# Patient Record
Sex: Female | Born: 1940 | Race: White | Hispanic: No | Smoking: Former smoker
Health system: Southern US, Community
[De-identification: ages and names within clinical notes are randomized; demographics above are authoritative.]

## PROBLEM LIST (undated history)

## (undated) DIAGNOSIS — G629 Polyneuropathy, unspecified: Secondary | ICD-10-CM

## (undated) DIAGNOSIS — F329 Major depressive disorder, single episode, unspecified: Secondary | ICD-10-CM

## (undated) DIAGNOSIS — C4491 Basal cell carcinoma of skin, unspecified: Secondary | ICD-10-CM

## (undated) DIAGNOSIS — E559 Vitamin D deficiency, unspecified: Secondary | ICD-10-CM

## (undated) DIAGNOSIS — F32A Depression, unspecified: Secondary | ICD-10-CM

## (undated) DIAGNOSIS — E039 Hypothyroidism, unspecified: Secondary | ICD-10-CM

## (undated) DIAGNOSIS — G250 Essential tremor: Secondary | ICD-10-CM

## (undated) DIAGNOSIS — F988 Other specified behavioral and emotional disorders with onset usually occurring in childhood and adolescence: Secondary | ICD-10-CM

## (undated) DIAGNOSIS — M8580 Other specified disorders of bone density and structure, unspecified site: Secondary | ICD-10-CM

## (undated) DIAGNOSIS — K5792 Diverticulitis of intestine, part unspecified, without perforation or abscess without bleeding: Secondary | ICD-10-CM

## (undated) HISTORY — PX: APPENDECTOMY: SHX54

## (undated) HISTORY — DX: Polyneuropathy, unspecified: G62.9

## (undated) HISTORY — DX: Hypothyroidism, unspecified: E03.9

## (undated) HISTORY — DX: Basal cell carcinoma of skin, unspecified: C44.91

## (undated) HISTORY — DX: Diverticulitis of intestine, part unspecified, without perforation or abscess without bleeding: K57.92

## (undated) HISTORY — DX: Vitamin D deficiency, unspecified: E55.9

## (undated) HISTORY — DX: Essential tremor: G25.0

## (undated) HISTORY — PX: BREAST BIOPSY: SHX20

## (undated) HISTORY — DX: Depression, unspecified: F32.A

## (undated) HISTORY — PX: CHOLECYSTECTOMY: SHX55

## (undated) HISTORY — DX: Other specified behavioral and emotional disorders with onset usually occurring in childhood and adolescence: F98.8

## (undated) HISTORY — DX: Other specified disorders of bone density and structure, unspecified site: M85.80

## (undated) HISTORY — PX: TONSILLECTOMY: SUR1361

## (undated) HISTORY — DX: Major depressive disorder, single episode, unspecified: F32.9

---
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
## 1983-09-11 HISTORY — PX: CHOLECYSTECTOMY: SHX55

## 1999-10-11 ENCOUNTER — Encounter: Admission: RE | Admit: 1999-10-11 | Discharge: 1999-10-11 | Payer: Self-pay | Admitting: Gynecology

## 1999-10-11 ENCOUNTER — Encounter: Payer: Self-pay | Admitting: Gynecology

## 2000-03-12 ENCOUNTER — Encounter: Payer: Self-pay | Admitting: Gynecology

## 2000-03-12 ENCOUNTER — Encounter: Admission: RE | Admit: 2000-03-12 | Discharge: 2000-03-12 | Payer: Self-pay | Admitting: Gynecology

## 2001-02-26 ENCOUNTER — Encounter: Admission: RE | Admit: 2001-02-26 | Discharge: 2001-02-26 | Payer: Self-pay | Admitting: Gynecology

## 2001-02-26 ENCOUNTER — Encounter: Payer: Self-pay | Admitting: Gynecology

## 2002-07-15 ENCOUNTER — Encounter: Payer: Self-pay | Admitting: Gynecology

## 2002-07-15 ENCOUNTER — Encounter: Admission: RE | Admit: 2002-07-15 | Discharge: 2002-07-15 | Payer: Self-pay | Admitting: Gynecology

## 2003-06-29 ENCOUNTER — Other Ambulatory Visit: Admission: RE | Admit: 2003-06-29 | Discharge: 2003-06-29 | Payer: Self-pay | Admitting: Gynecology

## 2003-10-01 ENCOUNTER — Encounter: Admission: RE | Admit: 2003-10-01 | Discharge: 2003-10-01 | Payer: Self-pay | Admitting: Gynecology

## 2004-05-24 ENCOUNTER — Ambulatory Visit (HOSPITAL_COMMUNITY): Admission: RE | Admit: 2004-05-24 | Discharge: 2004-05-24 | Payer: Self-pay | Admitting: Gastroenterology

## 2004-06-29 ENCOUNTER — Other Ambulatory Visit: Admission: RE | Admit: 2004-06-29 | Discharge: 2004-06-29 | Payer: Self-pay | Admitting: Gynecology

## 2004-10-02 ENCOUNTER — Other Ambulatory Visit: Admission: RE | Admit: 2004-10-02 | Discharge: 2004-10-02 | Payer: Self-pay | Admitting: Gynecology

## 2004-10-03 ENCOUNTER — Encounter: Admission: RE | Admit: 2004-10-03 | Discharge: 2004-10-03 | Payer: Self-pay | Admitting: Gynecology

## 2005-07-03 ENCOUNTER — Other Ambulatory Visit: Admission: RE | Admit: 2005-07-03 | Discharge: 2005-07-03 | Payer: Self-pay | Admitting: Gynecology

## 2005-10-04 ENCOUNTER — Encounter: Admission: RE | Admit: 2005-10-04 | Discharge: 2005-10-04 | Payer: Self-pay | Admitting: Gynecology

## 2006-10-25 ENCOUNTER — Encounter: Admission: RE | Admit: 2006-10-25 | Discharge: 2006-10-25 | Payer: Self-pay | Admitting: Gynecology

## 2007-05-26 ENCOUNTER — Emergency Department (HOSPITAL_COMMUNITY): Admission: EM | Admit: 2007-05-26 | Discharge: 2007-05-27 | Payer: Self-pay | Admitting: Emergency Medicine

## 2007-11-18 ENCOUNTER — Encounter: Admission: RE | Admit: 2007-11-18 | Discharge: 2007-11-18 | Payer: Self-pay | Admitting: Gynecology

## 2008-12-02 ENCOUNTER — Encounter: Admission: RE | Admit: 2008-12-02 | Discharge: 2008-12-02 | Payer: Self-pay | Admitting: Gynecology

## 2009-05-25 ENCOUNTER — Other Ambulatory Visit: Admission: RE | Admit: 2009-05-25 | Discharge: 2009-05-25 | Payer: Self-pay | Admitting: Gynecology

## 2009-05-25 ENCOUNTER — Ambulatory Visit: Payer: Self-pay | Admitting: Gynecology

## 2009-05-25 ENCOUNTER — Encounter: Payer: Self-pay | Admitting: Gynecology

## 2009-06-07 ENCOUNTER — Ambulatory Visit: Payer: Self-pay | Admitting: Gynecology

## 2009-08-19 ENCOUNTER — Ambulatory Visit: Payer: Self-pay | Admitting: Gynecology

## 2009-11-04 ENCOUNTER — Ambulatory Visit: Payer: Self-pay | Admitting: Gynecology

## 2009-12-05 ENCOUNTER — Encounter: Admission: RE | Admit: 2009-12-05 | Discharge: 2009-12-05 | Payer: Self-pay | Admitting: Gynecology

## 2010-10-01 ENCOUNTER — Encounter: Payer: Self-pay | Admitting: Gynecology

## 2010-12-20 ENCOUNTER — Other Ambulatory Visit: Payer: Self-pay | Admitting: Gynecology

## 2010-12-20 DIAGNOSIS — Z1231 Encounter for screening mammogram for malignant neoplasm of breast: Secondary | ICD-10-CM

## 2010-12-28 ENCOUNTER — Ambulatory Visit
Admission: RE | Admit: 2010-12-28 | Discharge: 2010-12-28 | Disposition: A | Discharge status: Home / Self Care | Payer: Medicare Other | Source: Ambulatory Visit | Attending: Gynecology | Admitting: Gynecology

## 2010-12-28 DIAGNOSIS — Z1231 Encounter for screening mammogram for malignant neoplasm of breast: Secondary | ICD-10-CM

## 2011-09-21 DIAGNOSIS — M545 Low back pain, unspecified: Secondary | ICD-10-CM | POA: Diagnosis not present

## 2011-09-21 DIAGNOSIS — E559 Vitamin D deficiency, unspecified: Secondary | ICD-10-CM | POA: Diagnosis not present

## 2011-09-21 DIAGNOSIS — F339 Major depressive disorder, recurrent, unspecified: Secondary | ICD-10-CM | POA: Diagnosis not present

## 2011-10-15 DIAGNOSIS — H2510 Age-related nuclear cataract, unspecified eye: Secondary | ICD-10-CM | POA: Diagnosis not present

## 2011-10-19 DIAGNOSIS — F331 Major depressive disorder, recurrent, moderate: Secondary | ICD-10-CM | POA: Diagnosis not present

## 2011-11-20 ENCOUNTER — Other Ambulatory Visit: Payer: Self-pay | Admitting: Gynecology

## 2011-11-20 DIAGNOSIS — Z1231 Encounter for screening mammogram for malignant neoplasm of breast: Secondary | ICD-10-CM

## 2011-12-12 DIAGNOSIS — F331 Major depressive disorder, recurrent, moderate: Secondary | ICD-10-CM | POA: Diagnosis not present

## 2011-12-31 ENCOUNTER — Ambulatory Visit
Admission: RE | Admit: 2011-12-31 | Discharge: 2011-12-31 | Disposition: A | Discharge status: Home / Self Care | Payer: PRIVATE HEALTH INSURANCE | Source: Ambulatory Visit | Attending: Gynecology | Admitting: Gynecology

## 2011-12-31 DIAGNOSIS — Z1231 Encounter for screening mammogram for malignant neoplasm of breast: Secondary | ICD-10-CM

## 2012-01-18 DIAGNOSIS — F331 Major depressive disorder, recurrent, moderate: Secondary | ICD-10-CM | POA: Diagnosis not present

## 2012-03-26 DIAGNOSIS — F331 Major depressive disorder, recurrent, moderate: Secondary | ICD-10-CM | POA: Diagnosis not present

## 2012-05-06 DIAGNOSIS — R110 Nausea: Secondary | ICD-10-CM | POA: Diagnosis not present

## 2012-07-16 DIAGNOSIS — F331 Major depressive disorder, recurrent, moderate: Secondary | ICD-10-CM | POA: Diagnosis not present

## 2012-07-23 DIAGNOSIS — Z23 Encounter for immunization: Secondary | ICD-10-CM | POA: Diagnosis not present

## 2012-07-27 DIAGNOSIS — R300 Dysuria: Secondary | ICD-10-CM | POA: Diagnosis not present

## 2012-07-27 DIAGNOSIS — N3289 Other specified disorders of bladder: Secondary | ICD-10-CM | POA: Diagnosis not present

## 2012-07-27 DIAGNOSIS — R350 Frequency of micturition: Secondary | ICD-10-CM | POA: Diagnosis not present

## 2012-08-14 DIAGNOSIS — F331 Major depressive disorder, recurrent, moderate: Secondary | ICD-10-CM | POA: Diagnosis not present

## 2012-10-06 ENCOUNTER — Encounter: Payer: Self-pay | Admitting: Gynecology

## 2012-10-06 DIAGNOSIS — G250 Essential tremor: Secondary | ICD-10-CM | POA: Insufficient documentation

## 2012-10-06 DIAGNOSIS — F331 Major depressive disorder, recurrent, moderate: Secondary | ICD-10-CM | POA: Insufficient documentation

## 2012-10-08 DIAGNOSIS — R209 Unspecified disturbances of skin sensation: Secondary | ICD-10-CM | POA: Diagnosis not present

## 2012-10-09 DIAGNOSIS — F331 Major depressive disorder, recurrent, moderate: Secondary | ICD-10-CM | POA: Diagnosis not present

## 2012-10-14 ENCOUNTER — Ambulatory Visit (INDEPENDENT_AMBULATORY_CARE_PROVIDER_SITE_OTHER): Payer: Medicare Other | Admitting: Gynecology

## 2012-10-14 ENCOUNTER — Encounter: Payer: Self-pay | Admitting: Gynecology

## 2012-10-14 VITALS — BP 120/70 | Ht 66.0 in | Wt 158.0 lb

## 2012-10-14 DIAGNOSIS — E559 Vitamin D deficiency, unspecified: Secondary | ICD-10-CM

## 2012-10-14 DIAGNOSIS — Z803 Family history of malignant neoplasm of breast: Secondary | ICD-10-CM

## 2012-10-14 DIAGNOSIS — N952 Postmenopausal atrophic vaginitis: Secondary | ICD-10-CM

## 2012-10-14 DIAGNOSIS — H35379 Puckering of macula, unspecified eye: Secondary | ICD-10-CM | POA: Diagnosis not present

## 2012-10-14 NOTE — Progress Notes (Signed)
Katrina Gamble November 06, 1940 [id number]        72 y.o.  G3P3003 for follow up exam.  Has not been in the office for a number of years.  Several issues noted below.  Past medical history,surgical history, medications, allergies, family history and social history were all reviewed and documented in the EPIC chart. ROS:  Was performed and pertinent positives and negatives are included in the history.  Exam: Kim assistant Filed Vitals:   10/14/12 1449  BP: 120/70  Height: 5\' 6"  (1.676 m)  Weight: 158 lb (71.668 kg)   General appearance  Normal Skin grossly normal Head/Neck normal with no cervical or supraclavicular adenopathy thyroid normal Lungs  clear Cardiac RR, without RMG Abdominal  soft, nontender, without masses, organomegaly or hernia Breasts  examined lying and sitting without masses, retractions, discharge or axillary adenopathy. Pelvic  Ext/BUS/vagina  normal with atrophic changes  Cervix  normal with atrophic changes  Uterus  axial, normal size, shape and contour, midline and mobile nontender   Adnexa  Without masses or tenderness    Anus and perineum  normal   Rectovaginal  normal sphincter tone without palpated masses or tenderness.    Assessment/Plan:  72 y.o. G3P3003 female for follow up exam.   1. Postmenopausal/atrophic vaginal changes. Patient's not sexually active. Not significantly symptomatic. Plan observation at present. 2. History of breast cancer and her mother age 30 and and her daughter age 26. Patient's daughter was checked for BRCA gene testing and was negative.  Mammogram 12/2011. Patient will schedule this coming April. SBE monthly reviewed. 3. Pap smear 2010. No Pap smear done today.  No history of significant abnormal Pap smears. Plan to stop screening per current screening guidelines and patients comfortable with this. 4. DEXA 2010 normal. Plan repeat next year at five-year interval. Increase calcium vitamin D reviewed. Patient has been known to be  vitamin D deficient with values checked in the 20 range. Recommend repeat vitamin D now patients could arrange at her primary physician's office who she has made an appointment to see this coming month. 5. Colonoscopy 2005. Recommend repeat this coming year patient agrees to do so. 6. Health maintenance. No lab work done as it's going to be all done at her primary physician's office. Follow up one year, sooner as needed.    Dara Lords MD, 3:23 PM 10/14/2012

## 2012-10-14 NOTE — Patient Instructions (Signed)
Follow-up in one year.

## 2012-10-30 ENCOUNTER — Other Ambulatory Visit: Payer: Self-pay | Admitting: Family Medicine

## 2012-10-30 DIAGNOSIS — M79604 Pain in right leg: Secondary | ICD-10-CM

## 2012-10-30 DIAGNOSIS — M545 Low back pain, unspecified: Secondary | ICD-10-CM

## 2012-10-30 DIAGNOSIS — R202 Paresthesia of skin: Secondary | ICD-10-CM

## 2012-11-06 ENCOUNTER — Ambulatory Visit
Admission: RE | Admit: 2012-11-06 | Discharge: 2012-11-06 | Disposition: A | Discharge status: Home / Self Care | Payer: Medicare Other | Source: Ambulatory Visit | Attending: Family Medicine | Admitting: Family Medicine

## 2012-11-06 DIAGNOSIS — M48061 Spinal stenosis, lumbar region without neurogenic claudication: Secondary | ICD-10-CM | POA: Diagnosis not present

## 2012-11-06 DIAGNOSIS — Z0000 Encounter for general adult medical examination without abnormal findings: Secondary | ICD-10-CM | POA: Diagnosis not present

## 2012-11-06 DIAGNOSIS — IMO0002 Reserved for concepts with insufficient information to code with codable children: Secondary | ICD-10-CM | POA: Diagnosis not present

## 2012-11-06 DIAGNOSIS — J309 Allergic rhinitis, unspecified: Secondary | ICD-10-CM | POA: Diagnosis not present

## 2012-11-06 DIAGNOSIS — F339 Major depressive disorder, recurrent, unspecified: Secondary | ICD-10-CM | POA: Diagnosis not present

## 2012-11-06 DIAGNOSIS — Z79899 Other long term (current) drug therapy: Secondary | ICD-10-CM | POA: Diagnosis not present

## 2012-11-06 DIAGNOSIS — E559 Vitamin D deficiency, unspecified: Secondary | ICD-10-CM | POA: Diagnosis not present

## 2012-11-06 DIAGNOSIS — M5126 Other intervertebral disc displacement, lumbar region: Secondary | ICD-10-CM | POA: Diagnosis not present

## 2012-11-06 DIAGNOSIS — M545 Low back pain, unspecified: Secondary | ICD-10-CM

## 2012-11-06 DIAGNOSIS — M4120 Other idiopathic scoliosis, site unspecified: Secondary | ICD-10-CM | POA: Diagnosis not present

## 2012-11-06 DIAGNOSIS — R202 Paresthesia of skin: Secondary | ICD-10-CM

## 2012-11-06 DIAGNOSIS — Z1331 Encounter for screening for depression: Secondary | ICD-10-CM | POA: Diagnosis not present

## 2012-11-06 DIAGNOSIS — H6120 Impacted cerumen, unspecified ear: Secondary | ICD-10-CM | POA: Diagnosis not present

## 2012-11-06 DIAGNOSIS — M79604 Pain in right leg: Secondary | ICD-10-CM

## 2012-11-10 DIAGNOSIS — J309 Allergic rhinitis, unspecified: Secondary | ICD-10-CM | POA: Diagnosis not present

## 2012-11-10 DIAGNOSIS — Z79899 Other long term (current) drug therapy: Secondary | ICD-10-CM | POA: Diagnosis not present

## 2012-11-10 DIAGNOSIS — R300 Dysuria: Secondary | ICD-10-CM | POA: Diagnosis not present

## 2012-11-10 DIAGNOSIS — E559 Vitamin D deficiency, unspecified: Secondary | ICD-10-CM | POA: Diagnosis not present

## 2012-11-12 DIAGNOSIS — M47817 Spondylosis without myelopathy or radiculopathy, lumbosacral region: Secondary | ICD-10-CM | POA: Diagnosis not present

## 2012-11-12 DIAGNOSIS — M5137 Other intervertebral disc degeneration, lumbosacral region: Secondary | ICD-10-CM | POA: Diagnosis not present

## 2012-12-02 ENCOUNTER — Other Ambulatory Visit: Payer: Self-pay

## 2012-12-02 DIAGNOSIS — Z1231 Encounter for screening mammogram for malignant neoplasm of breast: Secondary | ICD-10-CM

## 2012-12-25 DIAGNOSIS — F331 Major depressive disorder, recurrent, moderate: Secondary | ICD-10-CM | POA: Diagnosis not present

## 2012-12-31 ENCOUNTER — Ambulatory Visit
Admission: RE | Admit: 2012-12-31 | Discharge: 2012-12-31 | Disposition: A | Discharge status: Home / Self Care | Payer: Medicare Other | Source: Ambulatory Visit

## 2012-12-31 DIAGNOSIS — Z1231 Encounter for screening mammogram for malignant neoplasm of breast: Secondary | ICD-10-CM | POA: Diagnosis not present

## 2013-02-19 DIAGNOSIS — F331 Major depressive disorder, recurrent, moderate: Secondary | ICD-10-CM | POA: Diagnosis not present

## 2013-04-14 DIAGNOSIS — F331 Major depressive disorder, recurrent, moderate: Secondary | ICD-10-CM | POA: Diagnosis not present

## 2013-05-26 DIAGNOSIS — J069 Acute upper respiratory infection, unspecified: Secondary | ICD-10-CM | POA: Diagnosis not present

## 2013-06-26 DIAGNOSIS — Z23 Encounter for immunization: Secondary | ICD-10-CM | POA: Diagnosis not present

## 2013-07-07 DIAGNOSIS — F331 Major depressive disorder, recurrent, moderate: Secondary | ICD-10-CM | POA: Diagnosis not present

## 2013-08-16 DIAGNOSIS — R300 Dysuria: Secondary | ICD-10-CM | POA: Diagnosis not present

## 2013-08-26 DIAGNOSIS — M25539 Pain in unspecified wrist: Secondary | ICD-10-CM | POA: Diagnosis not present

## 2013-09-22 DIAGNOSIS — F331 Major depressive disorder, recurrent, moderate: Secondary | ICD-10-CM | POA: Diagnosis not present

## 2013-10-15 DIAGNOSIS — H2510 Age-related nuclear cataract, unspecified eye: Secondary | ICD-10-CM | POA: Diagnosis not present

## 2013-11-04 ENCOUNTER — Ambulatory Visit (INDEPENDENT_AMBULATORY_CARE_PROVIDER_SITE_OTHER): Payer: Medicare Other | Admitting: Gynecology

## 2013-11-04 ENCOUNTER — Encounter: Payer: Self-pay | Admitting: Gynecology

## 2013-11-04 VITALS — BP 116/70 | Ht 65.0 in | Wt 144.0 lb

## 2013-11-04 DIAGNOSIS — N9489 Other specified conditions associated with female genital organs and menstrual cycle: Secondary | ICD-10-CM

## 2013-11-04 DIAGNOSIS — N898 Other specified noninflammatory disorders of vagina: Secondary | ICD-10-CM

## 2013-11-04 DIAGNOSIS — E559 Vitamin D deficiency, unspecified: Secondary | ICD-10-CM

## 2013-11-04 DIAGNOSIS — Z803 Family history of malignant neoplasm of breast: Secondary | ICD-10-CM

## 2013-11-04 DIAGNOSIS — N952 Postmenopausal atrophic vaginitis: Secondary | ICD-10-CM

## 2013-11-04 NOTE — Progress Notes (Signed)
Katrina Gamble 02-04-1941 [id number]        72 y.o.  G3P3003 for followup exam.  Overall doing well with several issues that are below.  Past medical history,surgical history, problem list, medications, allergies, family history and social history were all reviewed and documented in the EPIC chart.  ROS:  Performed and pertinent positives and negatives are included in the history, assessment and plan .  Exam: Kim assistant Filed Vitals:   11/04/13 1137  BP: 116/70  Height: $Remove'5\' 5"'MFMWAEG$  (1.651 m)  Weight: 144 lb (65.318 kg)   General appearance  Normal Skin grossly normal Head/Neck normal with no cervical or supraclavicular adenopathy thyroid normal Lungs  clear Cardiac RR, without RMG Abdominal  soft, nontender, without masses, organomegaly or hernia Breasts  examined lying and sitting without masses, retractions, discharge or axillary adenopathy. Pelvic  Ext/BUS/vagina with generalized atrophic changes  Cervix atrophic  Uterus anteverted, normal size, shape and contour, midline and mobile nontender   Adnexa  Without masses or tenderness    Anus and perineum  Normal   Rectovaginal  Normal sphincter tone without palpated masses or tenderness.    Assessment/Plan:  73 y.o. G44P3003 female for followup exam.   1. Postmenopausal/atrophic genital changes/vaginal dryness. No bleeding significant hot flushes or night sweats. He is having issues with vaginal dryness. Options for OTC products reviewed. Not an issue that she wants to consider prescription medications. We'll monitor at present. Call if any vaginal bleeding. 2. Pap smear 2010. No Pap smear done today. No history of abnormal Pap smears previously. We have elected to stop screening per current screening guidelines as she is over the age of 69 and she is comfortable with this. 3. Mammography 2014. Repeat this coming April and due to. Mother and daughter with breast cancer. BRCA testing negative and her daughter. Continue with annual  mammography. SBE monthly reviewed. 4. DEXA fall of 2010 normal. Repeat next year at 5 year interval. Calcium/vitamin D recommendations reviewed. She is being followed for vitamin D deficiency by her primary physician and is on vitamin D supplements. 5. Colonoscopy due this year and she knows to call and arrange for this. 6. Health maintenance. No routine blood work done as it is all done through her primary physician's office. Followup one year, sooner as needed.   Note: This document was prepared with digital dictation and possible smart phrase technology. Any transcriptional errors that result from this process are unintentional.   [name] MD, 12:11 PM 11/04/2013

## 2013-11-04 NOTE — Patient Instructions (Signed)
Followup in one year for annual exam, sooner if any issues 

## 2013-11-17 DIAGNOSIS — F331 Major depressive disorder, recurrent, moderate: Secondary | ICD-10-CM | POA: Diagnosis not present

## 2013-12-03 ENCOUNTER — Other Ambulatory Visit: Payer: Self-pay

## 2013-12-03 DIAGNOSIS — Z1231 Encounter for screening mammogram for malignant neoplasm of breast: Secondary | ICD-10-CM

## 2014-01-04 ENCOUNTER — Encounter (INDEPENDENT_AMBULATORY_CARE_PROVIDER_SITE_OTHER): Payer: Self-pay

## 2014-01-04 ENCOUNTER — Ambulatory Visit
Admission: RE | Admit: 2014-01-04 | Discharge: 2014-01-04 | Disposition: A | Discharge status: Home / Self Care | Payer: Medicare Other | Source: Ambulatory Visit

## 2014-01-04 DIAGNOSIS — Z1231 Encounter for screening mammogram for malignant neoplasm of breast: Secondary | ICD-10-CM | POA: Diagnosis not present

## 2014-02-09 DIAGNOSIS — F331 Major depressive disorder, recurrent, moderate: Secondary | ICD-10-CM | POA: Diagnosis not present

## 2014-03-10 DIAGNOSIS — Z23 Encounter for immunization: Secondary | ICD-10-CM | POA: Diagnosis not present

## 2014-03-10 DIAGNOSIS — M25569 Pain in unspecified knee: Secondary | ICD-10-CM | POA: Diagnosis not present

## 2014-03-16 DIAGNOSIS — IMO0002 Reserved for concepts with insufficient information to code with codable children: Secondary | ICD-10-CM | POA: Diagnosis not present

## 2014-03-16 DIAGNOSIS — M25569 Pain in unspecified knee: Secondary | ICD-10-CM | POA: Diagnosis not present

## 2014-04-06 DIAGNOSIS — M25569 Pain in unspecified knee: Secondary | ICD-10-CM | POA: Diagnosis not present

## 2014-04-11 DIAGNOSIS — M1710 Unilateral primary osteoarthritis, unspecified knee: Secondary | ICD-10-CM | POA: Diagnosis not present

## 2014-06-01 DIAGNOSIS — F331 Major depressive disorder, recurrent, moderate: Secondary | ICD-10-CM | POA: Diagnosis not present

## 2014-06-22 DIAGNOSIS — Z23 Encounter for immunization: Secondary | ICD-10-CM | POA: Diagnosis not present

## 2014-07-12 ENCOUNTER — Encounter: Payer: Self-pay | Admitting: Gynecology

## 2014-07-27 DIAGNOSIS — F331 Major depressive disorder, recurrent, moderate: Secondary | ICD-10-CM | POA: Diagnosis not present

## 2014-08-27 DIAGNOSIS — R202 Paresthesia of skin: Secondary | ICD-10-CM | POA: Diagnosis not present

## 2014-08-27 DIAGNOSIS — E559 Vitamin D deficiency, unspecified: Secondary | ICD-10-CM | POA: Diagnosis not present

## 2014-08-27 DIAGNOSIS — R944 Abnormal results of kidney function studies: Secondary | ICD-10-CM | POA: Diagnosis not present

## 2014-09-15 DIAGNOSIS — R21 Rash and other nonspecific skin eruption: Secondary | ICD-10-CM | POA: Diagnosis not present

## 2014-10-14 DIAGNOSIS — R197 Diarrhea, unspecified: Secondary | ICD-10-CM | POA: Diagnosis not present

## 2014-10-14 DIAGNOSIS — Z1211 Encounter for screening for malignant neoplasm of colon: Secondary | ICD-10-CM | POA: Diagnosis not present

## 2014-10-18 DIAGNOSIS — H2513 Age-related nuclear cataract, bilateral: Secondary | ICD-10-CM | POA: Diagnosis not present

## 2014-10-19 DIAGNOSIS — F331 Major depressive disorder, recurrent, moderate: Secondary | ICD-10-CM | POA: Diagnosis not present

## 2014-11-05 ENCOUNTER — Encounter: Payer: Self-pay | Admitting: Gynecology

## 2014-11-05 ENCOUNTER — Ambulatory Visit (INDEPENDENT_AMBULATORY_CARE_PROVIDER_SITE_OTHER): Payer: Medicare Other | Admitting: Gynecology

## 2014-11-05 VITALS — BP 124/78 | Ht 66.0 in | Wt 145.0 lb

## 2014-11-05 DIAGNOSIS — E559 Vitamin D deficiency, unspecified: Secondary | ICD-10-CM | POA: Diagnosis not present

## 2014-11-05 DIAGNOSIS — N952 Postmenopausal atrophic vaginitis: Secondary | ICD-10-CM

## 2014-11-05 DIAGNOSIS — Z01419 Encounter for gynecological examination (general) (routine) without abnormal findings: Secondary | ICD-10-CM | POA: Diagnosis not present

## 2014-11-05 NOTE — Patient Instructions (Addendum)
Follow up for bone density as scheduled.  You may obtain a copy of any labs that were done today by logging onto MyChart as outlined in the instructions provided with your AVS (after visit summary). The office will not call with normal lab results but certainly if there are any significant abnormalities then we will contact you.   Health Maintenance, Female A healthy lifestyle and preventative care can promote health and wellness.  Maintain regular health, dental, and eye exams.  Eat a healthy diet. Foods like vegetables, fruits, whole grains, low-fat dairy products, and lean protein foods contain the nutrients you need without too many calories. Decrease your intake of foods high in solid fats, added sugars, and salt. Get information about a proper diet from your caregiver, if necessary.  Regular physical exercise is one of the most important things you can do for your health. Most adults should get at least 150 minutes of moderate-intensity exercise (any activity that increases your heart rate and causes you to sweat) each week. In addition, most adults need muscle-strengthening exercises on 2 or more days a week.   Maintain a healthy weight. The body mass index (BMI) is a screening tool to identify possible weight problems. It provides an estimate of body fat based on height and weight. Your caregiver can help determine your BMI, and can help you achieve or maintain a healthy weight. For adults 20 years and older:  A BMI below 18.5 is considered underweight.  A BMI of 18.5 to 24.9 is normal.  A BMI of 25 to 29.9 is considered overweight.  A BMI of 30 and above is considered obese.  Maintain normal blood lipids and cholesterol by exercising and minimizing your intake of saturated fat. Eat a balanced diet with plenty of fruits and vegetables. Blood tests for lipids and cholesterol should begin at age 20 and be repeated every 5 years. If your lipid or cholesterol levels are high, you are over  50, or you are a high risk for heart disease, you may need your cholesterol levels checked more frequently.Ongoing high lipid and cholesterol levels should be treated with medicines if diet and exercise are not effective.  If you smoke, find out from your caregiver how to quit. If you do not use tobacco, do not start.  Lung cancer screening is recommended for adults aged 55 80 years who are at high risk for developing lung cancer because of a history of smoking. Yearly low-dose computed tomography (CT) is recommended for people who have at least a 30-pack-year history of smoking and are a current smoker or have quit within the past 15 years. A pack year of smoking is smoking an average of 1 pack of cigarettes a day for 1 year (for example: 1 pack a day for 30 years or 2 packs a day for 15 years). Yearly screening should continue until the smoker has stopped smoking for at least 15 years. Yearly screening should also be stopped for people who develop a health problem that would prevent them from having lung cancer treatment.  If you are pregnant, do not drink alcohol. If you are breastfeeding, be very cautious about drinking alcohol. If you are not pregnant and choose to drink alcohol, do not exceed 1 drink per day. One drink is considered to be 12 ounces (355 mL) of beer, 5 ounces (148 mL) of wine, or 1.5 ounces (44 mL) of liquor.  Avoid use of street drugs. Do not share needles with anyone. Ask for help   help if you need support or instructions about stopping the use of drugs.  High blood pressure causes heart disease and increases the risk of stroke. Blood pressure should be checked at least every 1 to 2 years. Ongoing high blood pressure should be treated with medicines, if weight loss and exercise are not effective.  If you are 55 to 72 years old, ask your caregiver if you should take aspirin to prevent strokes.  Diabetes screening involves taking a blood sample to check your fasting blood sugar  level. This should be done once every 3 years, after age 45, if you are within normal weight and without risk factors for diabetes. Testing should be considered at a younger age or be carried out more frequently if you are overweight and have at least 1 risk factor for diabetes.  Breast cancer screening is essential preventative care for women. You should practice "breast self-awareness." This means understanding the normal appearance and feel of your breasts and may include breast self-examination. Any changes detected, no matter how small, should be reported to a caregiver. Women in their 20s and 30s should have a clinical breast exam (CBE) by a caregiver as part of a regular health exam every 1 to 3 years. After age 40, women should have a CBE every year. Starting at age 40, women should consider having a mammogram (breast X-ray) every year. Women who have a family history of breast cancer should talk to their caregiver about genetic screening. Women at a high risk of breast cancer should talk to their caregiver about having an MRI and a mammogram every year.  Breast cancer gene (BRCA)-related cancer risk assessment is recommended for women who have family members with BRCA-related cancers. BRCA-related cancers include breast, ovarian, tubal, and peritoneal cancers. Having family members with these cancers may be associated with an increased risk for harmful changes (mutations) in the breast cancer genes BRCA1 and BRCA2. Results of the assessment will determine the need for genetic counseling and BRCA1 and BRCA2 testing.  The Pap test is a screening test for cervical cancer. Women should have a Pap test starting at age 21. Between ages 21 and 29, Pap tests should be repeated every 2 years. Beginning at age 30, you should have a Pap test every 3 years as long as the past 3 Pap tests have been normal. If you had a hysterectomy for a problem that was not cancer or a condition that could lead to cancer, then  you no longer need Pap tests. If you are between ages 65 and 70, and you have had normal Pap tests going back 10 years, you no longer need Pap tests. If you have had past treatment for cervical cancer or a condition that could lead to cancer, you need Pap tests and screening for cancer for at least 20 years after your treatment. If Pap tests have been discontinued, risk factors (such as a new sexual partner) need to be reassessed to determine if screening should be resumed. Some women have medical problems that increase the chance of getting cervical cancer. In these cases, your caregiver may recommend more frequent screening and Pap tests.  The human papillomavirus (HPV) test is an additional test that may be used for cervical cancer screening. The HPV test looks for the virus that can cause the cell changes on the cervix. The cells collected during the Pap test can be tested for HPV. The HPV test could be used to screen women aged 30 years and older,   and should be used in women of any age who have unclear Pap test results. After the age of 30, women should have HPV testing at the same frequency as a Pap test.  Colorectal cancer can be detected and often prevented. Most routine colorectal cancer screening begins at the age of 50 and continues through age 75. However, your caregiver may recommend screening at an earlier age if you have risk factors for colon cancer. On a yearly basis, your caregiver may provide home test kits to check for hidden blood in the stool. Use of a small camera at the end of a tube, to directly examine the colon (sigmoidoscopy or colonoscopy), can detect the earliest forms of colorectal cancer. Talk to your caregiver about this at age 50, when routine screening begins. Direct examination of the colon should be repeated every 5 to 10 years through age 75, unless early forms of pre-cancerous polyps or small growths are found.  Hepatitis C blood testing is recommended for all people born  from 1945 through 1965 and any individual with known risks for hepatitis C.  Practice safe sex. Use condoms and avoid high-risk sexual practices to reduce the spread of sexually transmitted infections (STIs). Sexually active women aged 25 and younger should be checked for Chlamydia, which is a common sexually transmitted infection. Older women with new or multiple partners should also be tested for Chlamydia. Testing for other STIs is recommended if you are sexually active and at increased risk.  Osteoporosis is a disease in which the bones lose minerals and strength with aging. This can result in serious bone fractures. The risk of osteoporosis can be identified using a bone density scan. Women ages 65 and over and women at risk for fractures or osteoporosis should discuss screening with their caregivers. Ask your caregiver whether you should be taking a calcium supplement or vitamin D to reduce the rate of osteoporosis.  Menopause can be associated with physical symptoms and risks. Hormone replacement therapy is available to decrease symptoms and risks. You should talk to your caregiver about whether hormone replacement therapy is right for you.  Use sunscreen. Apply sunscreen liberally and repeatedly throughout the day. You should seek shade when your shadow is shorter than you. Protect yourself by wearing long sleeves, pants, a wide-brimmed hat, and sunglasses year round, whenever you are outdoors.  Notify your caregiver of new moles or changes in moles, especially if there is a change in shape or color. Also notify your caregiver if a mole is larger than the size of a pencil eraser.  Stay current with your immunizations. Document Released: 03/12/2011 Document Revised: 12/22/2012 Document Reviewed: 03/12/2011 [employer] Patient Information 2014 [employer].   

## 2014-11-05 NOTE — Progress Notes (Signed)
FRYDA MOLENDA March 24, 1941 [id number]        74 y.o.  G3P3003 for for breast and pelvic exam. Several issues noted below.  Past medical history,surgical history, problem list, medications, allergies, family history and social history were all reviewed and documented as reviewed in the EPIC chart.  ROS:  Performed with pertinent positives and negatives included in the history, assessment and plan.   Additional significant findings :  none   Exam: Kim Counsellor Vitals:   11/05/14 1029  BP: 124/78  Height: 5\' 6"  (1.676 m)  Weight: 145 lb (65.772 kg)   General appearance:  Normal affect, orientation and appearance. Skin: Grossly normal HEENT: Without gross lesions.  No cervical or supraclavicular adenopathy. Thyroid normal.  Lungs:  Clear without wheezing, rales or rhonchi Cardiac: RR, without RMG Abdominal:  Soft, nontender, without masses, guarding, rebound, organomegaly or hernia Breasts:  Examined lying and sitting without masses, retractions, discharge or axillary adenopathy. Pelvic:  Ext/BUS/vagina with generalized atrophic changes.  Cervix with atrophic changes.  Uterus axial to anteverted, normal size, shape and contour, midline and mobile nontender   Adnexa  Without masses or tenderness    Anus and perineum  Normal   Rectovaginal  Normal sphincter tone without palpated masses or tenderness.    Assessment/Plan:  74 y.o. G33P3003 female for annual exam.   1. Postmenopausal/atrophic genital changes. Patient without significant symptoms of hot flushes, night sweats, vaginal dryness. No vaginal bleeding. Continue monitoring report any vaginal bleeding. 2. Mammography 12/2013. Reminded patient to schedule this coming April. SBE monthly reviewed. 3. DEXA 2010 normal. Plan repeat DEXA now at 6 year interval. Has been treated for vitamin D deficiency with 50,000 units weekly and now is on 3000 units. Being followed by her primary for this. 4. Colonoscopy 2010. Repeat at  their recommended interval. 5. Pap smear 2010. No Pap smear done today. No history of abnormal Pap smears previously. Her current screening guidelines we both agreed to stop screening as she is over the age of 74. 68. Health maintenance. No routine blood work done as she reports this done at her primary physician's office. Follow up in one year, sooner as needed.     [name] MD, 11:06 AM 11/05/2014

## 2014-11-06 LAB — URINALYSIS W MICROSCOPIC + REFLEX CULTURE
Bacteria, UA: NONE SEEN
Bilirubin Urine: NEGATIVE
Casts: NONE SEEN
Crystals: NONE SEEN
Glucose, UA: NEGATIVE mg/dL
Hgb urine dipstick: NEGATIVE
Ketones, ur: NEGATIVE mg/dL
Leukocytes, UA: NEGATIVE
Nitrite: NEGATIVE
Protein, ur: NEGATIVE mg/dL
Specific Gravity, Urine: <1.005 — ABNORMAL LOW (ref 1.005–1.030)
Squamous Epithelial / LPF: NONE SEEN
Urobilinogen, UA: 0.2 mg/dL (ref 0.0–1.0)
pH: 6.5 (ref 5.0–8.0)

## 2014-11-09 DIAGNOSIS — M8580 Other specified disorders of bone density and structure, unspecified site: Secondary | ICD-10-CM

## 2014-11-09 HISTORY — DX: Other specified disorders of bone density and structure, unspecified site: M85.80

## 2014-11-16 ENCOUNTER — Encounter: Payer: Self-pay | Admitting: Gynecology

## 2014-11-16 ENCOUNTER — Ambulatory Visit (INDEPENDENT_AMBULATORY_CARE_PROVIDER_SITE_OTHER): Payer: Medicare Other

## 2014-11-16 ENCOUNTER — Other Ambulatory Visit: Payer: Self-pay | Admitting: Gynecology

## 2014-11-16 DIAGNOSIS — Z780 Asymptomatic menopausal state: Secondary | ICD-10-CM | POA: Diagnosis not present

## 2014-11-16 DIAGNOSIS — M8580 Other specified disorders of bone density and structure, unspecified site: Secondary | ICD-10-CM

## 2014-11-16 DIAGNOSIS — N952 Postmenopausal atrophic vaginitis: Secondary | ICD-10-CM | POA: Diagnosis not present

## 2014-11-16 DIAGNOSIS — Z01419 Encounter for gynecological examination (general) (routine) without abnormal findings: Secondary | ICD-10-CM

## 2014-11-29 DIAGNOSIS — D124 Benign neoplasm of descending colon: Secondary | ICD-10-CM | POA: Diagnosis not present

## 2014-11-29 DIAGNOSIS — K635 Polyp of colon: Secondary | ICD-10-CM | POA: Diagnosis not present

## 2014-11-29 DIAGNOSIS — K5730 Diverticulosis of large intestine without perforation or abscess without bleeding: Secondary | ICD-10-CM | POA: Diagnosis not present

## 2014-11-29 DIAGNOSIS — Z1211 Encounter for screening for malignant neoplasm of colon: Secondary | ICD-10-CM | POA: Diagnosis not present

## 2014-11-29 DIAGNOSIS — K648 Other hemorrhoids: Secondary | ICD-10-CM | POA: Diagnosis not present

## 2014-11-29 DIAGNOSIS — D125 Benign neoplasm of sigmoid colon: Secondary | ICD-10-CM | POA: Diagnosis not present

## 2015-01-26 ENCOUNTER — Other Ambulatory Visit: Payer: Self-pay

## 2015-01-26 DIAGNOSIS — Z1231 Encounter for screening mammogram for malignant neoplasm of breast: Secondary | ICD-10-CM

## 2015-02-01 ENCOUNTER — Ambulatory Visit
Admission: RE | Admit: 2015-02-01 | Discharge: 2015-02-01 | Disposition: A | Discharge status: Home / Self Care | Payer: Medicare Other | Source: Ambulatory Visit

## 2015-02-01 DIAGNOSIS — Z1231 Encounter for screening mammogram for malignant neoplasm of breast: Secondary | ICD-10-CM

## 2015-02-09 DIAGNOSIS — F331 Major depressive disorder, recurrent, moderate: Secondary | ICD-10-CM | POA: Diagnosis not present

## 2015-06-04 DIAGNOSIS — R40241 Glasgow coma scale score 13-15: Secondary | ICD-10-CM | POA: Diagnosis not present

## 2015-06-04 DIAGNOSIS — K7689 Other specified diseases of liver: Secondary | ICD-10-CM | POA: Diagnosis not present

## 2015-06-04 DIAGNOSIS — Z91040 Latex allergy status: Secondary | ICD-10-CM | POA: Diagnosis not present

## 2015-06-04 DIAGNOSIS — R1013 Epigastric pain: Secondary | ICD-10-CM | POA: Diagnosis not present

## 2015-06-04 DIAGNOSIS — S065X9A Traumatic subdural hemorrhage with loss of consciousness of unspecified duration, initial encounter: Secondary | ICD-10-CM | POA: Diagnosis not present

## 2015-06-04 DIAGNOSIS — S199XXA Unspecified injury of neck, initial encounter: Secondary | ICD-10-CM | POA: Diagnosis not present

## 2015-06-04 DIAGNOSIS — S065X0A Traumatic subdural hemorrhage without loss of consciousness, initial encounter: Secondary | ICD-10-CM | POA: Diagnosis not present

## 2015-06-04 DIAGNOSIS — S2220XA Unspecified fracture of sternum, initial encounter for closed fracture: Secondary | ICD-10-CM | POA: Diagnosis not present

## 2015-06-04 DIAGNOSIS — J9811 Atelectasis: Secondary | ICD-10-CM | POA: Diagnosis not present

## 2015-06-04 DIAGNOSIS — S299XXA Unspecified injury of thorax, initial encounter: Secondary | ICD-10-CM | POA: Diagnosis not present

## 2015-06-04 DIAGNOSIS — F909 Attention-deficit hyperactivity disorder, unspecified type: Secondary | ICD-10-CM | POA: Diagnosis present

## 2015-06-04 DIAGNOSIS — S20219A Contusion of unspecified front wall of thorax, initial encounter: Secondary | ICD-10-CM | POA: Diagnosis not present

## 2015-06-04 DIAGNOSIS — Z79899 Other long term (current) drug therapy: Secondary | ICD-10-CM | POA: Diagnosis not present

## 2015-06-04 DIAGNOSIS — S2222XA Fracture of body of sternum, initial encounter for closed fracture: Secondary | ICD-10-CM | POA: Diagnosis not present

## 2015-06-04 DIAGNOSIS — Z888 Allergy status to other drugs, medicaments and biological substances status: Secondary | ICD-10-CM | POA: Diagnosis not present

## 2015-06-04 DIAGNOSIS — F329 Major depressive disorder, single episode, unspecified: Secondary | ICD-10-CM | POA: Diagnosis not present

## 2015-06-04 DIAGNOSIS — N281 Cyst of kidney, acquired: Secondary | ICD-10-CM | POA: Diagnosis not present

## 2015-06-04 DIAGNOSIS — R51 Headache: Secondary | ICD-10-CM | POA: Diagnosis not present

## 2015-06-04 DIAGNOSIS — S80811A Abrasion, right lower leg, initial encounter: Secondary | ICD-10-CM | POA: Diagnosis not present

## 2015-06-04 DIAGNOSIS — I6200 Nontraumatic subdural hemorrhage, unspecified: Secondary | ICD-10-CM | POA: Diagnosis not present

## 2015-06-04 DIAGNOSIS — R938 Abnormal findings on diagnostic imaging of other specified body structures: Secondary | ICD-10-CM | POA: Diagnosis not present

## 2015-06-16 DIAGNOSIS — Z23 Encounter for immunization: Secondary | ICD-10-CM | POA: Diagnosis not present

## 2015-06-16 DIAGNOSIS — S2220XA Unspecified fracture of sternum, initial encounter for closed fracture: Secondary | ICD-10-CM | POA: Diagnosis not present

## 2015-06-16 DIAGNOSIS — I6200 Nontraumatic subdural hemorrhage, unspecified: Secondary | ICD-10-CM | POA: Diagnosis not present

## 2015-06-21 ENCOUNTER — Other Ambulatory Visit: Payer: Self-pay | Admitting: Family Medicine

## 2015-06-21 DIAGNOSIS — S065X9A Traumatic subdural hemorrhage with loss of consciousness of unspecified duration, initial encounter: Secondary | ICD-10-CM

## 2015-06-21 DIAGNOSIS — S065XAA Traumatic subdural hemorrhage with loss of consciousness status unknown, initial encounter: Secondary | ICD-10-CM

## 2015-06-27 ENCOUNTER — Ambulatory Visit
Admission: RE | Admit: 2015-06-27 | Discharge: 2015-06-27 | Disposition: A | Discharge status: Home / Self Care | Payer: Medicare Other | Source: Ambulatory Visit | Attending: Family Medicine | Admitting: Family Medicine

## 2015-06-27 DIAGNOSIS — S065XAA Traumatic subdural hemorrhage with loss of consciousness status unknown, initial encounter: Secondary | ICD-10-CM

## 2015-06-27 DIAGNOSIS — S065X9A Traumatic subdural hemorrhage with loss of consciousness of unspecified duration, initial encounter: Secondary | ICD-10-CM

## 2015-06-27 DIAGNOSIS — S0990XA Unspecified injury of head, initial encounter: Secondary | ICD-10-CM | POA: Diagnosis not present

## 2015-06-29 DIAGNOSIS — F331 Major depressive disorder, recurrent, moderate: Secondary | ICD-10-CM | POA: Diagnosis not present

## 2015-08-24 DIAGNOSIS — F331 Major depressive disorder, recurrent, moderate: Secondary | ICD-10-CM | POA: Diagnosis not present

## 2015-11-07 ENCOUNTER — Ambulatory Visit (INDEPENDENT_AMBULATORY_CARE_PROVIDER_SITE_OTHER): Payer: Medicare Other | Admitting: Gynecology

## 2015-11-07 ENCOUNTER — Encounter: Payer: Self-pay | Admitting: Gynecology

## 2015-11-07 VITALS — BP 120/72 | Ht 66.0 in | Wt 140.0 lb

## 2015-11-07 DIAGNOSIS — M8580 Other specified disorders of bone density and structure, unspecified site: Secondary | ICD-10-CM | POA: Diagnosis not present

## 2015-11-07 DIAGNOSIS — Z01419 Encounter for gynecological examination (general) (routine) without abnormal findings: Secondary | ICD-10-CM

## 2015-11-07 DIAGNOSIS — N952 Postmenopausal atrophic vaginitis: Secondary | ICD-10-CM | POA: Diagnosis not present

## 2015-11-07 NOTE — Patient Instructions (Signed)

## 2015-11-07 NOTE — Progress Notes (Signed)
Katrina Gamble [birth year] [id number]        74 y.o.  G3P3003  for breast and pelvic exam.  Past medical history,surgical history, problem list, medications, allergies, family history and social history were all reviewed and documented as reviewed in the EPIC chart.  ROS:  Performed with pertinent positives and negatives included in the history, assessment and plan.   Additional significant findings :  none   Exam: [name] assistant Filed Vitals:   11/07/15 0926  BP: 120/72  Height: 5\' 6"  (1.676 m)  Weight: 140 lb (63.504 kg)   General appearance:  Normal affect, orientation and appearance. Skin: Grossly normal HEENT: Without gross lesions.  No cervical or supraclavicular adenopathy. Thyroid normal.  Lungs:  Clear without wheezing, rales or rhonchi Cardiac: RR, without RMG Abdominal:  Soft, nontender, without masses, guarding, rebound, organomegaly or hernia Breasts:  Examined lying and sitting without masses, retractions, discharge or axillary adenopathy. Pelvic:  Ext/BUS/vagina with atrophic changes  Cervix with atrophic changes  Uterus axial to anteverted, normal size, shape and contour, midline and mobile nontender   Adnexa without masses or tenderness    Anus and perineum normal   Rectovaginal normal sphincter tone without palpated masses or tenderness.    Assessment/Plan:  75 y.o. G56P3003 female for .   1. Postmenopausal/atrophic genital changes. Doing well without significant hot flushes, night sweats, vaginal dryness and bleeding. Continue to monitor and report any bleeding. 2. Osteopenia.  DEXA 11/2014 T score -1.2 FRAX 14%/2.9%. Plan repeat DEXA in 1-2 years. 3. Mammography 01/2015. Continue with annual mammography when due. SBE monthly. 4. Colonoscopy 2010. Reported repeat interval 10 years. 5. Pap smear 2010. No Pap smear done today. No history of abnormal Pap smears. Reviewed current screening guidelines and we both agree to stop screening based on  age. 6. Health maintenance. No routine lab work done as patient reports his done at her primary physician's office. Follow up 1 year, sooner as needed.   [name] MD, 9:49 AM 11/07/2015

## 2015-11-16 DIAGNOSIS — F331 Major depressive disorder, recurrent, moderate: Secondary | ICD-10-CM | POA: Diagnosis not present

## 2015-12-09 DIAGNOSIS — H2513 Age-related nuclear cataract, bilateral: Secondary | ICD-10-CM | POA: Diagnosis not present

## 2016-02-08 DIAGNOSIS — F331 Major depressive disorder, recurrent, moderate: Secondary | ICD-10-CM | POA: Diagnosis not present

## 2016-03-07 DIAGNOSIS — F331 Major depressive disorder, recurrent, moderate: Secondary | ICD-10-CM | POA: Diagnosis not present

## 2016-03-28 DIAGNOSIS — J309 Allergic rhinitis, unspecified: Secondary | ICD-10-CM | POA: Diagnosis not present

## 2016-03-28 DIAGNOSIS — Z87891 Personal history of nicotine dependence: Secondary | ICD-10-CM | POA: Diagnosis not present

## 2016-03-28 DIAGNOSIS — F339 Major depressive disorder, recurrent, unspecified: Secondary | ICD-10-CM | POA: Diagnosis not present

## 2016-03-28 DIAGNOSIS — N289 Disorder of kidney and ureter, unspecified: Secondary | ICD-10-CM | POA: Diagnosis not present

## 2016-03-28 DIAGNOSIS — R202 Paresthesia of skin: Secondary | ICD-10-CM | POA: Diagnosis not present

## 2016-03-28 DIAGNOSIS — Z0000 Encounter for general adult medical examination without abnormal findings: Secondary | ICD-10-CM | POA: Diagnosis not present

## 2016-03-28 DIAGNOSIS — E559 Vitamin D deficiency, unspecified: Secondary | ICD-10-CM | POA: Diagnosis not present

## 2016-03-28 DIAGNOSIS — Z79899 Other long term (current) drug therapy: Secondary | ICD-10-CM | POA: Diagnosis not present

## 2016-04-19 DIAGNOSIS — M2042 Other hammer toe(s) (acquired), left foot: Secondary | ICD-10-CM | POA: Diagnosis not present

## 2016-04-19 DIAGNOSIS — R202 Paresthesia of skin: Secondary | ICD-10-CM | POA: Diagnosis not present

## 2016-04-19 DIAGNOSIS — M2041 Other hammer toe(s) (acquired), right foot: Secondary | ICD-10-CM | POA: Diagnosis not present

## 2016-04-20 ENCOUNTER — Other Ambulatory Visit: Payer: Self-pay | Admitting: Gynecology

## 2016-04-20 DIAGNOSIS — Z1231 Encounter for screening mammogram for malignant neoplasm of breast: Secondary | ICD-10-CM

## 2016-05-02 DIAGNOSIS — F331 Major depressive disorder, recurrent, moderate: Secondary | ICD-10-CM | POA: Diagnosis not present

## 2016-05-03 ENCOUNTER — Ambulatory Visit
Admission: RE | Admit: 2016-05-03 | Discharge: 2016-05-03 | Disposition: A | Discharge status: Home / Self Care | Payer: Medicare Other | Source: Ambulatory Visit | Attending: Gynecology | Admitting: Gynecology

## 2016-05-03 DIAGNOSIS — Z1231 Encounter for screening mammogram for malignant neoplasm of breast: Secondary | ICD-10-CM

## 2016-06-18 DIAGNOSIS — R202 Paresthesia of skin: Secondary | ICD-10-CM | POA: Diagnosis not present

## 2016-06-18 DIAGNOSIS — M2041 Other hammer toe(s) (acquired), right foot: Secondary | ICD-10-CM | POA: Diagnosis not present

## 2016-06-18 DIAGNOSIS — M21611 Bunion of right foot: Secondary | ICD-10-CM | POA: Diagnosis not present

## 2016-07-28 DIAGNOSIS — Z23 Encounter for immunization: Secondary | ICD-10-CM | POA: Diagnosis not present

## 2016-08-22 DIAGNOSIS — F331 Major depressive disorder, recurrent, moderate: Secondary | ICD-10-CM | POA: Diagnosis not present

## 2016-09-21 DIAGNOSIS — R002 Palpitations: Secondary | ICD-10-CM | POA: Diagnosis not present

## 2016-09-24 ENCOUNTER — Telehealth: Payer: Self-pay

## 2016-09-24 NOTE — Telephone Encounter (Signed)
Sent note to scheduling 

## 2016-10-25 ENCOUNTER — Ambulatory Visit (INDEPENDENT_AMBULATORY_CARE_PROVIDER_SITE_OTHER): Payer: Medicare Other | Admitting: Internal Medicine

## 2016-10-25 ENCOUNTER — Encounter: Payer: Self-pay | Admitting: Internal Medicine

## 2016-10-25 VITALS — BP 124/70 | HR 72 | Ht 66.0 in | Wt 142.8 lb

## 2016-10-25 DIAGNOSIS — R002 Palpitations: Secondary | ICD-10-CM | POA: Diagnosis not present

## 2016-10-25 NOTE — Patient Instructions (Signed)
Medication Instructions:  Your physician recommends that you continue on your current medications as directed. Please refer to the Current Medication list given to you today.   Labwork: None   Testing/Procedures: Your physician has recommended that you wear a holter monitor. Holter monitors are medical devices that record the heart's electrical activity. Doctors most often use these monitors to diagnose arrhythmias. Arrhythmias are problems with the speed or rhythm of the heartbeat. The monitor is a small, portable device. You can wear one while you do your normal daily activities. This is usually used to diagnose what is causing palpitations/syncope (passing out).  31 HOUR  Follow-Up: Your physician recommends that you schedule a follow-up appointment in: 1 month with Dr End.    Try to limit your caffeine use, this can help your palpitations.  If you need a refill on your cardiac medications before your next appointment, please call your pharmacy.

## 2016-10-25 NOTE — Progress Notes (Signed)
New Outpatient Visit Date: 10/25/2016  Referring Provider: Hulan Fess, MD [place], [place] [address]  Chief Complaint: Palpitations  HPI:  Ms. Nedrow is a 76 y.o. year-old female with history of depression, anxiety, and essential tremor, who has been referred by Dr. Rex Kras for evaluation of palpitations. The patient reports several months of fluttering in the chest and occasional "flip-flop" sensation. This typically happens at night when she is watching television, though it has also occurred at other times. Episodes last a couple of seconds and can occur several times a night. She denies associated symptoms including chest pain, shortness of breath, and lightheadedness. She previously consumed at least 4 cups of caffeinated coffee per day, but has cut down over the last month at the recommendation of her PCP. She now drinks about 2 cups of caffeinated coffee per day and has noticed improvement in her palpitations. She has been on Ritalin and Viibryd prescribed by her psychiatrist for many years. The only medication change around the time that her symptoms and was an increase in the dose of N-acetylcysteine, that she is taking for memory preservation. She cannot think of any other precipitants.  The patient denies a history of prior cardiovascular disease. She underwent a stress test several years ago and states that it was normal.  --------------------------------------------------------------------------------------------------  Cardiovascular History & Procedures: Cardiovascular Problems:  Palpitations  Risk Factors:  Age greater than 65  Cath/PCI:  None  CV Surgery:  None  EP Procedures and Devices:  None  Non-Invasive Evaluation(s):  Stress test approximately 10 years ago: Normal per patient's report.  Recent CV Pertinent Labs: No results found for: CHOL, HDL, LDLCALC, LDLDIRECT, TRIG, CHOLHDL, INR, BNP, K, MG, BUN,  CREATININE  --------------------------------------------------------------------------------------------------  Past Medical History:  Diagnosis Date  . Depression   . Osteopenia 11/2014   T score -1.2 FRAX 14%/2.9%  . Tremor, essential     Past Surgical History:  Procedure Laterality Date  . APPENDECTOMY    . CHOLECYSTECTOMY    . TONSILLECTOMY      Outpatient Encounter Prescriptions as of 10/25/2016  Medication Sig  . b complex vitamins tablet Take 1 tablet by mouth daily.  . Cholecalciferol (VITAMIN D PO) Take by mouth.  Marland Kitchen FLUZONE HIGH-DOSE 0.5 ML SUSY Inject 0.5 mLs as directed once.  . methylphenidate (METADATE CD) 20 MG CR capsule Take 20 mg by mouth every morning.  . Vilazodone HCl (VIIBRYD PO) Take by mouth.   No facility-administered encounter medications on file as of 10/25/2016.     Allergies: Codeine; Latex; and Septra [sulfamethoxazole-trimethoprim]  Social History   Social History  . Marital status: Widowed    Spouse name: N/A  . Number of children: N/A  . Years of education: N/A   Occupational History  . Not on file.   Social History Main Topics  . Smoking status: Former Smoker    Packs/day: 0.50    Years: 30.00    Types: Cigarettes    Quit date: 2012  . Smokeless tobacco: Never Used  . Alcohol use No  . Drug use: No  . Sexual activity: No         Family History  Problem Relation Age of Onset  . Hypertension Mother   . Thyroid disease Mother   . Breast cancer Mother 37  . Breast cancer Daughter 40  . Thyroid disease Daughter   . Other Father     Brain tumor  . Parkinson's disease Father   . Dementia Father   .  Cancer Daughter     Melanoma  . Heart disease Paternal Grandfather     Review of Systems: Review of Systems  Constitutional: Positive for diaphoresis. Negative for chills and fever.  HENT: Negative.   Eyes: Negative.   Respiratory: Negative.   Cardiovascular: Positive for palpitations. Negative for chest pain, orthopnea,  claudication, leg swelling and PND.  Gastrointestinal: Negative.   Genitourinary: Negative.   Musculoskeletal: Negative.   Skin: Negative.   Neurological: Negative.   Endo/Heme/Allergies: Negative.   Psychiatric/Behavioral: Positive for depression.   --------------------------------------------------------------------------------------------------  Physical Exam: BP 124/70 (BP Location: Right Arm, Patient Position: Sitting, Cuff Size: Normal)   Pulse 72   Ht 5\' 6"  (1.676 m)   Wt 142 lb 12.8 oz (64.8 kg)   SpO2 99%   BMI 23.05 kg/m   General:  Well-developed, well-nourished woman seated comfortably in the exam room. HEENT: No conjunctival pallor or scleral icterus.  Moist mucous membranes.  OP clear. Neck: Supple without lymphadenopathy, thyromegaly, JVD, or HJR.  No carotid bruit. Lungs: Normal work of breathing.  Clear to auscultation bilaterally without wheezes or crackles. Heart: Regular rate and rhythm without murmurs, rubs, or gallops.  Non-displaced PMI. Abd: Bowel sounds present.  Soft, NT/ND without hepatosplenomegaly Ext: No lower extremity edema.  Radial, PT, and DP pulses are 2+ bilaterally Skin: warm and dry without rash Neuro: CNIII-XII intact.  Strength and fine-touch sensation intact in upper and lower extremities bilaterally. Psych: Normal mood and affect.  EKG:  Normal sinus rhythm with isolated PAC. Otherwise, no significant abnormalities. No prior tracing available for comparison.  Outside labs 03/28/16: WBC 4.2, Hgb 13.0, platelet 226  Sodium 139, potassium 3.9, chloride 102, CO2 31, BUN 17, creatinine 0.8, glucose 92, calcium 9.7, AST 22, ALT 16, alkaline phosphatase 56, total bilirubin 0.7, total protein 6.8, albumin 4.4  TSH 3.1  --------------------------------------------------------------------------------------------------  ASSESSMENT AND PLAN: Palpitations Symptoms are most suggestive of PACs or PVCs with possible brief runs of the arrhythmia.  She does not have any worrisome symptoms such as chest pain or lightheadedness. She has already noticed improvement with decreased caffeine intake. I encouraged her to cut back further if possible. Ideally, she should also avoid stimulants such as Ritalin. However, we will not make any changes to her medication at this time. We will obtain a 48-hour Holter monitor to better characterize potential arrhythmias. If any significant arrhythmias are noted, we will consider echo +/- stress test.  Follow-up: Return to clinic in one month  Nelva Bush, MD 10/25/2016 7:20 PM

## 2016-10-30 ENCOUNTER — Ambulatory Visit (INDEPENDENT_AMBULATORY_CARE_PROVIDER_SITE_OTHER): Payer: Medicare Other

## 2016-10-30 DIAGNOSIS — R002 Palpitations: Secondary | ICD-10-CM

## 2016-11-07 ENCOUNTER — Ambulatory Visit (INDEPENDENT_AMBULATORY_CARE_PROVIDER_SITE_OTHER): Payer: Medicare Other | Admitting: Gynecology

## 2016-11-07 ENCOUNTER — Encounter: Payer: Self-pay | Admitting: Gynecology

## 2016-11-07 VITALS — BP 122/74 | Ht 65.0 in | Wt 144.0 lb

## 2016-11-07 DIAGNOSIS — Z01411 Encounter for gynecological examination (general) (routine) with abnormal findings: Secondary | ICD-10-CM

## 2016-11-07 DIAGNOSIS — N952 Postmenopausal atrophic vaginitis: Secondary | ICD-10-CM

## 2016-11-07 DIAGNOSIS — M8580 Other specified disorders of bone density and structure, unspecified site: Secondary | ICD-10-CM

## 2016-11-07 NOTE — Patient Instructions (Signed)

## 2016-11-07 NOTE — Progress Notes (Signed)
    Katrina Gamble [birth year] [id number]        76 y.o.  G3P3003 for breast and pelvic exam.  Past medical history,surgical history, problem list, medications, allergies, family history and social history were all reviewed and documented as reviewed in the EPIC chart.  ROS:  Performed with pertinent positives and negatives included in the history, assessment and plan.   Additional significant findings :  None   Exam: Katrina Gamble assistant Vitals:   11/07/16 0900  BP: 122/74  Weight: 144 lb (65.3 kg)  Height: 5\' 5"  (1.651 m)   Body mass index is 23.96 kg/m.  General appearance:  Normal affect, orientation and appearance. Skin: Grossly normal HEENT: Without gross lesions.  No cervical or supraclavicular adenopathy. Thyroid normal.  Lungs:  Clear without wheezing, rales or rhonchi Cardiac: RR, without RMG Abdominal:  Soft, nontender, without masses, guarding, rebound, organomegaly or hernia Breasts:  Examined lying and sitting without masses, retractions, discharge or axillary adenopathy. Pelvic:  Ext, BUS, Vagina: With atrophic changes  Cervix: With atrophic changes  Uterus: Anteverted, normal size, shape and contour, midline and mobile nontender   Adnexa: Without masses or tenderness    Anus and perineum: Normal   Rectovaginal: Normal sphincter tone without palpated masses or tenderness.    Assessment/Plan:  76 y.o. G30P3003 female for breast and pelvic exam.   1. Postmenopausal/atrophic genital changes. No significant hot flushes, vaginal dryness or any vaginal bleeding. Continue to monitor and report any symptoms or vaginal bleeding. 2. Osteopenia. DEXA 11/2014 T score -1.2 FRAX 14%/2.9%. Plan repeat DEXA in another year or 2. Increased calcium vitamin D. 3. Colonoscopy 2010 with reported repeat interval 10 years. 4. Pap smear 2010. No Pap smear done today. No history of abnormal Pap smears. Per current screening guidelines we both agree to stop  screening. 5. Mammography 04/2016. Continue with annual mammography when due. SBE monthly reviewed. 6. Health maintenance. No routine lab work done as patient reports this done elsewhere. Follow up 1 year, sooner as needed.   [name] MD, 9:21 AM 11/07/2016

## 2016-11-15 ENCOUNTER — Telehealth: Payer: Self-pay | Admitting: Internal Medicine

## 2016-11-15 NOTE — Telephone Encounter (Signed)
I left a message with the patient to contact the [employer] office to discuss results of recent Holter monitor. This demonstrated PACs and short runs of SVT.  Nelva Bush, MD [facility] Pager: [phone]

## 2016-11-16 NOTE — Telephone Encounter (Signed)
I spoke with the patient regarding the results of her recent Holter monitor, demonstrating PACs and PVCs as well as brief SVT lasting up to 5 beats. Her symptoms have been stable. We discussed medical therapy, but given that she is minimally symptomatic, she wishes to defer this. I asked her to contact us if her symptoms worsen or if new questions arise. It is reasonable to cancel her upcoming appointment with me.  Nelva Bush, MD [facility] Pager: [phone]

## 2016-11-16 NOTE — Telephone Encounter (Signed)
F/u Message  Pt returning Dr. Saunders Revel call about Holter monitor. Please call back to discuss

## 2016-11-30 ENCOUNTER — Ambulatory Visit: Payer: Medicare Other | Admitting: Internal Medicine

## 2016-12-11 DIAGNOSIS — H2513 Age-related nuclear cataract, bilateral: Secondary | ICD-10-CM | POA: Diagnosis not present

## 2016-12-12 DIAGNOSIS — F331 Major depressive disorder, recurrent, moderate: Secondary | ICD-10-CM | POA: Diagnosis not present

## 2017-03-06 DIAGNOSIS — F331 Major depressive disorder, recurrent, moderate: Secondary | ICD-10-CM | POA: Diagnosis not present

## 2017-04-19 DIAGNOSIS — E559 Vitamin D deficiency, unspecified: Secondary | ICD-10-CM | POA: Diagnosis not present

## 2017-04-19 DIAGNOSIS — H6121 Impacted cerumen, right ear: Secondary | ICD-10-CM | POA: Diagnosis not present

## 2017-04-19 DIAGNOSIS — Z0000 Encounter for general adult medical examination without abnormal findings: Secondary | ICD-10-CM | POA: Diagnosis not present

## 2017-04-19 DIAGNOSIS — J309 Allergic rhinitis, unspecified: Secondary | ICD-10-CM | POA: Diagnosis not present

## 2017-04-19 DIAGNOSIS — F339 Major depressive disorder, recurrent, unspecified: Secondary | ICD-10-CM | POA: Diagnosis not present

## 2017-04-19 DIAGNOSIS — H6120 Impacted cerumen, unspecified ear: Secondary | ICD-10-CM | POA: Diagnosis not present

## 2017-04-19 DIAGNOSIS — M8580 Other specified disorders of bone density and structure, unspecified site: Secondary | ICD-10-CM | POA: Diagnosis not present

## 2017-04-19 DIAGNOSIS — Z79899 Other long term (current) drug therapy: Secondary | ICD-10-CM | POA: Diagnosis not present

## 2017-04-24 DIAGNOSIS — F331 Major depressive disorder, recurrent, moderate: Secondary | ICD-10-CM | POA: Diagnosis not present

## 2017-06-14 ENCOUNTER — Other Ambulatory Visit: Payer: Self-pay | Admitting: Gynecology

## 2017-06-14 DIAGNOSIS — Z1231 Encounter for screening mammogram for malignant neoplasm of breast: Secondary | ICD-10-CM

## 2017-06-26 ENCOUNTER — Ambulatory Visit
Admission: RE | Admit: 2017-06-26 | Discharge: 2017-06-26 | Disposition: A | Discharge status: Home / Self Care | Payer: Medicare Other | Source: Ambulatory Visit | Attending: Gynecology | Admitting: Gynecology

## 2017-06-26 DIAGNOSIS — Z1231 Encounter for screening mammogram for malignant neoplasm of breast: Secondary | ICD-10-CM | POA: Diagnosis not present

## 2017-07-04 DIAGNOSIS — Z23 Encounter for immunization: Secondary | ICD-10-CM | POA: Diagnosis not present

## 2017-07-17 DIAGNOSIS — F331 Major depressive disorder, recurrent, moderate: Secondary | ICD-10-CM | POA: Diagnosis not present

## 2017-08-28 DIAGNOSIS — F331 Major depressive disorder, recurrent, moderate: Secondary | ICD-10-CM | POA: Diagnosis not present

## 2017-10-02 DIAGNOSIS — F331 Major depressive disorder, recurrent, moderate: Secondary | ICD-10-CM | POA: Diagnosis not present

## 2017-11-06 DIAGNOSIS — F331 Major depressive disorder, recurrent, moderate: Secondary | ICD-10-CM | POA: Diagnosis not present

## 2017-11-08 ENCOUNTER — Encounter: Payer: Self-pay | Admitting: Gynecology

## 2017-11-08 ENCOUNTER — Ambulatory Visit (INDEPENDENT_AMBULATORY_CARE_PROVIDER_SITE_OTHER): Payer: Medicare Other | Admitting: Gynecology

## 2017-11-08 VITALS — BP 120/76 | Temp 98.2°F | Ht 65.0 in | Wt 146.0 lb

## 2017-11-08 DIAGNOSIS — Z01411 Encounter for gynecological examination (general) (routine) with abnormal findings: Secondary | ICD-10-CM | POA: Diagnosis not present

## 2017-11-08 DIAGNOSIS — M8580 Other specified disorders of bone density and structure, unspecified site: Secondary | ICD-10-CM

## 2017-11-08 DIAGNOSIS — N952 Postmenopausal atrophic vaginitis: Secondary | ICD-10-CM

## 2017-11-08 NOTE — Progress Notes (Signed)
    Katrina Gamble 02-05-1941 [id number]        76 y.o.  G3P3003 for breast and pelvic exam.  Doing well without GYN complaints.  Past medical history,surgical history, problem list, medications, allergies, family history and social history were all reviewed and documented as reviewed in the EPIC chart.  ROS:  Performed with pertinent positives and negatives included in the history, assessment and plan.   Additional significant findings : None   Exam: [name] assistant Vitals:   11/08/17 0855  BP: 120/76  Temp: 98.2 F (36.8 C)  TempSrc: Oral  Weight: 146 lb (66.2 kg)  Height: 5\' 5"  (1.651 m)   Body mass index is 24.3 kg/m.  General appearance:  Normal affect, orientation and appearance. Skin: Grossly normal HEENT: Without gross lesions.  No cervical or supraclavicular adenopathy. Thyroid normal.  Lungs:  Clear without wheezing, rales or rhonchi Cardiac: RR, without RMG Abdominal:  Soft, nontender, without masses, guarding, rebound, organomegaly or hernia Breasts:  Examined lying and sitting without masses, retractions, discharge or axillary adenopathy. Pelvic:  Ext, BUS, Vagina: With atrophic changes  Cervix: With atrophic changes  Uterus: Anteverted, normal size, shape and contour, midline and mobile nontender   Adnexa: Without masses or tenderness    Anus and perineum: Normal   Rectovaginal: Normal sphincter tone without palpated masses or tenderness.    Assessment/Plan:  77 y.o. G75P3003 female for breast and pelvic exam.   1. Postmenopausal/atrophic genital changes.  No significant hot flushes, night sweats, vaginal dryness or any bleeding.  Report any issues or bleeding. 2. Osteopenia.  DEXA 2016 T score -1.2 FRAX 14% / 2.9%.  Recommend follow-up DEXA now at 3-year interval and patient will schedule in follow-up for this. 3. Colonoscopy 2010.  Plan to repeat at 10-year interval per reported recommendation. 4. Mammography 06/2017.  Continue with annual  mammography when due.  SBE monthly reviewed. 5. Pap smear 2010.  No Pap smear done today.  No history of significant abnormal Pap smears.  Current screening guidelines we both agree to stop screening based on age. 6. Health maintenance.  Patient has routine lab work done elsewhere.  Follow-up for bone density as scheduled.  Follow-up in 1 year for annual exam.   [name] MD, 9:21 AM 11/08/2017

## 2017-11-08 NOTE — Patient Instructions (Signed)
Followup for bone density as scheduled. 

## 2017-11-27 ENCOUNTER — Telehealth: Payer: Self-pay | Admitting: Gynecology

## 2017-11-27 ENCOUNTER — Ambulatory Visit (INDEPENDENT_AMBULATORY_CARE_PROVIDER_SITE_OTHER): Payer: Medicare Other

## 2017-11-27 ENCOUNTER — Other Ambulatory Visit: Payer: Self-pay | Admitting: Gynecology

## 2017-11-27 ENCOUNTER — Encounter: Payer: Self-pay | Admitting: Gynecology

## 2017-11-27 DIAGNOSIS — M8588 Other specified disorders of bone density and structure, other site: Secondary | ICD-10-CM

## 2017-11-27 DIAGNOSIS — M8580 Other specified disorders of bone density and structure, unspecified site: Secondary | ICD-10-CM

## 2017-11-27 DIAGNOSIS — M85852 Other specified disorders of bone density and structure, left thigh: Secondary | ICD-10-CM

## 2017-11-27 NOTE — Telephone Encounter (Signed)
Tell patient that her most recent bone study shows a minimal amount of loss of calcium from the bones.  -1 is normal.  Hers was -1.1.  -2.5 is considered osteoporosis.  We did a theoretical 10-year fracture risk assessment which estimates her 10-year risk of fracture.  It takes into account her parental history of hip fracture and her age as well as several other factors.  The 10-year any fracture risk is 24% and hip fracture is 14%.  They recommend considering medication if your theoretical fracture risk is greater than 20% and 3% for those 2 numbers.  She exceeds both of these.  By this she has an indication to start on medication for bones such as Fosamax.  If she is interested in discussing her treatment options then recommend office visit.

## 2017-11-28 ENCOUNTER — Encounter: Payer: Self-pay | Admitting: *Deleted

## 2017-11-28 NOTE — Telephone Encounter (Signed)
Sent mychart message

## 2017-12-09 ENCOUNTER — Ambulatory Visit (INDEPENDENT_AMBULATORY_CARE_PROVIDER_SITE_OTHER): Payer: Medicare Other | Admitting: Gynecology

## 2017-12-09 ENCOUNTER — Encounter: Payer: Self-pay | Admitting: Gynecology

## 2017-12-09 VITALS — BP 118/78

## 2017-12-09 DIAGNOSIS — M8588 Other specified disorders of bone density and structure, other site: Secondary | ICD-10-CM | POA: Diagnosis not present

## 2017-12-09 DIAGNOSIS — Z8262 Family history of osteoporosis: Secondary | ICD-10-CM | POA: Diagnosis not present

## 2017-12-09 DIAGNOSIS — E559 Vitamin D deficiency, unspecified: Secondary | ICD-10-CM

## 2017-12-09 DIAGNOSIS — M8580 Other specified disorders of bone density and structure, unspecified site: Secondary | ICD-10-CM | POA: Diagnosis not present

## 2017-12-09 HISTORY — DX: Vitamin D deficiency, unspecified: E55.9

## 2017-12-09 NOTE — Progress Notes (Signed)
    Katrina Gamble Aug 02, 1941 [id number]        77 y.o.  G3P3003 presents to discuss her most recent bone density which shows a T score of -1.1 FRAX 24% / 14%.  Comparison to prior DEXA's she remains stable without significant loss.  Review of her risk factors on her FRAX shows a maternal history of hip fracture in her 42s.  Otherwise negative.  Patient active without instability issues.  No overt risk factors other than her family history.  Does have a history of vitamin D deficiency in the past.  Has not had a recent vitamin D level  Past medical history,surgical history, problem list, medications, allergies, family history and social history were all reviewed and documented in the EPIC chart.  Directed ROS with pertinent positives and negatives documented in the history of present illness/assessment and plan.  Exam: Vitals:   12/09/17 1159  BP: 118/78   General appearance:  Normal  Assessment/Plan:  77 y.o. G3P3003 with minimal osteopenia T score -1.1.  Stable from prior studies.  Overall healthy and active with no risk factors other than maternal history of hip fracture in her 44s.  FRAX which I think is a reflection of her maternal history of hip fracture shows a elevated 24% overall 10-year risk of fracture and a 14% hip fracture risk.  I discussed with her that this increased risk of fracture as an indication to offer medication.  We reviewed medications in general.  We also discussed observation at this time given the minimal osteopenia on actual bone measurement, her active status and no other risk factors.  After a discussion the patient and I both feel comfortable with no treatment at this time.  I did recommend a vitamin D level today to make sure she is in the therapeutic range knowing she has a history of vitamin D deficiency in the past.  Otherwise we will plan on repeating the bone density in 2 years.  The patient is comfortable with this plan.  Greater than 50% of my  15-minute office visit was spent in direct face to face counseling and coordination of care with the patient.     [name] MD, 12:27 PM 12/09/2017

## 2017-12-09 NOTE — Patient Instructions (Signed)
Follow-up for annual exam next year.

## 2017-12-10 ENCOUNTER — Other Ambulatory Visit: Payer: Self-pay | Admitting: Gynecology

## 2017-12-10 ENCOUNTER — Encounter: Payer: Self-pay | Admitting: Gynecology

## 2017-12-10 DIAGNOSIS — E559 Vitamin D deficiency, unspecified: Secondary | ICD-10-CM

## 2017-12-10 LAB — VITAMIN D 25 HYDROXY (VIT D DEFICIENCY, FRACTURES): Vit D, 25-Hydroxy: 28 ng/mL — ABNORMAL LOW (ref 30–100)

## 2017-12-17 DIAGNOSIS — H2513 Age-related nuclear cataract, bilateral: Secondary | ICD-10-CM | POA: Diagnosis not present

## 2017-12-19 DIAGNOSIS — R5383 Other fatigue: Secondary | ICD-10-CM | POA: Diagnosis not present

## 2017-12-19 DIAGNOSIS — Z79899 Other long term (current) drug therapy: Secondary | ICD-10-CM | POA: Diagnosis not present

## 2018-01-29 DIAGNOSIS — F331 Major depressive disorder, recurrent, moderate: Secondary | ICD-10-CM | POA: Diagnosis not present

## 2018-02-11 DIAGNOSIS — E039 Hypothyroidism, unspecified: Secondary | ICD-10-CM | POA: Diagnosis not present

## 2018-03-19 DIAGNOSIS — F331 Major depressive disorder, recurrent, moderate: Secondary | ICD-10-CM | POA: Diagnosis not present

## 2018-05-16 DIAGNOSIS — J309 Allergic rhinitis, unspecified: Secondary | ICD-10-CM | POA: Diagnosis not present

## 2018-05-16 DIAGNOSIS — E039 Hypothyroidism, unspecified: Secondary | ICD-10-CM | POA: Diagnosis not present

## 2018-05-16 DIAGNOSIS — Z0000 Encounter for general adult medical examination without abnormal findings: Secondary | ICD-10-CM | POA: Diagnosis not present

## 2018-05-16 DIAGNOSIS — F339 Major depressive disorder, recurrent, unspecified: Secondary | ICD-10-CM | POA: Diagnosis not present

## 2018-05-16 DIAGNOSIS — Z136 Encounter for screening for cardiovascular disorders: Secondary | ICD-10-CM | POA: Diagnosis not present

## 2018-05-16 DIAGNOSIS — Z79899 Other long term (current) drug therapy: Secondary | ICD-10-CM | POA: Diagnosis not present

## 2018-05-16 DIAGNOSIS — M8580 Other specified disorders of bone density and structure, unspecified site: Secondary | ICD-10-CM | POA: Diagnosis not present

## 2018-05-19 ENCOUNTER — Other Ambulatory Visit: Payer: Self-pay | Admitting: Gynecology

## 2018-05-19 DIAGNOSIS — Z1231 Encounter for screening mammogram for malignant neoplasm of breast: Secondary | ICD-10-CM

## 2018-06-11 ENCOUNTER — Ambulatory Visit (INDEPENDENT_AMBULATORY_CARE_PROVIDER_SITE_OTHER): Payer: Medicare Other | Admitting: Psychiatry

## 2018-06-11 ENCOUNTER — Telehealth: Payer: Self-pay | Admitting: Psychiatry

## 2018-06-11 DIAGNOSIS — F331 Major depressive disorder, recurrent, moderate: Secondary | ICD-10-CM

## 2018-06-11 MED ORDER — LITHIUM CARBONATE 300 MG PO CAPS
300.0000 mg | ORAL_CAPSULE | Freq: Every day | ORAL | 1 refills | Status: DC
Start: 2018-06-11 — End: 2018-11-24

## 2018-06-11 NOTE — Progress Notes (Signed)
[facility]  Patient ID: Katrina Gamble,  MRN: [id number]  PCP: Hulan Fess, MD  Date of Evaluation: 06/11/2018 Time spent:20 minutes   HISTORY/CURRENT STATUS: "Not as good".  Poor appetite in am is new.  Noticed since starting levothyroxine. Anniv of death of H.  Don't want to leave the house.  No excessive sleep.  Some sadness, unusual for her depression.  Tremor worse.  Weight stable. Not motivated.  Change in generic methylphenidate to [place].  May have started the sx.  Li level 0.3., nl TSH, nl UA., nl CMP on 9/6.   Individual Medical History/ Review of Systems: Changes? :Yes Started levothyroxin   Allergies: Codeine; Latex; and Septra [sulfamethoxazole-trimethoprim]  Current Medications:  Current Outpatient Medications:  .  levothyroxine (SYNTHROID, LEVOTHROID) 25 MCG tablet, Take 25 mcg by mouth daily before breakfast., Disp: , Rfl:  .  lithium carbonate 300 MG capsule, Take 1 capsule (300 mg total) by mouth daily., Disp: 90 capsule, Rfl: 1 .  methylphenidate (RITALIN) 20 MG tablet, Take 20 mg by mouth 3 (three) times daily with meals., Disp: , Rfl:  .  b complex vitamins tablet, Take 1 tablet by mouth daily., Disp: , Rfl:  .  Cholecalciferol (VITAMIN D PO), Take by mouth., Disp: , Rfl:  Medication Side Effects: Other: tremor, decreased appetitie  Family Medical/ Social History: Changes? Anniversary of H death. 77th BD.   MENTAL HEALTH EXAM:  There were no vitals taken for this visit.There is no height or weight on file to calculate BMI.  General Appearance: Casual  Eye Contact:  Good  Speech:  Normal Rate  Volume:  Normal  Mood:  Depressed  Affect:  Full Range  Thought Process:  Goal Directed  Orientation:  Full (Time, Place, and Person)  Thought Content: WDL   Suicidal Thoughts:  No  Homicidal Thoughts:  No  Memory:  Recent  Judgement:  Good  Insight:  Good  Psychomotor Activity:  Normal  Concentration:  Concentration: Good  Recall:   Good  Fund of Knowledge: Good  Language: Good  Akathisia:  No  AIMS (if indicated): not done  Assets:  Chief Executive Officer Physical Health Vocational/Educational  ADL's:  Intact  Cognition: WNL  Prognosis:  Good    DIAGNOSES:    ICD-10-CM   1. Major depressive disorder, recurrent episode, moderate (HCC) F33.1     RECOMMENDATIONS: Option treat tremor with B6 500mg  BID. Change back to old generic Ritalin that worked better. Consider increase lithium.    Purnell Shoemaker, MD

## 2018-06-11 NOTE — Telephone Encounter (Signed)
Open in error

## 2018-06-11 NOTE — Patient Instructions (Signed)
Option B6 500 mg twice daily for tremor.\ Call back with name of preferred generic Ritalin.

## 2018-06-12 ENCOUNTER — Telehealth: Payer: Self-pay

## 2018-06-12 NOTE — Telephone Encounter (Signed)
Patient spoke with [place] [facility] [place] staff messaged me and wrote "Patient received her lab recall later and she stated she has not taken vit D as per TF recommendation in April. She will at some point started taking them and she will repeat vit D 3 months after she starts. ".

## 2018-07-01 ENCOUNTER — Ambulatory Visit
Admission: RE | Admit: 2018-07-01 | Discharge: 2018-07-01 | Disposition: A | Discharge status: Home / Self Care | Payer: Medicare Other | Source: Ambulatory Visit | Attending: Gynecology | Admitting: Gynecology

## 2018-07-01 DIAGNOSIS — Z1231 Encounter for screening mammogram for malignant neoplasm of breast: Secondary | ICD-10-CM

## 2018-07-18 ENCOUNTER — Other Ambulatory Visit: Payer: Self-pay | Admitting: Psychiatry

## 2018-07-18 ENCOUNTER — Telehealth: Payer: Self-pay

## 2018-07-18 ENCOUNTER — Other Ambulatory Visit: Payer: Self-pay

## 2018-07-21 ENCOUNTER — Other Ambulatory Visit: Payer: Self-pay | Admitting: Psychiatry

## 2018-07-21 DIAGNOSIS — Z23 Encounter for immunization: Secondary | ICD-10-CM | POA: Diagnosis not present

## 2018-07-21 MED ORDER — METHYLPHENIDATE HCL 20 MG PO TABS: 20.0000 mg | ORAL_TABLET | Freq: Three times a day (TID) | ORAL | 0 refills | Status: DC

## 2018-07-21 NOTE — Progress Notes (Signed)
mPH refill

## 2018-07-23 NOTE — Telephone Encounter (Signed)
Sent request for refill to provider.

## 2018-08-24 DIAGNOSIS — R350 Frequency of micturition: Secondary | ICD-10-CM | POA: Diagnosis not present

## 2018-08-26 ENCOUNTER — Encounter: Payer: Self-pay | Admitting: Emergency Medicine

## 2018-08-26 DIAGNOSIS — F988 Other specified behavioral and emotional disorders with onset usually occurring in childhood and adolescence: Secondary | ICD-10-CM

## 2018-08-26 DIAGNOSIS — F411 Generalized anxiety disorder: Secondary | ICD-10-CM

## 2018-08-26 DIAGNOSIS — F429 Obsessive-compulsive disorder, unspecified: Secondary | ICD-10-CM

## 2018-09-12 ENCOUNTER — Other Ambulatory Visit: Payer: Self-pay | Admitting: Psychiatry

## 2018-09-12 ENCOUNTER — Ambulatory Visit: Payer: Medicare Other | Admitting: Psychiatry

## 2018-09-12 ENCOUNTER — Ambulatory Visit (INDEPENDENT_AMBULATORY_CARE_PROVIDER_SITE_OTHER): Payer: Medicare Other | Admitting: Psychiatry

## 2018-09-12 ENCOUNTER — Encounter: Payer: Self-pay | Admitting: Psychiatry

## 2018-09-12 VITALS — BP 145/83 | HR 83

## 2018-09-12 DIAGNOSIS — F900 Attention-deficit hyperactivity disorder, predominantly inattentive type: Secondary | ICD-10-CM

## 2018-09-12 DIAGNOSIS — F331 Major depressive disorder, recurrent, moderate: Secondary | ICD-10-CM | POA: Diagnosis not present

## 2018-09-12 DIAGNOSIS — F411 Generalized anxiety disorder: Secondary | ICD-10-CM

## 2018-09-12 MED ORDER — METHYLPHENIDATE HCL 20 MG PO TABS
20.0000 mg | ORAL_TABLET | Freq: Three times a day (TID) | ORAL | 0 refills | Status: DC
Start: 2018-10-10 — End: 2018-11-10

## 2018-09-12 MED ORDER — METHYLPHENIDATE HCL 20 MG PO TABS: 20.0000 mg | ORAL_TABLET | Freq: Three times a day (TID) | ORAL | 0 refills | Status: DC

## 2018-09-12 MED ORDER — METHYLPHENIDATE HCL 20 MG PO TABS: 20.0000 mg | ORAL_TABLET | Freq: Two times a day (BID) | ORAL | 0 refills | Status: DC

## 2018-09-12 NOTE — Progress Notes (Signed)
Should be TID.  Will fix Lynder Parents, MD, DFAPA

## 2018-09-12 NOTE — Progress Notes (Signed)
Katrina Gamble [id number] February 10, 1941 78 y.o.  Subjective:   Patient ID:  Katrina Gamble is a 78 y.o. (DOB 06/12/1941) female.  Chief Complaint:  Chief Complaint  Patient presents with  . Follow-up    Medication Management  . ADD  . Medication Problem    HPI Katrina Gamble presents to the office today for follow-up of ADD and mood.  Did reduce the Ritalin from 15mg  QID to 10 mg QID, probably in November bc change in generic methylphenidate to [place].  May have started the sx.  Could't find the preferred manufacturer at other pharmacies either.Dosage seems ok now.  Rx written for 20 mg TID.  November not good seasonally and was bad this year too with crying which is unusual and it's resolved.    Was calmer and more prepared for Xmas than usual.  December much better than usual.  Psych med history: Trintellix caused anxiety, Abilify no response, history of Dexedrine during pregnancy which caused weight gain, Mellaril, Nardil, Prozac with side effects, try cyclic antidepressants, Wellbutrin which caused tremors, imipramine, Pristiq 100, Vyvanse, Cerefolin NAC, Viibryd, lithium, Ritalin  Review of Systems:  Review of Systems  Neurological: Positive for tremors. Negative for weakness.  Psychiatric/Behavioral: Negative for agitation, behavioral problems, confusion, decreased concentration, dysphoric mood, hallucinations, self-injury, sleep disturbance and suicidal ideas. The patient is not nervous/anxious and is not hyperactive.     Medications: I have reviewed the patient's current medications.  Current Outpatient Medications  Medication Sig Dispense Refill  . levothyroxine (SYNTHROID, LEVOTHROID) 25 MCG tablet Take 25 mcg by mouth daily before breakfast.    . lithium carbonate 300 MG capsule Take 1 capsule (300 mg total) by mouth daily. 90 capsule 1  . methylphenidate (RITALIN) 20 MG tablet Take 1 tablet (20 mg total) by mouth 3 (three) times daily with meals.  (Patient taking differently: Take 10 mg by mouth 3 (three) times daily with meals. ) 90 tablet 0  . b complex vitamins tablet Take 1 tablet by mouth daily.    . Cholecalciferol (VITAMIN D PO) Take by mouth.     No current facility-administered medications for this visit.     Medication Side Effects: Other: tremor from lithium.  ?  Numbness in toes.  Hands and feet always cold.  Allergies:  Allergies  Allergen Reactions  . Codeine Other (See Comments)    Abdominal pain   . Latex Hives  . Septra [Sulfamethoxazole-Trimethoprim] Rash    Past Medical History:  Diagnosis Date  . Depression   . Osteopenia 11/2017   T score -1.1 FRAX 24% / 14%  . Tremor, essential   . Vitamin D deficiency 12/2017    Family History  Problem Relation Age of Onset  . Hypertension Mother   . Thyroid disease Mother   . Breast cancer Mother 57  . Breast cancer Daughter 14  . Thyroid disease Daughter   . Other Father        Brain tumor  . Parkinson's disease Father   . Dementia Father   . Cancer Daughter        Melanoma  . Heart disease Paternal Grandfather     Social History   Socioeconomic History  . Marital status: Widowed    Spouse name: Not on file  . Number of children: Not on file  . Years of education: Not on file  . Highest education level: Not on file  Occupational History  . Not on file  Social Needs  . Financial  resource strain: Not on file  . Food insecurity:    Worry: Not on file    Inability: Not on file  . Transportation needs:    Medical: Not on file    Non-medical: Not on file  Tobacco Use  . Smoking status: Former Smoker    Packs/day: 0.50    Years: 30.00    Pack years: 15.00    Types: Cigarettes    Last attempt to quit: 2012    Years since quitting: 8.0  . Smokeless tobacco: Never Used  Substance and Sexual Activity  . Alcohol use: No    Alcohol/week: 0.0 standard drinks  . Drug use: No  . Sexual activity: Never    Birth control/protection:  Post-menopausal    Comment: 1st intercourse 78 yo-Fewer than 5 partners  Lifestyle  . Physical activity:    Days per week: Not on file    Minutes per session: Not on file  . Stress: Not on file  Relationships  . Social connections:    Talks on phone: Not on file    Gets together: Not on file    Attends religious service: Not on file    Active member of club or organization: Not on file    Attends meetings of clubs or organizations: Not on file    Relationship status: Not on file  . Intimate partner violence:    Fear of current or ex partner: Not on file    Emotionally abused: Not on file    Physically abused: Not on file    Forced sexual activity: Not on file  Other Topics Concern  . Not on file  Social History Narrative  . Not on file    Past Medical History, Surgical history, Social history, and Family history were reviewed and updated as appropriate.   Please see review of systems for further details on the patient's review from today.   Objective:   Physical Exam:  BP (!) 145/83   Pulse 83   Physical Exam Constitutional:      General: She is not in acute distress.    Appearance: She is well-developed.  Musculoskeletal:        General: No deformity.  Neurological:     Mental Status: She is alert and oriented to person, place, and time.     Motor: No tremor.     Coordination: Coordination normal.     Gait: Gait normal.  Psychiatric:        Attention and Perception: Attention and perception normal.        Mood and Affect: Mood is not anxious or depressed. Affect is not labile, blunt, angry or inappropriate.        Speech: Speech normal.        Behavior: Behavior normal.        Thought Content: Thought content normal. Thought content does not include homicidal or suicidal ideation. Thought content does not include homicidal or suicidal plan.        Cognition and Memory: Cognition normal.        Judgment: Judgment normal.     Comments: Insight intact. No  auditory or visual hallucinations. No delusions.      Lab Review:  No results found for: NA, K, CL, CO2, GLUCOSE, BUN, CREATININE, CALCIUM, PROT, ALBUMIN, AST, ALT, ALKPHOS, BILITOT, GFRNONAA, GFRAA  No results found for: WBC, RBC, HGB, HCT, PLT, MCV, MCH, MCHC, RDW, LYMPHSABS, MONOABS, EOSABS, BASOSABS  No results found for: POCLITH, LITHIUM   No results  found for: PHENYTOIN, PHENOBARB, VALPROATE, CBMZ   .res Assessment: Plan:    Major depressive disorder, recurrent episode, moderate (HCC)  Attention deficit hyperactivity disorder (ADHD), predominantly inattentive type  Generalized anxiety disorder   Katrina Gamble has a long history over decades of cycling major depression and treatment resistance to meds noted above of nearly every antidepressant category.  Her best response so far has been from low-dose lithium with a stimulant.  She recently had to change from an unknown preferred generic to the less desirable [place] generic overall satisfied with meds.  She could not find an alternative generic Ritalin so she has reduced the dosage and seems to tolerate this better.  Counseled patient regarding potential benefits, risks, and side effects of lithium to include potential risk of lithium affecting thyroid and renal function.  Discussed need for periodic lab monitoring to determine drug level and to assess for potential adverse effects.  Counseled patient regarding signs and symptoms of lithium toxicity and advised that they notify office immediately or seek urgent medical attention if experiencing these signs and symptoms.  Patient advised to contact office with any questions or concerns.  However she is at a low dosage so I do not believe we are compelled to do frequent monitoring.  Specifically her lithium level September 6 on this dosage of lithium was 0.3.  Her urinalysis was normal she had a normal CBC, comprehensive metabolic panel was normal including creatinine 0.83 calcium 9.9  and TSH was normal at 2.83.  Lithium level in April 2019 was 0.2 at this dosage.  We will not increase the lithium because she is currently having tremor at the low dose.  Discussed potential benefits, risks, and side effects of stimulants with patient to include increased heart rate, palpitations, insomnia, increased anxiety, increased irritability, or decreased appetite.  Instructed patient to contact office if experiencing any significant tolerability issues.  This appt was 30 mins.  Lynder Parents, MD, DFAPA '   Please see After Visit Summary for patient specific instructions.  No future appointments.  No orders of the defined types were placed in this encounter.     -------------------------------

## 2018-11-10 ENCOUNTER — Ambulatory Visit (INDEPENDENT_AMBULATORY_CARE_PROVIDER_SITE_OTHER): Payer: Medicare Other | Admitting: Gynecology

## 2018-11-10 ENCOUNTER — Encounter: Payer: Self-pay | Admitting: Gynecology

## 2018-11-10 VITALS — BP 124/74 | Ht 65.0 in | Wt 145.0 lb

## 2018-11-10 DIAGNOSIS — M8580 Other specified disorders of bone density and structure, unspecified site: Secondary | ICD-10-CM | POA: Diagnosis not present

## 2018-11-10 DIAGNOSIS — Z9189 Other specified personal risk factors, not elsewhere classified: Secondary | ICD-10-CM | POA: Diagnosis not present

## 2018-11-10 DIAGNOSIS — N952 Postmenopausal atrophic vaginitis: Secondary | ICD-10-CM | POA: Diagnosis not present

## 2018-11-10 DIAGNOSIS — Z01419 Encounter for gynecological examination (general) (routine) without abnormal findings: Secondary | ICD-10-CM

## 2018-11-10 NOTE — Patient Instructions (Signed)
Follow-up in 1 year for annual exam, sooner if any issues. 

## 2018-11-10 NOTE — Progress Notes (Signed)
    JAHNAI SLINGERLAND 1940-10-31 [id number]        78 y.o.  G3P3003 for breast and pelvic exam  Past medical history,surgical history, problem list, medications, allergies, family history and social history were all reviewed and documented as reviewed in the EPIC chart.  ROS:  Performed with pertinent positives and negatives included in the history, assessment and plan.   Additional significant findings : None   Exam: [name] assistant Vitals:   11/10/18 1432  BP: 124/74  Weight: 145 lb (65.8 kg)  Height: 5\' 5"  (1.651 m)   Body mass index is 24.13 kg/m.  General appearance:  Normal affect, orientation and appearance. Skin: Grossly normal HEENT: Without gross lesions.  No cervical or supraclavicular adenopathy. Thyroid normal.  Lungs:  Clear without wheezing, rales or rhonchi Cardiac: RR, without RMG Abdominal:  Soft, nontender, without masses, guarding, rebound, organomegaly or hernia Breasts:  Examined lying and sitting without masses, retractions, discharge or axillary adenopathy. Pelvic:  Ext, BUS, Vagina: With atrophic changes  Cervix: With atrophic changes  Uterus: Anteverted, normal size, shape and contour, midline and mobile nontender   Adnexa: Without masses or tenderness    Anus and perineum: Normal   Rectovaginal: Normal sphincter tone without palpated masses or tenderness.    Assessment/Plan:  78 y.o. G37P3003 female for breast and pelvic exam  1. Postmenopausal.  No significant menopausal symptoms or any vaginal bleeding. 2. Osteopenia.  DEXA 2019 T score -1.1 FRAX 24% / 14%.  We discussed treatment options based on elevated FRAX as per 12/09/2017 office note.  She elected not to be treated at that time.  We will plan on repeat DEXA next year at 2-year interval. 3. Colonoscopy 2010.  Follow-up with primary physician for colon screening recommendations. 4. Mammography 06/2018.  Continue with annual mammography when due.  Breast exam normal today. 5. Pap  smear 2010.  No Pap smear done today.  No history of abnormal Pap smears.  We both agree to stop screening per current screening guidelines based on age. 6. Health maintenance.  No routine lab work done as patient does this elsewhere.  Follow-up 1 year, sooner as needed.   [name] MD, 2:50 PM 11/10/2018

## 2018-11-12 DIAGNOSIS — R300 Dysuria: Secondary | ICD-10-CM | POA: Diagnosis not present

## 2018-11-12 DIAGNOSIS — R350 Frequency of micturition: Secondary | ICD-10-CM | POA: Diagnosis not present

## 2018-11-12 DIAGNOSIS — E039 Hypothyroidism, unspecified: Secondary | ICD-10-CM | POA: Diagnosis not present

## 2018-11-24 ENCOUNTER — Telehealth: Payer: Self-pay | Admitting: Psychiatry

## 2018-11-24 ENCOUNTER — Other Ambulatory Visit: Payer: Self-pay

## 2018-11-24 MED ORDER — LITHIUM CARBONATE 300 MG PO CAPS: 300.0000 mg | ORAL_CAPSULE | Freq: Every day | ORAL | 1 refills | Status: DC

## 2018-11-24 NOTE — Telephone Encounter (Signed)
rx submitted.  

## 2018-11-24 NOTE — Telephone Encounter (Signed)
Pt called need refill on Lithium 300mg  1/d at [facility]  [phone].

## 2019-01-12 ENCOUNTER — Encounter: Payer: Self-pay | Admitting: Psychiatry

## 2019-01-12 ENCOUNTER — Ambulatory Visit (INDEPENDENT_AMBULATORY_CARE_PROVIDER_SITE_OTHER): Payer: Medicare Other | Admitting: Psychiatry

## 2019-01-12 ENCOUNTER — Other Ambulatory Visit: Payer: Self-pay

## 2019-01-12 DIAGNOSIS — F331 Major depressive disorder, recurrent, moderate: Secondary | ICD-10-CM

## 2019-01-12 DIAGNOSIS — F411 Generalized anxiety disorder: Secondary | ICD-10-CM

## 2019-01-12 DIAGNOSIS — G251 Drug-induced tremor: Secondary | ICD-10-CM | POA: Diagnosis not present

## 2019-01-12 DIAGNOSIS — F900 Attention-deficit hyperactivity disorder, predominantly inattentive type: Secondary | ICD-10-CM | POA: Diagnosis not present

## 2019-01-12 MED ORDER — METHYLPHENIDATE HCL ER 20 MG PO TBCR: 20.0000 mg | EXTENDED_RELEASE_TABLET | Freq: Every day | ORAL | 0 refills | Status: DC

## 2019-01-12 MED ORDER — METHYLPHENIDATE HCL 20 MG PO TABS: 20.0000 mg | ORAL_TABLET | Freq: Three times a day (TID) | ORAL | 0 refills | Status: DC

## 2019-01-12 NOTE — Progress Notes (Signed)
Katrina Gamble [id number] [birth year] 78 y.o.   Virtual Visit via [place]  I connected with pt by WebEx and verified that I am speaking with the correct person using two identifiers.   I discussed the limitations, risks, security and privacy concerns of performing an evaluation and management service by [facility] and the availability of in person appointments. I also discussed with the patient that there may be a patient responsible charge related to this service. The patient expressed understanding and agreed to proceed.  I discussed the assessment and treatment plan with the patient. The patient was provided an opportunity to ask questions and all were answered. The patient agreed with the plan and demonstrated an understanding of the instructions.   The patient was advised to call back or seek an in-person evaluation if the symptoms worsen or if the condition fails to improve as anticipated.  I provided 23 minutes of non-face-to-face time during this encounter. The call started at 922 and ended at 30. The patient was located at home and the provider was located office.   Subjective:   Patient ID:  Katrina Gamble is a 78 y.o. (DOB 04/10/1941) female.  Chief Complaint:  Chief Complaint  Patient presents with  . Follow-up    Medication Management  . Depression    Slightly increased  . Anxiety    Medication Management  . Medication Refill    Methylphenidate    Depression         Associated symptoms include no decreased concentration and no suicidal ideas.  Past medical history includes anxiety.   Anxiety  Patient reports no confusion, decreased concentration, nervous/anxious behavior or suicidal ideas.    Medication Refill  Pertinent negatives include no weakness.   Katrina Gamble presents today for follow-up of ADD and mood.  A lot of variability for the last months with mood for a few days and then it goes away.  No pattern. Not feeling stressed nor anxious.   Not out of the house much for 6-8weeks.  Occ walk.  Not real self motivated person.  But easily depressed.  Managed if not consistent.  Consistent with meds.  Patient reports stable mood and denies  irritable moods.  Patient denies any recent difficulty with anxiety.  Patient denies difficulty with sleep initiation or maintenance. Denies appetite disturbance.  Patient reports that energy and motivation have been good.  Patient denies any difficulty with concentration.  Patient denies any suicidal ideation.  PCP Dr. Rex Kras  Did labs recently and they were good.  Did thyroid not lithium levels lately.  Did reduce the Ritalin from 15mg  QID to 10 mg QID, probably in November bc change in generic methylphenidate to [place].  May have started the sx.  Could't find the preferred manufactur er at other pharmacies either.Dosage seems ok now.  Rx written for 20 mg TID.  Now taking 15 mg 4 times daily.  20 mg gives a bigger crash.  Youngest D relapse breast CA.  Stressful.  New great grand D [place].  Psych med history: Trintellix caused anxiety, Abilify no response, history of Dexedrine during pregnancy which caused weight gain, Mellaril, Nardil, Prozac with side effects, try cyclic antidepressants, Wellbutrin which caused tremors, imipramine, Pristiq 100, Vyvanse, Cerefolin NAC, Viibryd, lithium, Ritalin  Review of Systems:  Review of Systems  Neurological: Positive for tremors. Negative for weakness.  Psychiatric/Behavioral: Positive for depression. Negative for agitation, behavioral problems, confusion, decreased concentration, dysphoric mood, hallucinations, self-injury, sleep disturbance and suicidal ideas. The patient  is not nervous/anxious and is not hyperactive.     Medications: I have reviewed the patient's current medications.  Current Outpatient Medications  Medication Sig Dispense Refill  . levothyroxine (SYNTHROID, LEVOTHROID) 25 MCG tablet Take 25 mcg by mouth daily before breakfast.    .  lithium carbonate 300 MG capsule Take 1 capsule (300 mg total) by mouth daily. 90 capsule 1  . methylphenidate (RITALIN) 20 MG tablet Take 1 tablet (20 mg total) by mouth 3 (three) times daily with meals. 90 tablet 0  . [START ON 03/09/2019] methylphenidate (METADATE ER) 20 MG ER tablet Take 1 tablet (20 mg total) by mouth daily. 90 tablet 0  . [START ON 02/09/2019] methylphenidate (RITALIN) 20 MG tablet Take 1 tablet (20 mg total) by mouth 3 (three) times daily with meals. 90 tablet 0   No current facility-administered medications for this visit.     Medication Side Effects: tremor is affecting handwriting but managed.  Numbness in toes.  Hands and feet always cold.  Allergies:  Allergies  Allergen Reactions  . Codeine Other (See Comments)    Abdominal pain   . Latex Hives  . Septra [Sulfamethoxazole-Trimethoprim] Rash    Past Medical History:  Diagnosis Date  . Depression   . Osteopenia 11/2017   T score -1.1 FRAX 24% / 14%  . Tremor, essential   . Vitamin D deficiency 12/2017    Family History  Problem Relation Age of Onset  . Hypertension Mother   . Thyroid disease Mother   . Breast cancer Mother 59  . Breast cancer Daughter 24  . Thyroid disease Daughter   . Other Father        Brain tumor  . Parkinson's disease Father   . Dementia Father   . Cancer Daughter        Melanoma  . Heart disease Paternal Grandfather     Social History   Socioeconomic History  . Marital status: Widowed    Spouse name: Not on file  . Number of children: Not on file  . Years of education: Not on file  . Highest education level: Not on file  Occupational History  . Not on file  Social Needs  . Financial resource strain: Not on file  . Food insecurity:    Worry: Not on file    Inability: Not on file  . Transportation needs:    Medical: Not on file    Non-medical: Not on file  Tobacco Use  . Smoking status: Former Smoker    Packs/day: 0.50    Years: 30.00    Pack years: 15.00     Types: Cigarettes    Last attempt to quit: 2012    Years since quitting: 8.3  . Smokeless tobacco: Never Used  Substance and Sexual Activity  . Alcohol use: Yes    Alcohol/week: 0.0 standard drinks    Comment: Occas.  . Drug use: No  . Sexual activity: Never    Birth control/protection: Post-menopausal    Comment: 1st intercourse 78 yo-Fewer than 5 partners  Lifestyle  . Physical activity:    Days per week: Not on file    Minutes per session: Not on file  . Stress: Not on file  Relationships  . Social connections:    Talks on phone: Not on file    Gets together: Not on file    Attends religious service: Not on file    Active member of club or organization: Not on file  Attends meetings of clubs or organizations: Not on file    Relationship status: Not on file  . Intimate partner violence:    Fear of current or ex partner: Not on file    Emotionally abused: Not on file    Physically abused: Not on file    Forced sexual activity: Not on file  Other Topics Concern  . Not on file  Social History Narrative  . Not on file    Past Medical History, Surgical history, Social history, and Family history were reviewed and updated as appropriate.   Please see review of systems for further details on the patient's review from today.   Objective:   Physical Exam:  There were no vitals taken for this visit.  Physical Exam Neurological:     Mental Status: She is alert and oriented to person, place, and time.     Cranial Nerves: No dysarthria.  Psychiatric:        Attention and Perception: Attention normal.        Mood and Affect: Mood is depressed.        Speech: Speech normal.        Behavior: Behavior is cooperative.        Thought Content: Thought content normal. Thought content is not paranoid or delusional. Thought content does not include homicidal or suicidal ideation. Thought content does not include homicidal or suicidal plan.        Cognition and Memory:  Cognition and memory normal.        Judgment: Judgment normal.     Comments: Good insight intermittent depression.     Lab Review:  No results found for: NA, K, CL, CO2, GLUCOSE, BUN, CREATININE, CALCIUM, PROT, ALBUMIN, AST, ALT, ALKPHOS, BILITOT, GFRNONAA, GFRAA  No results found for: WBC, RBC, HGB, HCT, PLT, MCV, MCH, MCHC, RDW, LYMPHSABS, MONOABS, EOSABS, BASOSABS  No results found for: POCLITH, LITHIUM   No results found for: PHENYTOIN, PHENOBARB, VALPROATE, CBMZ   .res Assessment: Plan:    Major depressive disorder, recurrent episode, moderate (HCC) - Plan: methylphenidate (RITALIN) 20 MG tablet, methylphenidate (RITALIN) 20 MG tablet, methylphenidate (METADATE ER) 20 MG ER tablet  Attention deficit hyperactivity disorder (ADHD), predominantly inattentive type - Plan: methylphenidate (RITALIN) 20 MG tablet, methylphenidate (RITALIN) 20 MG tablet, methylphenidate (METADATE ER) 20 MG ER tablet  Generalized anxiety disorder  Lithium-induced tremor   Joel has a long history over decades of cycling major depression and treatment resistance to meds noted above of nearly every antidepressant category.  Her best response so far has been from low-dose lithium with a stimulant.  She last visit had to change from an unknown preferred generic to the less desirable [place] generic overall satisfied with meds.  She has noted more mood cycling lately but typical depressions do not last more than about 3 days and then resolve on their own.  They do not follow any pattern and do not appear to have any precipitant.  She does not push for any medication changes today.   Counseled patient regarding potential benefits, risks, and side effects of lithium to include potential risk of lithium affecting thyroid and renal function.  Discussed need for periodic lab monitoring to determine drug level and to assess for potential adverse effects.  Counseled patient regarding signs and symptoms of lithium  toxicity and advised that they notify office immediately or seek urgent medical attention if experiencing these signs and symptoms.  Patient advised to contact office with any questions or concerns.  However she is at a low dosage so I do not believe we are compelled to do frequent monitoring.  Disc varying lithium levels with diet and water intake.  Specifically her lithium level September 6 on this dosage of lithium was 0.3.  Her urinalysis was normal she had a normal CBC, comprehensive metabolic panel was normal including creatinine 0.83 calcium 9.9 and TSH was normal at 2.83.  Lithium level in April 2019 was 0.2 at this dosage.  We will not increase the lithium because she is currently having tremor at the low dose.  But we should consider increasing the lithium if mood cycling worsens or the depressions become longer and she is agreeable to this plan.  Discussed potential benefits, risks, and side effects of stimulants with patient to include increased heart rate, palpitations, insomnia, increased anxiety, increased irritability, or decreased appetite.  Instructed patient to contact office if experiencing any significant tolerability issues.  This appt was 22 mins.  FU 3-4 mos or sooner prn  Lynder Parents, MD, DFAPA '   Please see After Visit Summary for patient specific instructions.  No future appointments.  No orders of the defined types were placed in this encounter.     -------------------------------

## 2019-04-13 ENCOUNTER — Other Ambulatory Visit: Payer: Self-pay

## 2019-04-13 ENCOUNTER — Telehealth: Payer: Self-pay | Admitting: Psychiatry

## 2019-04-13 DIAGNOSIS — F900 Attention-deficit hyperactivity disorder, predominantly inattentive type: Secondary | ICD-10-CM

## 2019-04-13 DIAGNOSIS — F331 Major depressive disorder, recurrent, moderate: Secondary | ICD-10-CM

## 2019-04-13 MED ORDER — METHYLPHENIDATE HCL 20 MG PO TABS: 20.0000 mg | ORAL_TABLET | Freq: Three times a day (TID) | ORAL | 0 refills | Status: DC

## 2019-04-13 NOTE — Telephone Encounter (Signed)
Updated rx, pended for provider

## 2019-04-13 NOTE — Telephone Encounter (Signed)
Patient called and said that her last refill of her ritalin was sent in wrong. .She is 20 mg of rialin 3 x daily not one times daily.  Please call her at [phone]

## 2019-05-15 ENCOUNTER — Encounter: Payer: Self-pay | Admitting: Psychiatry

## 2019-05-15 ENCOUNTER — Other Ambulatory Visit: Payer: Self-pay

## 2019-05-15 ENCOUNTER — Ambulatory Visit (INDEPENDENT_AMBULATORY_CARE_PROVIDER_SITE_OTHER): Payer: Medicare Other | Admitting: Psychiatry

## 2019-05-15 VITALS — BP 104/76 | HR 74

## 2019-05-15 DIAGNOSIS — F411 Generalized anxiety disorder: Secondary | ICD-10-CM | POA: Diagnosis not present

## 2019-05-15 DIAGNOSIS — G250 Essential tremor: Secondary | ICD-10-CM | POA: Diagnosis not present

## 2019-05-15 DIAGNOSIS — Z79899 Other long term (current) drug therapy: Secondary | ICD-10-CM | POA: Diagnosis not present

## 2019-05-15 DIAGNOSIS — G251 Drug-induced tremor: Secondary | ICD-10-CM | POA: Diagnosis not present

## 2019-05-15 DIAGNOSIS — F331 Major depressive disorder, recurrent, moderate: Secondary | ICD-10-CM | POA: Diagnosis not present

## 2019-05-15 DIAGNOSIS — F900 Attention-deficit hyperactivity disorder, predominantly inattentive type: Secondary | ICD-10-CM

## 2019-05-15 MED ORDER — LITHIUM CARBONATE 150 MG PO CAPS: 450.0000 mg | ORAL_CAPSULE | Freq: Every day | ORAL | 0 refills | Status: DC

## 2019-05-15 MED ORDER — METHYLPHENIDATE HCL 20 MG PO TABS: 20.0000 mg | ORAL_TABLET | Freq: Three times a day (TID) | ORAL | 0 refills | Status: DC

## 2019-05-15 NOTE — Patient Instructions (Addendum)
Option lamotrigine  Increase lithium to 450 mg daily which would be 300+150 mg capsules 1 of each daily. Or 3 of the 150 mg capsules daily  Wait at least 7 days and get lab tests

## 2019-05-15 NOTE — Progress Notes (Signed)
Katrina Gamble [id number] [birth year] 78 y.o.     Subjective:   Patient ID:  Katrina Gamble is a 78 y.o. (DOB Feb 27, 1941) female.  Chief Complaint:  Chief Complaint  Patient presents with  . Follow-up    Medication Management  . Depression    Medication Management  . Medication Refill    Ritalin and Lithium  . ADHD    Depression        Associated symptoms include fatigue.  Associated symptoms include no decreased concentration and no suicidal ideas.  Past medical history includes anxiety.   Anxiety Patient reports no confusion, decreased concentration, nervous/anxious behavior or suicidal ideas.    Medication Refill Associated symptoms include fatigue. Pertinent negatives include no weakness.   Katrina Gamble presents today for follow-up of ADD and mood.  Last seen Jan 12, 2019.  No meds were changed.  Not as good as I'd like to be.  Several times started to call for earlier appt..  Then got better.  Cycling good and bad. usuallly not reactive but lately sad a couple of months.  Several weeks of sad and then suddenly gone.  Overall she has had more depressive cycle since she she was here at the last visit.  They do not follow a clear pattern except that after several weeks she will have a few up days but then she is back into depression again.   Consistent with meds.  Patient reports stable mood and denies  irritable moods.  Patient denies any recent difficulty with anxiety.  Patient denies difficulty with sleep initiation or maintenance. Denies appetite disturbance.  Patient reports that energy and motivation have been good.  Patient denies any difficulty with concentration.  Patient denies any suicidal ideation.  PCP Dr. Rex Kras  Did labs recently and they were good.  Did thyroid not lithium levels lately.  Did reduce the Ritalin from 15mg  QID to 10 mg QID, probably in November bc change in generic methylphenidate to [place].  May have started the sx.   Could't find the preferred manufactur er at other pharmacies either.Dosage seems ok now.  Rx written for 20 mg TID.  Now taking 15 mg 4 times daily.  20 mg gives a bigger crash.  Youngest D relapse breast CA.  Stressful.  New great grand D [place].  Psych med history: Trintellix caused anxiety, Abilify no response, history of Dexedrine during pregnancy which caused weight gain, Mellaril, Nardil, Prozac with side effects, try cyclic antidepressants, Wellbutrin which caused tremors, imipramine, Pristiq 100, Vyvanse, Cerefolin NAC, Viibryd, lithium, Ritalin  Review of Systems:  Review of Systems  Constitutional: Positive for fatigue.  Neurological: Positive for tremors. Negative for weakness.  Psychiatric/Behavioral: Positive for depression. Negative for agitation, behavioral problems, confusion, decreased concentration, dysphoric mood, hallucinations, self-injury, sleep disturbance and suicidal ideas. The patient is not nervous/anxious and is not hyperactive.     Medications: I have reviewed the patient's current medications.  Current Outpatient Medications  Medication Sig Dispense Refill  . levothyroxine (SYNTHROID, LEVOTHROID) 25 MCG tablet Take 25 mcg by mouth daily before breakfast.    . lithium carbonate 150 MG capsule Take 3 capsules (450 mg total) by mouth daily. 270 capsule 0  . methylphenidate (RITALIN) 20 MG tablet Take 1 tablet (20 mg total) by mouth 3 (three) times daily with meals. 90 tablet 0  . [START ON 06/12/2019] methylphenidate (RITALIN) 20 MG tablet Take 1 tablet (20 mg total) by mouth 3 (three) times daily with meals. 90 tablet 0  No current facility-administered medications for this visit.     Medication Side Effects: tremor is affecting handwriting but managed.  Numbness in toes.  Hands and feet always cold.  Allergies:  Allergies  Allergen Reactions  . Codeine Other (See Comments)    Abdominal pain   . Latex Hives  . Septra [Sulfamethoxazole-Trimethoprim] Rash     Past Medical History:  Diagnosis Date  . Depression   . Osteopenia 11/2017   T score -1.1 FRAX 24% / 14%  . Tremor, essential   . Vitamin D deficiency 12/2017    Family History  Problem Relation Age of Onset  . Hypertension Mother   . Thyroid disease Mother   . Breast cancer Mother 2  . Breast cancer Daughter 85  . Thyroid disease Daughter   . Other Father        Brain tumor  . Parkinson's disease Father   . Dementia Father   . Cancer Daughter        Melanoma  . Heart disease Paternal Grandfather     Social History   Socioeconomic History  . Marital status: Widowed    Spouse name: Not on file  . Number of children: Not on file  . Years of education: Not on file  . Highest education level: Not on file  Occupational History  . Not on file  Social Needs  . Financial resource strain: Not on file  . Food insecurity    Worry: Not on file    Inability: Not on file  . Transportation needs    Medical: Not on file    Non-medical: Not on file  Tobacco Use  . Smoking status: Former Smoker    Packs/day: 0.50    Years: 30.00    Pack years: 15.00    Types: Cigarettes    Quit date: 2012    Years since quitting: 8.6  . Smokeless tobacco: Never Used  Substance and Sexual Activity  . Alcohol use: Yes    Alcohol/week: 0.0 standard drinks    Comment: Occas.  . Drug use: No  . Sexual activity: Never    Birth control/protection: Post-menopausal    Comment: 1st intercourse 78 yo-Fewer than 5 partners  Lifestyle  . Physical activity    Days per week: Not on file    Minutes per session: Not on file  . Stress: Not on file  Relationships  . Social Herbalist on phone: Not on file    Gets together: Not on file    Attends religious service: Not on file    Active member of club or organization: Not on file    Attends meetings of clubs or organizations: Not on file    Relationship status: Not on file  . Intimate partner violence    Fear of current or ex  partner: Not on file    Emotionally abused: Not on file    Physically abused: Not on file    Forced sexual activity: Not on file  Other Topics Concern  . Not on file  Social History Narrative  . Not on file    Past Medical History, Surgical history, Social history, and Family history were reviewed and updated as appropriate.   Please see review of systems for further details on the patient's review from today.   Objective:   Physical Exam:  BP 104/76   Pulse 74   Physical Exam Constitutional:      General: She is not in acute distress.  Appearance: She is well-developed and normal weight.  Musculoskeletal:        General: No deformity.  Neurological:     Mental Status: She is alert and oriented to person, place, and time.     Cranial Nerves: No dysarthria.     Motor: Tremor present.     Coordination: Coordination normal.  Psychiatric:        Attention and Perception: Attention and perception normal. She does not perceive auditory or visual hallucinations.        Mood and Affect: Mood is depressed. Mood is not anxious. Affect is not labile, blunt, angry or inappropriate.        Speech: Speech normal.        Behavior: Behavior normal. Behavior is cooperative.        Thought Content: Thought content normal. Thought content is not paranoid or delusional. Thought content does not include homicidal or suicidal ideation. Thought content does not include homicidal or suicidal plan.        Cognition and Memory: Cognition and memory normal.        Judgment: Judgment normal.     Comments: Good insight intermittent depression.     Lab Review:  No results found for: NA, K, CL, CO2, GLUCOSE, BUN, CREATININE, CALCIUM, PROT, ALBUMIN, AST, ALT, ALKPHOS, BILITOT, GFRNONAA, GFRAA  No results found for: WBC, RBC, HGB, HCT, PLT, MCV, MCH, MCHC, RDW, LYMPHSABS, MONOABS, EOSABS, BASOSABS  No results found for: POCLITH, LITHIUM   No results found for: PHENYTOIN, PHENOBARB, VALPROATE, CBMZ    .res Assessment: Plan:    Major depressive disorder, recurrent episode, moderate (HCC) - Plan: lithium carbonate 150 MG capsule, Basic metabolic panel, Lithium level, methylphenidate (RITALIN) 20 MG tablet, methylphenidate (RITALIN) 20 MG tablet  Attention deficit hyperactivity disorder (ADHD), predominantly inattentive type - Plan: methylphenidate (RITALIN) 20 MG tablet, methylphenidate (RITALIN) 20 MG tablet  Generalized anxiety disorder  Lithium-induced tremor  Lithium use - Plan: Basic metabolic panel, Lithium level  Tremor, essential   Katrina Gamble has a long history over decades of cycling major depression and treatment resistance to meds noted above of nearly every antidepressant category.  Her best response so far has been from low-dose lithium with a stimulant.  She last visit had to change from an unknown preferred generic to the less desirable [place] generic overall satisfied with meds.  She has noted more mood cycling lately but are worse than last visit with more prolonged periods of depression.  N.  They do not follow any pattern and do not appear to have any precipitant.  She does push for medication changes today.  While she does not identify herself is bipolar because her upswings seem only normal to her.  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  Options for cycling depression increase lithium slightly bc it previously helped and low level vs trial lamotrigine.  Disc her fears of kidney function.  Was told she had reduced GFR.   Her last creatinine on the chart was much less than 1.0.  This was discussed with her in detail. Increase lithium to 450 mg daily. History of essential tremor prior to lithium.  Inadequate trial propranolol. Option  propranolol.  She doesn't want to add more meds.  Takes little med.  She understands the increase  in the lithium may worsen her essential tremor.  Check lithium level and labs Counseled patient regarding potential benefits, risks, and side effects of lithium to include potential risk of lithium affecting thyroid and renal function.  Discussed need for periodic lab monitoring to determine drug level and to assess for potential adverse effects.  Counseled patient regarding signs and symptoms of lithium toxicity and advised that they notify office immediately or seek urgent medical attention if experiencing these signs and symptoms.  Patient advised to contact office with any questions or concerns.   Specifically her lithium level September 6 on this dosage of lithium was 0.3.  Her urinalysis was normal she had a normal CBC, comprehensive metabolic panel was normal including creatinine 0.83 calcium 9.9 and TSH was normal at 2.83.  Lithium level in April 2019 was 0.2 at this dosage.  We will not increase the lithium because she is currently having tremor at the low dose.  Discussed potential benefits, risks, and side effects of stimulants with patient to include increased heart rate, palpitations, insomnia, increased anxiety, increased irritability, or decreased appetite.  Instructed patient to contact office if experiencing any significant tolerability issues.  FU 2 months Mos or sooner prn  Katrina Parents, MD, DFAPA '   Please see After Visit Summary for patient specific instructions.  No future appointments.  Orders Placed This Encounter  Procedures  . Basic metabolic panel  . Lithium level      -------------------------------

## 2019-06-16 DIAGNOSIS — H2513 Age-related nuclear cataract, bilateral: Secondary | ICD-10-CM | POA: Diagnosis not present

## 2019-06-18 ENCOUNTER — Encounter: Payer: Self-pay | Admitting: Gynecology

## 2019-06-18 DIAGNOSIS — Z13220 Encounter for screening for lipoid disorders: Secondary | ICD-10-CM | POA: Diagnosis not present

## 2019-06-18 DIAGNOSIS — E039 Hypothyroidism, unspecified: Secondary | ICD-10-CM | POA: Diagnosis not present

## 2019-06-18 DIAGNOSIS — M8580 Other specified disorders of bone density and structure, unspecified site: Secondary | ICD-10-CM | POA: Diagnosis not present

## 2019-06-18 DIAGNOSIS — J309 Allergic rhinitis, unspecified: Secondary | ICD-10-CM | POA: Diagnosis not present

## 2019-06-18 DIAGNOSIS — Z0000 Encounter for general adult medical examination without abnormal findings: Secondary | ICD-10-CM | POA: Diagnosis not present

## 2019-06-18 DIAGNOSIS — Z23 Encounter for immunization: Secondary | ICD-10-CM | POA: Diagnosis not present

## 2019-06-18 DIAGNOSIS — F339 Major depressive disorder, recurrent, unspecified: Secondary | ICD-10-CM | POA: Diagnosis not present

## 2019-06-18 DIAGNOSIS — Z79899 Other long term (current) drug therapy: Secondary | ICD-10-CM | POA: Diagnosis not present

## 2019-06-19 ENCOUNTER — Other Ambulatory Visit: Payer: Self-pay | Admitting: Gynecology

## 2019-06-19 DIAGNOSIS — Z1231 Encounter for screening mammogram for malignant neoplasm of breast: Secondary | ICD-10-CM

## 2019-07-15 ENCOUNTER — Other Ambulatory Visit: Payer: Self-pay

## 2019-07-15 ENCOUNTER — Encounter: Payer: Self-pay | Admitting: Psychiatry

## 2019-07-15 ENCOUNTER — Ambulatory Visit (INDEPENDENT_AMBULATORY_CARE_PROVIDER_SITE_OTHER): Payer: Medicare Other | Admitting: Psychiatry

## 2019-07-15 VITALS — BP 126/79 | HR 77

## 2019-07-15 DIAGNOSIS — Z79899 Other long term (current) drug therapy: Secondary | ICD-10-CM

## 2019-07-15 DIAGNOSIS — G250 Essential tremor: Secondary | ICD-10-CM

## 2019-07-15 DIAGNOSIS — G251 Drug-induced tremor: Secondary | ICD-10-CM

## 2019-07-15 DIAGNOSIS — F900 Attention-deficit hyperactivity disorder, predominantly inattentive type: Secondary | ICD-10-CM

## 2019-07-15 DIAGNOSIS — R7989 Other specified abnormal findings of blood chemistry: Secondary | ICD-10-CM | POA: Diagnosis not present

## 2019-07-15 DIAGNOSIS — F331 Major depressive disorder, recurrent, moderate: Secondary | ICD-10-CM | POA: Diagnosis not present

## 2019-07-15 MED ORDER — LITHIUM CARBONATE 300 MG PO CAPS: 600.0000 mg | ORAL_CAPSULE | Freq: Every day | ORAL | 0 refills | Status: DC

## 2019-07-15 NOTE — Progress Notes (Signed)
Katrina Gamble [id number] 03-02-1941 78 y.o.     Subjective:   Patient ID:  Katrina Gamble is a 78 y.o. (DOB 12/15/1940) female.  Chief Complaint:  Chief Complaint  Patient presents with  . Follow-up    Medication Management  . Depression    Medication Management  . Anxiety    Medication Management  . Other    OCD  . Medication Refill    Ritlan    Depression        Associated symptoms include fatigue.  Associated symptoms include no decreased concentration and no suicidal ideas.  Past medical history includes anxiety.   Medication Refill Associated symptoms include fatigue. Pertinent negatives include no weakness.  Anxiety Patient reports no confusion, decreased concentration, nervous/anxious behavior or suicidal ideas.     Katrina Gamble presents today for follow-up of ADD and mood.  Last seen May 15, 2019.  She continued to have cycles of depression without clear precipitant.  It was suggested that she increase lithium to 450 mg daily check a lithium level.  Honestly not sure what's going on.  3 weeks after increase had several days of "life was perfect" but not euphoric and then it went back to the same as before.  Found out she was hypothyroid boderline and wondered if it was caused by the lithium.  Inner shakiness but core of body is cold.  Essential tremor comes and goes and not noticeably worse.    Not as good as I'd like to be.  Several times started to call for earlier appt..  Then got better.  Cycling good and bad. usuallly not reactive but lately sad a couple of months.  Several weeks of sad and then suddenly gone.  Overall she has had more depressive cycle since she she was here at the last visit.  They do not follow a clear pattern except that after several weeks she will have a few up days but then she is back into depression again.   Consistent with meds.  Patient reports stable mood and denies  irritable moods.  Patient denies any  recent difficulty with anxiety.  Patient denies difficulty with sleep initiation or maintenance. Denies appetite disturbance.  Patient reports that energy and motivation have been variable.  Patient denies any difficulty with concentration.  Patient denies any suicidal ideation.  Did reduce the Ritalin from 15mg  QID to 10 mg QID, probably in November bc change in generic methylphenidate to [place].  May have started the sx.  Could't find the preferred manufactur er at other pharmacies either.Dosage seems ok now.  Rx written for 20 mg TID.  Now taking 15 mg 4 times daily.  20 mg gives a bigger crash.  Youngest D relapse breast CA.  Stressful.  New great grand D [place].  Psych med history: Trintellix caused anxiety, Abilify no response, history of Dexedrine during pregnancy which caused weight gain, Mellaril, Nardil, Prozac with side effects, try cyclic antidepressants, Wellbutrin which caused tremors, imipramine, Pristiq 100, Vyvanse, Cerefolin NAC, Viibryd, lithium, Ritalin  Review of Systems:  Review of Systems  Constitutional: Positive for fatigue.  Neurological: Positive for tremors. Negative for weakness.  Psychiatric/Behavioral: Positive for depression. Negative for agitation, behavioral problems, confusion, decreased concentration, dysphoric mood, hallucinations, self-injury, sleep disturbance and suicidal ideas. The patient is not nervous/anxious and is not hyperactive.     Medications: I have reviewed the patient's current medications.  Current Outpatient Medications  Medication Sig Dispense Refill  . levothyroxine (SYNTHROID, LEVOTHROID) 25 MCG  tablet Take 25 mcg by mouth daily before breakfast.    . lithium carbonate 150 MG capsule Take 3 capsules (450 mg total) by mouth daily. 270 capsule 0  . methylphenidate (RITALIN) 20 MG tablet Take 1 tablet (20 mg total) by mouth 3 (three) times daily with meals. 90 tablet 0  . methylphenidate (RITALIN) 20 MG tablet Take 1 tablet (20 mg total)  by mouth 3 (three) times daily with meals. 90 tablet 0   No current facility-administered medications for this visit.     Medication Side Effects: tremor is affecting handwriting but managed.  Numbness in toes.  Hands and feet always cold.  Allergies:  Allergies  Allergen Reactions  . Codeine Other (See Comments)    Abdominal pain   . Latex Hives  . Septra [Sulfamethoxazole-Trimethoprim] Rash    Past Medical History:  Diagnosis Date  . Depression   . Osteopenia 11/2017   T score -1.1 FRAX 24% / 14%  . Tremor, essential   . Vitamin D deficiency 12/2017    Family History  Problem Relation Age of Onset  . Hypertension Mother   . Thyroid disease Mother   . Breast cancer Mother 48  . Breast cancer Daughter 79  . Thyroid disease Daughter   . Other Father        Brain tumor  . Parkinson's disease Father   . Dementia Father   . Cancer Daughter        Melanoma  . Heart disease Paternal Grandfather     Social History   Socioeconomic History  . Marital status: Widowed    Spouse name: Not on file  . Number of children: Not on file  . Years of education: Not on file  . Highest education level: Not on file  Occupational History  . Not on file  Social Needs  . Financial resource strain: Not on file  . Food insecurity    Worry: Not on file    Inability: Not on file  . Transportation needs    Medical: Not on file    Non-medical: Not on file  Tobacco Use  . Smoking status: Former Smoker    Packs/day: 0.50    Years: 30.00    Pack years: 15.00    Types: Cigarettes    Quit date: 2012    Years since quitting: 8.8  . Smokeless tobacco: Never Used  Substance and Sexual Activity  . Alcohol use: Yes    Alcohol/week: 0.0 standard drinks    Comment: Occas.  . Drug use: No  . Sexual activity: Never    Birth control/protection: Post-menopausal    Comment: 1st intercourse 78 yo-Fewer than 5 partners  Lifestyle  . Physical activity    Days per week: Not on file     Minutes per session: Not on file  . Stress: Not on file  Relationships  . Social Herbalist on phone: Not on file    Gets together: Not on file    Attends religious service: Not on file    Active member of club or organization: Not on file    Attends meetings of clubs or organizations: Not on file    Relationship status: Not on file  . Intimate partner violence    Fear of current or ex partner: Not on file    Emotionally abused: Not on file    Physically abused: Not on file    Forced sexual activity: Not on file  Other Topics Concern  .  Not on file  Social History Narrative  . Not on file    Past Medical History, Surgical history, Social history, and Family history were reviewed and updated as appropriate.   Please see review of systems for further details on the patient's review from today.   Objective:   Physical Exam:  BP 126/79   Pulse 77   Physical Exam Constitutional:      General: She is not in acute distress.    Appearance: She is well-developed and normal weight.  Musculoskeletal:        General: No deformity.  Neurological:     Mental Status: She is alert and oriented to person, place, and time.     Cranial Nerves: No dysarthria.     Motor: Tremor present.     Coordination: Coordination normal.  Psychiatric:        Attention and Perception: Attention and perception normal. She does not perceive auditory or visual hallucinations.        Mood and Affect: Mood is depressed. Mood is not anxious. Affect is not labile, blunt, angry or inappropriate.        Speech: Speech normal.        Behavior: Behavior normal. Behavior is cooperative.        Thought Content: Thought content normal. Thought content is not paranoid or delusional. Thought content does not include homicidal or suicidal ideation. Thought content does not include homicidal or suicidal plan.        Cognition and Memory: Cognition and memory normal.        Judgment: Judgment normal.      Comments: Good insight intermittent depression.     Lab Review:  No results found for: NA, K, CL, CO2, GLUCOSE, BUN, CREATININE, CALCIUM, PROT, ALBUMIN, AST, ALT, ALKPHOS, BILITOT, GFRNONAA, GFRAA  No results found for: WBC, RBC, HGB, HCT, PLT, MCV, MCH, MCHC, RDW, LYMPHSABS, MONOABS, EOSABS, BASOSABS  No results found for: POCLITH, LITHIUM   No results found for: PHENYTOIN, PHENOBARB, VALPROATE, CBMZ   .res Assessment: Plan:    Major depressive disorder, recurrent episode, moderate (HCC)  Attention deficit hyperactivity disorder (ADHD), predominantly inattentive type  Lithium-induced tremor  Tremor, essential  Lithium use  Low serum vitamin D   .Greater than 50% of 30-minute face to face time with patient was spent on counseling and coordination of care. We discussed Divya has a long history over decades of cycling major depression and treatment resistance to meds noted above of nearly every antidepressant category.  Her best response so far has been from low-dose lithium with a stimulant.  At prior visit had to change from an unknown preferred generic to the less desirable [place] generic overall satisfied with meds.  She has noted more mood cycling lately but are worse than last visit with more prolonged periods of depression.  N.  They do not follow any pattern and do not appear to have any precipitant.  She wanted some sort of med change at the last visit.  We elected to increase lithium to 450 mg daily and check a blood level.  While she does not identify herself is bipolar because her upswings seem only normal to her.  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  No lithium level was received.  The fact  that she had 3 days of improved mood after  increasing the lithium about 3 weeks later and is otherwise tolerated it and has a low normal blood level is further encouragement to go up 1 more step on the lithium to see if we can get more consistent results.  Recommend increasing to 600 mg daily.  She is still somewhat fearful as previously noted of changing medication but agrees.  Options for cycling depression include continuing lithium bc it previously helped at low level vs trial lamotrigine.  Disc her fears of kidney function.  Was told she had reduced GFR.   Her last creatinine on the chart was much less than 1.0.  This was discussed with her in detail.  History of essential tremor prior to lithium.  Inadequate trial propranolol. Option propranolol.  She doesn't want to add more meds.  Takes little med.  She understands the increase in the lithium may worsen her essential tremor.  Check lithium level and labs.  She says it was 0.6 at PCP. Counseled patient regarding potential benefits, risks, and side effects of lithium to include potential risk of lithium affecting thyroid and renal function.  Discussed need for periodic lab monitoring to determine drug level and to assess for potential adverse effects.  Counseled patient regarding signs and symptoms of lithium toxicity and advised that they notify office immediately or seek urgent medical attention if experiencing these signs and symptoms.  Patient advised to contact office with any questions or concerns.  Discussed drug interaction issues that could elevate lithium levels.  Specifically her lithium level September 6 on this dosage of lithium was 0.3.  Her urinalysis was normal she had a normal CBC, comprehensive metabolic panel was normal including creatinine 0.83 calcium 9.9 and TSH was normal at 2.83.  Lithium level in April 2019 was 0.2 at this dosage.  We will not increase the lithium because she is currently having tremor at the low dose.  Rec increase lithium to 600 mg daily for a  week and check blood level. If this fails to help or is intolerable then recommend lamotrigine trial as noted.  Discussed potential benefits, risks, and side effects of stimulants with patient to include increased heart rate, palpitations, insomnia, increased anxiety, increased irritability, or decreased appetite.  Instructed patient to contact office if experiencing any significant tolerability issues.  Restart Vitamin D. Disc reasons for doing so for mental and physical health in detail.   She is generally fearful of meds  And reluctance.  FU 2 months Mos or sooner prn  Lynder Parents, MD, DFAPA '   Please see After Visit Summary for patient specific instructions.  Future Appointments  Date Time Provider [place]  08/04/2019  1:50 PM [facility]    No orders of the defined types were placed in this encounter.     -------------------------------

## 2019-07-16 DIAGNOSIS — Z23 Encounter for immunization: Secondary | ICD-10-CM | POA: Diagnosis not present

## 2019-07-16 DIAGNOSIS — Z13220 Encounter for screening for lipoid disorders: Secondary | ICD-10-CM | POA: Diagnosis not present

## 2019-07-16 DIAGNOSIS — Z79899 Other long term (current) drug therapy: Secondary | ICD-10-CM | POA: Diagnosis not present

## 2019-07-16 DIAGNOSIS — M8580 Other specified disorders of bone density and structure, unspecified site: Secondary | ICD-10-CM | POA: Diagnosis not present

## 2019-07-16 DIAGNOSIS — Z0000 Encounter for general adult medical examination without abnormal findings: Secondary | ICD-10-CM | POA: Diagnosis not present

## 2019-07-16 DIAGNOSIS — J309 Allergic rhinitis, unspecified: Secondary | ICD-10-CM | POA: Diagnosis not present

## 2019-07-16 DIAGNOSIS — E039 Hypothyroidism, unspecified: Secondary | ICD-10-CM | POA: Diagnosis not present

## 2019-07-16 DIAGNOSIS — F339 Major depressive disorder, recurrent, unspecified: Secondary | ICD-10-CM | POA: Diagnosis not present

## 2019-08-03 ENCOUNTER — Telehealth: Payer: Self-pay | Admitting: Psychiatry

## 2019-08-03 ENCOUNTER — Other Ambulatory Visit: Payer: Self-pay

## 2019-08-03 DIAGNOSIS — F331 Major depressive disorder, recurrent, moderate: Secondary | ICD-10-CM

## 2019-08-03 DIAGNOSIS — F900 Attention-deficit hyperactivity disorder, predominantly inattentive type: Secondary | ICD-10-CM

## 2019-08-03 NOTE — Telephone Encounter (Signed)
Pt needs Ritalin RX RF sent in please Katrina Gamble on file.

## 2019-08-03 NOTE — Telephone Encounter (Signed)
Pended for approval, has apt 12/10

## 2019-08-04 ENCOUNTER — Other Ambulatory Visit: Payer: Self-pay

## 2019-08-04 ENCOUNTER — Ambulatory Visit
Admission: RE | Admit: 2019-08-04 | Discharge: 2019-08-04 | Disposition: A | Discharge status: Home / Self Care | Payer: Medicare Other | Source: Ambulatory Visit | Attending: Gynecology | Admitting: Gynecology

## 2019-08-04 DIAGNOSIS — Z1231 Encounter for screening mammogram for malignant neoplasm of breast: Secondary | ICD-10-CM

## 2019-08-04 MED ORDER — METHYLPHENIDATE HCL 20 MG PO TABS: 20.0000 mg | ORAL_TABLET | Freq: Three times a day (TID) | ORAL | 0 refills | Status: DC

## 2019-08-20 ENCOUNTER — Encounter: Payer: Self-pay | Admitting: Psychiatry

## 2019-08-20 ENCOUNTER — Telehealth: Payer: Self-pay | Admitting: Psychiatry

## 2019-08-20 ENCOUNTER — Ambulatory Visit (INDEPENDENT_AMBULATORY_CARE_PROVIDER_SITE_OTHER): Payer: Medicare Other | Admitting: Psychiatry

## 2019-08-20 ENCOUNTER — Other Ambulatory Visit: Payer: Self-pay

## 2019-08-20 VITALS — BP 124/73 | HR 78

## 2019-08-20 DIAGNOSIS — F331 Major depressive disorder, recurrent, moderate: Secondary | ICD-10-CM

## 2019-08-20 DIAGNOSIS — F900 Attention-deficit hyperactivity disorder, predominantly inattentive type: Secondary | ICD-10-CM | POA: Diagnosis not present

## 2019-08-20 DIAGNOSIS — F332 Major depressive disorder, recurrent severe without psychotic features: Secondary | ICD-10-CM | POA: Diagnosis not present

## 2019-08-20 MED ORDER — METHYLPHENIDATE HCL 20 MG PO TABS: 20.0000 mg | ORAL_TABLET | Freq: Three times a day (TID) | ORAL | 0 refills | Status: DC

## 2019-08-20 MED ORDER — LAMOTRIGINE 25 MG PO TABS: ORAL_TABLET | ORAL | 1 refills | Status: DC

## 2019-08-20 NOTE — Telephone Encounter (Signed)
Katrina Gamble is confused on how to start and decrease medicines as changed today.  Please the review instructions with her.

## 2019-08-20 NOTE — Progress Notes (Signed)
SOO BOIVIN [id number] 03/26/1941 78 y.o.     Subjective:   Patient ID:  Katrina Gamble is a 78 y.o. (DOB 1941-07-05) female.  Chief Complaint:  Chief Complaint  Patient presents with  . Follow-up    Medication Management  . Depression    Medication Management  . Anxiety    Medication Management  . ADD    Medication Management  . Other    OCD    Depression        Associated symptoms include fatigue.  Associated symptoms include no decreased concentration and no suicidal ideas.  Past medical history includes anxiety.   Medication Refill Associated symptoms include fatigue. Pertinent negatives include no weakness.  Anxiety Patient reports no confusion, decreased concentration, nervous/anxious behavior or suicidal ideas.     Katrina Gamble presents today for follow-up of ADD and mood.  When t seen May 15, 2019.  She continued to have cycles of depression without clear precipitant.  It was suggested that she increase lithium to 450 mg daily check a lithium level.  Last seen July 15, 2019.  She was having more depression.  The following changes were made. Rec increase lithium to 600 mg daily for a week and check blood level. If this fails to help or is intolerable then recommend lamotrigine trial as noted. She continued Ritalin 20 mg 3 times daily per usual  No lithium level nor BMP were received.  Not great.  Actually felt worse perhaps seasonal in part.  Novemember is bad month for depression.  Poor self care with hygiene and staying at home.  A little shakey.  Enjoys Solitaire on computer starting a month ago and rakes leaves.  Doesn't finish things well sometimes.    H died 4 years ago and seems grief is more intense.  Has been more consistently depressed since the last appointment.  Isolation and the holidays are making it worse.  Generally low energy and motivation.  Very sad.  Sometimes tearful.  No suicidal thoughts.  No benefit from  increase lithium and has more tremor.  She is afraid of trying new medications because of a history of adverse side effects with antidepressants.  She also feels somewhat hopeless about anything helping.   Consistent with meds.  Youngest D relapse breast CA.  Stressful.  New great grand D [place].  Psych med history: Trintellix caused anxiety,  Abilify no response, history of Dexedrine during pregnancy which caused weight gain, Mellaril, Nardil, Prozac with side effects, try cyclic antidepressants,  Wellbutrin which caused tremors, imipramine, Pristiq 100, Vyvanse, Cerefolin NAC, Viibryd, lithium, Ritalin  Review of Systems:  Review of Systems  Constitutional: Positive for fatigue.  Neurological: Positive for tremors. Negative for weakness.       Tremor worse at lithium 600 mg daily versus lithium 450 mg daily  Psychiatric/Behavioral: Positive for depression. Negative for agitation, behavioral problems, confusion, decreased concentration, dysphoric mood, hallucinations, self-injury, sleep disturbance and suicidal ideas. The patient is not nervous/anxious and is not hyperactive.     Medications: I have reviewed the patient's current medications.  Current Outpatient Medications  Medication Sig Dispense Refill  . levothyroxine (SYNTHROID, LEVOTHROID) 25 MCG tablet Take 25 mcg by mouth daily before breakfast.    . lithium carbonate 300 MG capsule Take 2 capsules (600 mg total) by mouth daily. 180 capsule 0  . methylphenidate (RITALIN) 20 MG tablet Take 1 tablet (20 mg total) by mouth 3 (three) times daily with meals. 90 tablet 0  . [  START ON 09/17/2019] methylphenidate (RITALIN) 20 MG tablet Take 1 tablet (20 mg total) by mouth 3 (three) times daily with meals. 90 tablet 0  . lamoTRIgine (LAMICTAL) 25 MG tablet 1 for 2 weeks, then 2 for 2 weeks, then 4 daily. 120 tablet 1   No current facility-administered medications for this visit.    Medication Side Effects: tremor is affecting handwriting  and computer worse with 600 mg lithium..  Numbness in toes.  Hands and feet always cold.  Allergies:  Allergies  Allergen Reactions  . Codeine Other (See Comments)    Abdominal pain   . Latex Hives  . Septra [Sulfamethoxazole-Trimethoprim] Rash    Past Medical History:  Diagnosis Date  . Depression   . Osteopenia 11/2017   T score -1.1 FRAX 24% / 14%  . Tremor, essential   . Vitamin D deficiency 12/2017    Family History  Problem Relation Age of Onset  . Hypertension Mother   . Thyroid disease Mother   . Breast cancer Mother 28  . Breast cancer Daughter 3  . Thyroid disease Daughter   . Other Father        Brain tumor  . Parkinson's disease Father   . Dementia Father   . Cancer Daughter        Melanoma  . Heart disease Paternal Grandfather     Social History   Socioeconomic History  . Marital status: Widowed    Spouse name: Not on file  . Number of children: Not on file  . Years of education: Not on file  . Highest education level: Not on file  Occupational History  . Not on file  Tobacco Use  . Smoking status: Former Smoker    Packs/day: 0.50    Years: 30.00    Pack years: 15.00    Types: Cigarettes    Quit date: 2012    Years since quitting: 8.9  . Smokeless tobacco: Never Used  Substance and Sexual Activity  . Alcohol use: Yes    Alcohol/week: 0.0 standard drinks    Comment: Occas.  . Drug use: No  . Sexual activity: Never    Birth control/protection: Post-menopausal    Comment: 1st intercourse 78 yo-Fewer than 5 partners  Other Topics Concern  . Not on file  Social History Narrative  . Not on file   Social Determinants of Health   Financial Resource Strain:   . Difficulty of Paying Living Expenses: Not on file  Food Insecurity:   . Worried About Charity fundraiser in the Last Year: Not on file  . Ran Out of Food in the Last Year: Not on file  Transportation Needs:   . Lack of Transportation (Medical): Not on file  . Lack of  Transportation (Non-Medical): Not on file  Physical Activity:   . Days of Exercise per Week: Not on file  . Minutes of Exercise per Session: Not on file  Stress:   . Feeling of Stress : Not on file  Social Connections:   . Frequency of Communication with Friends and Family: Not on file  . Frequency of Social Gatherings with Friends and Family: Not on file  . Attends Religious Services: Not on file  . Active Member of Clubs or Organizations: Not on file  . Attends Archivist Meetings: Not on file  . Marital Status: Not on file  Intimate Partner Violence:   . Fear of Current or Ex-Partner: Not on file  . Emotionally Abused: Not  on file  . Physically Abused: Not on file  . Sexually Abused: Not on file    Past Medical History, Surgical history, Social history, and Family history were reviewed and updated as appropriate.   Please see review of systems for further details on the patient's review from today.   Objective:   Physical Exam:  BP 124/73   Pulse 78   Physical Exam Constitutional:      General: She is not in acute distress.    Appearance: She is well-developed and normal weight.  Musculoskeletal:        General: No deformity.  Neurological:     Mental Status: She is alert and oriented to person, place, and time.     Cranial Nerves: No dysarthria.     Motor: Tremor present.     Coordination: Coordination normal.  Psychiatric:        Attention and Perception: Attention and perception normal. She does not perceive auditory or visual hallucinations.        Mood and Affect: Mood is depressed. Mood is not anxious. Affect is tearful. Affect is not labile, blunt, angry or inappropriate.        Speech: Speech normal.        Behavior: Behavior normal. Behavior is cooperative.        Thought Content: Thought content normal. Thought content is not paranoid or delusional. Thought content does not include homicidal or suicidal ideation. Thought content does not include  homicidal or suicidal plan.        Cognition and Memory: Cognition and memory normal.        Judgment: Judgment normal.     Comments: Good insight Depression worse.     Lab Review:  No results found for: NA, K, CL, CO2, GLUCOSE, BUN, CREATININE, CALCIUM, PROT, ALBUMIN, AST, ALT, ALKPHOS, BILITOT, GFRNONAA, GFRAA  No results found for: WBC, RBC, HGB, HCT, PLT, MCV, MCH, MCHC, RDW, LYMPHSABS, MONOABS, EOSABS, BASOSABS  No results found for: POCLITH, LITHIUM   No results found for: PHENYTOIN, PHENOBARB, VALPROATE, CBMZ   .res Assessment: Plan:    Severe episode of recurrent major depressive disorder, without psychotic features ([place]) - Plan: lamoTRIgine (LAMICTAL) 25 MG tablet  Major depressive disorder, recurrent episode, moderate ([facility]) - Plan: methylphenidate (RITALIN) 20 MG tablet, methylphenidate (RITALIN) 20 MG tablet  Attention deficit hyperactivity disorder (ADHD), predominantly inattentive type - Plan: methylphenidate (RITALIN) 20 MG tablet, methylphenidate (RITALIN) 20 MG tablet   .Greater than 50% of 30-minute face to face time with patient was spent on counseling and coordination of care. We discussed Marshae has a long history over decades of cycling major depression and treatment resistance to meds noted above of nearly every antidepressant category.  Her best response so far has been from low-dose lithium with a stimulant. .  She has noted more mood cycling lately but are worse than last visit with more prolonged periods of depression.  N.  They do not follow any pattern and do not appear to have any precipitant.  She wanted some sort of med change at the last visit.  We elected to increase lithium to 600 mg daily and check a blood level.  She did not check the blood level.  The increase in lithium did not help her and her made her tremor worse.  While she does not identify herself is bipolar because her upswings seem only normal to her.  The cycling nature of her depression is  more consistent with a bipolar type depression  than it is with major depression which does not typically occur with intermittent periods of several days of feeling euthymic and then back into depression again which is her pattern.  No lithium level was received.    History of essential tremor prior to lithium.  Inadequate trial propranolol. Option propranolol.  She doesn't want to add more meds.  Takes little med.  She understands the increase in the lithium may worsen her essential tremor.  She says lithium level was 0.6 at PCP on 450 mg daily. Counseled patient regarding potential benefits, risks, and side effects of lithium to include potential risk of lithium affecting thyroid and renal function.  Discussed need for periodic lab monitoring to determine drug level and to assess for potential adverse effects.  Counseled patient regarding signs and symptoms of lithium toxicity and advised that they notify office immediately or seek urgent medical attention if experiencing these signs and symptoms.  Patient advised to contact office with any questions or concerns.  Discussed drug interaction issues that could elevate lithium levels.  Specifically her lithium level September 6 on this dosage of lithium was 0.3.  Her urinalysis was normal she had a normal CBC, comprehensive metabolic panel was normal including creatinine 0.83 calcium 9.9 and TSH was normal at 2.83.  Lithium level in April 2019 was 0.2 at this dosage.  We will not increase the lithium because she is currently having tremor at the low dose.  Reduce lithium to 450 mg daily DT NR at 600 daily  recommend lamotrigine trial as noted. Strong persuasion needed bc she is so hopeless about anything helping and hx of SE to regular antidepressants.  In reality it typically has less side effect than your average antidepressant in your average person. Counseled patient regarding potential benefits, risks, and side effects of Lamictal to include  potential risk of Stevens-Johnson syndrome. Advised patient to stop taking Lamictal and contact office immediately if rash develops and to seek urgent medical attention if rash is severe and/or spreading quickly. She agrees to the usual lamotrigine dosing starting at 25 mg and increasing every 2 weeks to a total of 100 mg in 4 weeks.  Discussed potential benefits, risks, and side effects of stimulants with patient to include increased heart rate, palpitations, insomnia, increased anxiety, increased irritability, or decreased appetite.  Instructed patient to contact office if experiencing any significant tolerability issues.  Restart Vitamin D. Disc reasons for doing so for mental and physical health in detail.   She is generally fearful of meds  And reluctance.  FU 2 months Mos or sooner prn  Lynder Parents, MD, DFAPA '   Please see After Visit Summary for patient specific instructions.  Future Appointments  Date Time Provider [place]  10/15/2019 11:00 AM Cottle, Billey Co., MD [facility] None    No orders of the defined types were placed in this encounter.     -------------------------------

## 2019-08-20 NOTE — Telephone Encounter (Signed)
Rtc to patient and clarified her instructions. Verbalized understanding of medication changes per office note today.

## 2019-09-09 ENCOUNTER — Telehealth: Payer: Self-pay | Admitting: Psychiatry

## 2019-09-09 NOTE — Telephone Encounter (Signed)
I understand that she is having some worsening of her tremor.  Lamotrigine does not typically cause tremor.  The low dosages not likely to help her.  I would suggest she increase the lamotrigine as planned.  If it does cause worsening tremor later we can always reduce it.  Lamotrigine is definitely not associated with any permanent type of tremors.

## 2019-09-09 NOTE — Telephone Encounter (Signed)
Pt. Made aware. She stated she did lower her dose of lithium by 150 mg about 4 week ago. She is just taking 300 mg now. She thought that might help with the trebling. Do you want her to continue this dose or go back up to the 450 Mg of Lithium?

## 2019-09-09 NOTE — Telephone Encounter (Signed)
Make one change at a time.  Wait 2 weeks before the next med change.

## 2019-09-09 NOTE — Telephone Encounter (Signed)
Pt called to report she has been on the Lamictal now for two weeks. After 1 week on the Lamictal she noticed a tremor. She did decrease the dose of another medication she takes to see if that was the problem, but she still has tremor. She is not sure about increasing the Lamictal dose now if it is causing tremor. Please advise.

## 2019-09-10 NOTE — Telephone Encounter (Signed)
Pt. Made aware.

## 2019-09-11 HISTORY — PX: CATARACT EXTRACTION, BILATERAL: SHX1313

## 2019-09-22 ENCOUNTER — Ambulatory Visit: Payer: Medicare Other | Attending: Internal Medicine

## 2019-09-22 DIAGNOSIS — Z23 Encounter for immunization: Secondary | ICD-10-CM

## 2019-09-22 NOTE — Progress Notes (Signed)
   Covid-19 Vaccination Clinic  Name:  Katrina Gamble    MRN: [id number] DOB: 1940-12-26  09/22/2019  Katrina Gamble was observed post Covid-19 immunization for 15 minutes without incidence. She was provided with Vaccine Information Sheet and instruction to access the V-Safe system.   Katrina Gamble was instructed to call 911 with any severe reactions post vaccine: Marland Kitchen Difficulty breathing  . Swelling of your face and throat  . A fast heartbeat  . A bad rash all over your body  . Dizziness and weakness    Immunizations Administered    Name Date Dose VIS Date Route   Pfizer COVID-19 Vaccine 09/22/2019  9:22 AM 0.3 mL 08/21/2019 Intramuscular   Manufacturer: [employer], [employer]   Lot: [id number]   [place]: [id number]

## 2019-10-05 DIAGNOSIS — H2511 Age-related nuclear cataract, right eye: Secondary | ICD-10-CM | POA: Diagnosis not present

## 2019-10-06 DIAGNOSIS — H2512 Age-related nuclear cataract, left eye: Secondary | ICD-10-CM | POA: Diagnosis not present

## 2019-10-12 ENCOUNTER — Ambulatory Visit: Payer: Medicare Other | Attending: Internal Medicine

## 2019-10-12 DIAGNOSIS — Z23 Encounter for immunization: Secondary | ICD-10-CM | POA: Insufficient documentation

## 2019-10-12 NOTE — Progress Notes (Signed)
   Covid-19 Vaccination Clinic  Name:  Katrina Gamble    MRN: [id number] DOB: 16-Jan-1941  10/12/2019  Katrina Gamble was observed post Covid-19 immunization for 15 minutes without incidence. She was provided with Vaccine Information Sheet and instruction to access the V-Safe system.   Katrina Gamble was instructed to call 911 with any severe reactions post vaccine: Marland Kitchen Difficulty breathing  . Swelling of your face and throat  . A fast heartbeat  . A bad rash all over your body  . Dizziness and weakness    Immunizations Administered    Name Date Dose VIS Date Route   Pfizer COVID-19 Vaccine 10/12/2019  9:45 AM 0.3 mL 08/21/2019 Intramuscular   Manufacturer: [place]   Lot: [id number]   [place]: [id number]

## 2019-10-15 ENCOUNTER — Encounter: Payer: Self-pay | Admitting: Psychiatry

## 2019-10-15 ENCOUNTER — Other Ambulatory Visit: Payer: Self-pay

## 2019-10-15 ENCOUNTER — Ambulatory Visit (INDEPENDENT_AMBULATORY_CARE_PROVIDER_SITE_OTHER): Payer: Medicare Other | Admitting: Psychiatry

## 2019-10-15 VITALS — BP 136/74 | HR 76

## 2019-10-15 DIAGNOSIS — F332 Major depressive disorder, recurrent severe without psychotic features: Secondary | ICD-10-CM | POA: Diagnosis not present

## 2019-10-15 DIAGNOSIS — F411 Generalized anxiety disorder: Secondary | ICD-10-CM

## 2019-10-15 DIAGNOSIS — F331 Major depressive disorder, recurrent, moderate: Secondary | ICD-10-CM | POA: Diagnosis not present

## 2019-10-15 DIAGNOSIS — F900 Attention-deficit hyperactivity disorder, predominantly inattentive type: Secondary | ICD-10-CM | POA: Diagnosis not present

## 2019-10-15 DIAGNOSIS — F3341 Major depressive disorder, recurrent, in partial remission: Secondary | ICD-10-CM

## 2019-10-15 DIAGNOSIS — G250 Essential tremor: Secondary | ICD-10-CM | POA: Diagnosis not present

## 2019-10-15 DIAGNOSIS — G2401 Drug induced subacute dyskinesia: Secondary | ICD-10-CM

## 2019-10-15 MED ORDER — METHYLPHENIDATE HCL 20 MG PO TABS: 20.0000 mg | ORAL_TABLET | Freq: Three times a day (TID) | ORAL | 0 refills | Status: DC

## 2019-10-15 MED ORDER — LAMOTRIGINE 100 MG PO TABS: ORAL_TABLET | ORAL | 0 refills | Status: DC

## 2019-10-15 MED ORDER — PROPRANOLOL HCL 10 MG PO TABS: 10.0000 mg | ORAL_TABLET | Freq: Two times a day (BID) | ORAL | 0 refills | Status: DC

## 2019-10-15 NOTE — Progress Notes (Signed)
Katrina Gamble [id number] 08-Dec-1940 79 y.o.     Subjective:   Patient ID:  Katrina Gamble is a 79 y.o. (DOB 03/12/1941) female.  Chief Complaint:  Chief Complaint  Patient presents with  . Follow-up     Medication Management  . Depression     Medication Management  . Other    Tremors increasing    Depression        Associated symptoms include fatigue.  Associated symptoms include no decreased concentration and no suicidal ideas.  Past medical history includes anxiety.   Anxiety Patient reports no confusion, decreased concentration, nervous/anxious behavior or suicidal ideas.    Medication Refill Associated symptoms include fatigue. Pertinent negatives include no weakness.   Katrina Gamble presents today for follow-up of ADD and mood.  When t seen May 15, 2019.  She continued to have cycles of depression without clear precipitant.  It was suggested that she increase lithium to 450 mg daily check a lithium level.  seen July 15, 2019.  She was having more depression.  The following changes were made. Rec increase lithium to 600 mg daily for a week and check blood level. If this fails to help or is intolerable then recommend lamotrigine trial as noted. She continued Ritalin 20 mg 3 times daily per usual  No lithium level nor BMP were received.  Last seen August 20 2019.  She was still dealing with depression and had not seen any improvement in depression at 600 mg versus 450 mg daily.  She did not get a blood level. Reduce lithium to 450 mg daily DT NR at 600 daily  recommend lamotrigine trial as noted.  She called back December 30 stating she had been on lamotrigine for 2 weeks and was noticing a tremor and wondered if it was related.  BC of tremor she went down again from 450 mg in lithium and had reduced it to 300 mg instead of 450 mg.  CO  Worsening tremor intentional affecting mouse use about December 30.  Wonders about essential tremor vs TD.    Thyroid checked a couple months ago and dose was not changed.  Tremor in head interfered with dental procedures some.  Alcohol does not help tremor and  Never has.   Essential tremor dx decades ago.  Mood is better with lamotrigine 100 mg daily.  Mood more level.    Patient reports stable mood and denies depressed or irritable moods.  Patient denies any recent difficulty with anxiety.  Patient denies difficulty with sleep initiation or maintenance. Denies appetite disturbance.  Patient reports that energy and motivation have been good.  Patient denies any difficulty with concentration.  Patient denies any suicidal ideation.   Consistent with meds.  Holter monitor occ SVT and rare VT but not requiring treatment  H died 4 years ago and seems grief is more intense. Youngest D relapse breast CA.  Stressful.  New great grand D [place].  Psych med history: Trintellix caused anxiety,  Abilify no response,  history of Dexedrine during pregnancy which caused weight gain,  Mellaril,  Nardil, Prozac with side effects, try cyclic antidepressants,  Wellbutrin which caused tremors, imipramine, Pristiq 100,  Vyvanse, Cerefolin NAC, Viibryd, lithium, Ritalin Propranolol remotely with little effect  Review of Systems:  Review of Systems  Constitutional: Positive for fatigue.  Neurological: Positive for tremors. Negative for weakness.       Tremor worse at lithium 600 mg daily versus lithium 450 mg daily  Psychiatric/Behavioral: Positive  for depression. Negative for agitation, behavioral problems, confusion, decreased concentration, dysphoric mood, hallucinations, self-injury, sleep disturbance and suicidal ideas. The patient is not nervous/anxious and is not hyperactive.     Medications: I have reviewed the patient's current medications.  Current Outpatient Medications  Medication Sig Dispense Refill  . lamoTRIgine (LAMICTAL) 100 MG tablet 1 for 2 weeks, then 2 for 2 weeks, then 4 daily. 90 tablet 0   . levothyroxine (SYNTHROID, LEVOTHROID) 25 MCG tablet Take 25 mcg by mouth daily before breakfast.    . lithium carbonate 300 MG capsule Take 2 capsules (600 mg total) by mouth daily. (Patient taking differently: Take 300 mg by mouth daily. ) 180 capsule 0  . methylphenidate (RITALIN) 20 MG tablet Take 1 tablet (20 mg total) by mouth 3 (three) times daily with meals. 90 tablet 0  . [START ON 11/12/2019] methylphenidate (RITALIN) 20 MG tablet Take 1 tablet (20 mg total) by mouth 3 (three) times daily with meals. 90 tablet 0  . [START ON 12/10/2019] methylphenidate (RITALIN) 20 MG tablet Take 1 tablet (20 mg total) by mouth 3 (three) times daily with meals. 90 tablet 0  . propranolol (INDERAL) 10 MG tablet Take 1-4 tablets (10-40 mg total) by mouth 2 (two) times daily. 100 tablet 0   No current facility-administered medications for this visit.    Medication Side Effects: tremor is affecting handwriting and computer worse with 600 mg lithium..  Numbness in toes.  Hands and feet always cold.  Allergies:  Allergies  Allergen Reactions  . Codeine Other (See Comments)    Abdominal pain   . Latex Hives  . Septra [Sulfamethoxazole-Trimethoprim] Rash    Past Medical History:  Diagnosis Date  . Depression   . Osteopenia 11/2017   T score -1.1 FRAX 24% / 14%  . Tremor, essential   . Vitamin D deficiency 12/2017    Family History  Problem Relation Age of Onset  . Hypertension Mother   . Thyroid disease Mother   . Breast cancer Mother 49  . Breast cancer Daughter 63  . Thyroid disease Daughter   . Other Father        Brain tumor  . Parkinson's disease Father   . Dementia Father   . Cancer Daughter        Melanoma  . Heart disease Paternal Grandfather     Social History   Socioeconomic History  . Marital status: Widowed    Spouse name: Not on file  . Number of children: Not on file  . Years of education: Not on file  . Highest education level: Not on file  Occupational History   . Not on file  Tobacco Use  . Smoking status: Former Smoker    Packs/day: 0.50    Years: 30.00    Pack years: 15.00    Types: Cigarettes    Quit date: 2012    Years since quitting: 9.1  . Smokeless tobacco: Never Used  Substance and Sexual Activity  . Alcohol use: Yes    Alcohol/week: 0.0 standard drinks    Comment: Occas.  . Drug use: No  . Sexual activity: Never    Birth control/protection: Post-menopausal    Comment: 1st intercourse 79 yo-Fewer than 5 partners  Other Topics Concern  . Not on file  Social History Narrative  . Not on file   Social Determinants of Health   Financial Resource Strain:   . Difficulty of Paying Living Expenses: Not on file  Food Insecurity:   .  Worried About Charity fundraiser in the Last Year: Not on file  . Ran Out of Food in the Last Year: Not on file  Transportation Needs:   . Lack of Transportation (Medical): Not on file  . Lack of Transportation (Non-Medical): Not on file  Physical Activity:   . Days of Exercise per Week: Not on file  . Minutes of Exercise per Session: Not on file  Stress:   . Feeling of Stress : Not on file  Social Connections:   . Frequency of Communication with Friends and Family: Not on file  . Frequency of Social Gatherings with Friends and Family: Not on file  . Attends Religious Services: Not on file  . Active Member of Clubs or Organizations: Not on file  . Attends Archivist Meetings: Not on file  . Marital Status: Not on file  Intimate Partner Violence:   . Fear of Current or Ex-Partner: Not on file  . Emotionally Abused: Not on file  . Physically Abused: Not on file  . Sexually Abused: Not on file    Past Medical History, Surgical history, Social history, and Family history were reviewed and updated as appropriate.   Please see review of systems for further details on the patient's review from today.   Objective:   Physical Exam:  BP 136/74   Pulse 76   Physical  Exam Constitutional:      General: She is not in acute distress.    Appearance: She is well-developed and normal weight.  Musculoskeletal:        General: No deformity.  Neurological:     Mental Status: She is alert and oriented to person, place, and time.     Cranial Nerves: No dysarthria.     Motor: Tremor present.     Coordination: Coordination normal.  Psychiatric:        Attention and Perception: Attention and perception normal. She does not perceive auditory or visual hallucinations.        Mood and Affect: Mood is not anxious or depressed. Affect is not labile, blunt, angry, tearful or inappropriate.        Speech: Speech normal.        Behavior: Behavior normal. Behavior is cooperative.        Thought Content: Thought content normal. Thought content is not paranoid or delusional. Thought content does not include homicidal or suicidal ideation. Thought content does not include homicidal or suicidal plan.        Cognition and Memory: Cognition and memory normal.        Judgment: Judgment normal.     Comments: Good insight Depression worse.    Today objectively the patient does not show significant mouth movements but she displayed video since her last visit in which typical mouth movements cheek movements associated with tardive dyskinesia was evident.  Her tremor is rhythmic and is not supportive of a tardive dyskinesia type tremor.  Lab Review:  No results found for: NA, K, CL, CO2, GLUCOSE, BUN, CREATININE, CALCIUM, PROT, ALBUMIN, AST, ALT, ALKPHOS, BILITOT, GFRNONAA, GFRAA  No results found for: WBC, RBC, HGB, HCT, PLT, MCV, MCH, MCHC, RDW, LYMPHSABS, MONOABS, EOSABS, BASOSABS  No results found for: POCLITH, LITHIUM   No results found for: PHENYTOIN, PHENOBARB, VALPROATE, CBMZ   She says lithium level was 0.6 at PCP on 450 mg daily.   Specifically her lithium level May 17, 2019 on this dosage of lithium was 0.3.  Her urinalysis was normal she  had a normal CBC,  comprehensive metabolic panel was normal including creatinine 0.83 calcium 9.9 and TSH was normal at 2.83.  Lithium level in April 2019 was 0.2 at this dosage.  We will not increase the lithium because she is currently having tremor at the low dose.  .res Assessment: Plan:    Tremor, essential - Plan: propranolol (INDERAL) 10 MG tablet  Depression, major, recurrent, in partial remission (HCC)  Attention deficit hyperactivity disorder (ADHD), predominantly inattentive type - Plan: methylphenidate (RITALIN) 20 MG tablet, methylphenidate (RITALIN) 20 MG tablet, methylphenidate (RITALIN) 20 MG tablet  Generalized anxiety disorder  Tardive dyskinesia  Severe episode of recurrent major depressive disorder, without psychotic features ([place]) - Plan: lamoTRIgine (LAMICTAL) 100 MG tablet  Major depressive disorder, recurrent episode, moderate (HCC) - Plan: methylphenidate (RITALIN) 20 MG tablet, methylphenidate (RITALIN) 20 MG tablet   .Greater than 50% of 30-minute face to face time with patient was spent on counseling and coordination of care. We discussed Elvenia has a long history over decades of cycling major depression and treatment resistance to meds noted above of nearly every antidepressant category.  Her best response so far has been from low-dose lithium with a stimulant. .  She had noted more mood cycling l at December 2020 visit with more prolonged periods of depression.  N.  They do not follow any pattern and do not appear to have any precipitant.   While she does not identify herself is bipolar because her upswings seem only normal to her.  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  .  We tried an increase lithium to 600 mg daily and check a blood level.  She did  not check the blood level.  The increase in lithium did not help her mood and her made her tremor worse.  At visit December 2028 we added lamotrigine and increase to 100 mg daily.  That has been effective at easing the depression and helping her mood to feel much more normal.  She is not had any side effects.  She has had a worsening of tremor since here.  Today objectively the patient does not show significant mouth movements but she displayed video since her last visit in which typical mouth movements cheek movements associated with tardive dyskinesia was evident.  Her tremor is rhythmic and is not supportive of a tardive dyskinesia type tremor.  History of essential tremor prior to lithium.  Inadequate trial propranolol. Option propranolol.    Counseled patient regarding potential benefits, risks, and side effects of lithium to include potential risk of lithium affecting thyroid and renal function.  Discussed need for periodic lab monitoring to determine drug level and to assess for potential adverse effects.  Counseled patient regarding signs and symptoms of lithium toxicity and advised that they notify office immediately or seek urgent medical attention if experiencing these signs and symptoms.  Patient advised to contact office with any questions or concerns.  Discussed drug interaction issues that could elevate lithium levels.  Continue lamotrigine 100 mg daily as it was helpful. Continue Ritalin as prescribed. Continue lithium 300 mg nightly.  So far her mood is been stable with a lower dose  Discussed potential benefits, risks, and side effects of stimulants with patient to include increased heart rate, palpitations, insomnia, increased  anxiety, increased irritability, or decreased appetite.  Instructed patient to contact office if experiencing any significant tolerability issues.  Continue t Vitamin D. Disc reasons for doing so for mental and physical health in detail.   FU 2 months Mos  or sooner prn  Lynder Parents, MD, DFAPA '   Please see After Visit Summary for patient specific instructions.  Future Appointments  Date Time Provider [place]  11/13/2019 10:00 AM Joseph Pierini, MD [facility]  01/12/2020 11:15 AM Cottle, Billey Co., MD [facility] None    No orders of the defined types were placed in this encounter.     -------------------------------

## 2019-10-26 DIAGNOSIS — H2512 Age-related nuclear cataract, left eye: Secondary | ICD-10-CM | POA: Diagnosis not present

## 2019-11-09 ENCOUNTER — Other Ambulatory Visit: Payer: Self-pay | Admitting: Psychiatry

## 2019-11-09 DIAGNOSIS — F332 Major depressive disorder, recurrent severe without psychotic features: Secondary | ICD-10-CM

## 2019-11-12 ENCOUNTER — Other Ambulatory Visit: Payer: Self-pay

## 2019-11-13 ENCOUNTER — Encounter: Payer: Self-pay | Admitting: Obstetrics and Gynecology

## 2019-11-13 ENCOUNTER — Ambulatory Visit (INDEPENDENT_AMBULATORY_CARE_PROVIDER_SITE_OTHER): Payer: Medicare Other | Admitting: Obstetrics and Gynecology

## 2019-11-13 VITALS — BP 118/76 | Ht 66.0 in | Wt 144.0 lb

## 2019-11-13 DIAGNOSIS — R1032 Left lower quadrant pain: Secondary | ICD-10-CM | POA: Diagnosis not present

## 2019-11-13 DIAGNOSIS — Z01419 Encounter for gynecological examination (general) (routine) without abnormal findings: Secondary | ICD-10-CM

## 2019-11-13 DIAGNOSIS — M8580 Other specified disorders of bone density and structure, unspecified site: Secondary | ICD-10-CM | POA: Diagnosis not present

## 2019-11-13 DIAGNOSIS — Z9189 Other specified personal risk factors, not elsewhere classified: Secondary | ICD-10-CM

## 2019-11-13 DIAGNOSIS — N959 Unspecified menopausal and perimenopausal disorder: Secondary | ICD-10-CM

## 2019-11-13 DIAGNOSIS — N950 Postmenopausal bleeding: Secondary | ICD-10-CM | POA: Diagnosis not present

## 2019-11-13 DIAGNOSIS — Z8742 Personal history of other diseases of the female genital tract: Secondary | ICD-10-CM | POA: Diagnosis not present

## 2019-11-13 NOTE — Progress Notes (Signed)
HAWRAA KOBZA 30-Jul-1941 [id number]  SUBJECTIVE:  79 y.o. G3P3003 female for annual routine gynecologic exam.  Notes intermittent crampy, 'nawing' feeling in LLQ for the past 1-2 weeks.  Also in the last year she has experienced 2 single day episodes of mild uterine cramping and light spotting which resolved right away. No changes in bowel movement or dietary change.   Current Outpatient Medications  Medication Sig Dispense Refill  . lamoTRIgine (LAMICTAL) 100 MG tablet 1 for 2 weeks, then 2 for 2 weeks, then 4 daily. 90 tablet 0  . levothyroxine (SYNTHROID, LEVOTHROID) 25 MCG tablet Take 25 mcg by mouth daily before breakfast.    . lithium carbonate 300 MG capsule Take 2 capsules (600 mg total) by mouth daily. (Patient taking differently: Take 300 mg by mouth daily. ) 180 capsule 0  . methylphenidate (RITALIN) 20 MG tablet Take 1 tablet (20 mg total) by mouth 3 (three) times daily with meals. 90 tablet 0  . propranolol (INDERAL) 10 MG tablet Take 1-4 tablets (10-40 mg total) by mouth 2 (two) times daily. 100 tablet 0   No current facility-administered medications for this visit.   Allergies: Codeine, Latex, and Septra [sulfamethoxazole-trimethoprim]  No LMP recorded. Patient is postmenopausal.  Past medical history,surgical history, problem list, medications, allergies, family history and social history were all reviewed and documented as reviewed in the EPIC chart.  ROS:  Feeling well. No dyspnea or chest pain on exertion.  No abdominal pain, change in bowel habits, black or bloody stools.  No urinary tract symptoms. GYN ROS: transient abnormal bleeding, some LLQ pelvic pain, no discharge, no breast pain or new or enlarging lumps on self exam. No neurological complaints.    OBJECTIVE:  BP 118/76   Ht 5\' 6"  (1.676 m)   Wt 144 lb (65.3 kg)   BMI 23.24 kg/m  The patient appears well, alert, oriented x 3, in no distress. ENT normal.  Neck supple. No cervical or  supraclavicular adenopathy or thyromegaly.  Lungs are clear, good air entry, no wheezes, rhonchi or rales. S1 and S2 normal, no murmurs, regular rate and rhythm.  Abdomen soft without tenderness, guarding, mass or organomegaly.  Neurological is normal, no focal findings.  BREAST EXAM: breasts appear normal, no suspicious masses, no skin or nipple changes or axillary nodes  PELVIC EXAM: VULVA: normal appearing vulva with no masses, tenderness or lesions, atrophic changes noted, VAGINA: normal appearing vagina with normal color and discharge, no lesions, CERVIX: normal appearing cervix without discharge or lesions, UTERUS: uterus is normal size, shape, consistency and nontender, ADNEXA: normal adnexa in size, nontender and no masses, RECTAL: normal rectal, no masses  Chaperone: [name] present during the examination  ASSESSMENT:  79 y.o. G3P3003 here for annual gynecologic exam, noting LLQ pain and episodic postmenopausal bleeding  PLAN:    1.  Post menopause.  Two spotting episodes last year and now 1 week history of left lower quadrant pain.  Examination is normal today.  Not tender on exam.  I recommended checking a pelvic ultrasound she will schedule this to evaluate for ovarian cysts and any intrauterine pathology. 2. Pap smear 2010.  No history of abnormal Pap smears.  Comfortable not screening based on her history and age according to the current guidelines. 3. Mammogram 07/2019.  Normal breast exam today.  She is reminded to schedule an annual mammogram when due. 4. Colonoscopy 2010.  Recommended that she follow up at the recommended interval.   5. DEXA 11/2017 T  score -1.1 FRAX 24% / 14%.  Recommend that she repeat the DEXA scan this year, so she will plan to get this scheduled.   6. Health maintenance.  No labs today as she normally has these completed with her primary care provider.   Return annually or sooner, prn.  Joseph Pierini MD  11/13/19

## 2019-11-13 NOTE — Patient Instructions (Signed)
We will plan to repeat the DEXA/bone density scan this year, please schedule.  Continue weight bearing exercise, and vitamin D/calcium intake. Please schedule a pelvic ultrasound to evaluate your left sided pain and spotting episodes

## 2019-11-26 DIAGNOSIS — F339 Major depressive disorder, recurrent, unspecified: Secondary | ICD-10-CM | POA: Diagnosis not present

## 2019-11-26 DIAGNOSIS — R350 Frequency of micturition: Secondary | ICD-10-CM | POA: Diagnosis not present

## 2019-11-26 DIAGNOSIS — Z0000 Encounter for general adult medical examination without abnormal findings: Secondary | ICD-10-CM | POA: Diagnosis not present

## 2019-11-26 DIAGNOSIS — M8580 Other specified disorders of bone density and structure, unspecified site: Secondary | ICD-10-CM | POA: Diagnosis not present

## 2019-11-26 DIAGNOSIS — R300 Dysuria: Secondary | ICD-10-CM | POA: Diagnosis not present

## 2019-11-26 DIAGNOSIS — E039 Hypothyroidism, unspecified: Secondary | ICD-10-CM | POA: Diagnosis not present

## 2019-11-26 DIAGNOSIS — Z23 Encounter for immunization: Secondary | ICD-10-CM | POA: Diagnosis not present

## 2019-11-26 DIAGNOSIS — Z79899 Other long term (current) drug therapy: Secondary | ICD-10-CM | POA: Diagnosis not present

## 2019-11-26 DIAGNOSIS — Z13220 Encounter for screening for lipoid disorders: Secondary | ICD-10-CM | POA: Diagnosis not present

## 2019-11-26 DIAGNOSIS — J309 Allergic rhinitis, unspecified: Secondary | ICD-10-CM | POA: Diagnosis not present

## 2019-12-07 ENCOUNTER — Other Ambulatory Visit: Payer: Self-pay

## 2019-12-08 ENCOUNTER — Other Ambulatory Visit: Payer: Self-pay | Admitting: Obstetrics and Gynecology

## 2019-12-08 ENCOUNTER — Ambulatory Visit (INDEPENDENT_AMBULATORY_CARE_PROVIDER_SITE_OTHER): Payer: Medicare Other

## 2019-12-08 DIAGNOSIS — M8589 Other specified disorders of bone density and structure, multiple sites: Secondary | ICD-10-CM

## 2019-12-08 DIAGNOSIS — M8580 Other specified disorders of bone density and structure, unspecified site: Secondary | ICD-10-CM

## 2019-12-08 DIAGNOSIS — Z780 Asymptomatic menopausal state: Secondary | ICD-10-CM

## 2019-12-08 DIAGNOSIS — N959 Unspecified menopausal and perimenopausal disorder: Secondary | ICD-10-CM

## 2019-12-08 DIAGNOSIS — Z01419 Encounter for gynecological examination (general) (routine) without abnormal findings: Secondary | ICD-10-CM

## 2019-12-09 ENCOUNTER — Other Ambulatory Visit: Payer: Self-pay

## 2019-12-10 ENCOUNTER — Encounter: Payer: Self-pay | Admitting: Obstetrics and Gynecology

## 2019-12-10 ENCOUNTER — Other Ambulatory Visit: Payer: Self-pay | Admitting: Obstetrics and Gynecology

## 2019-12-10 ENCOUNTER — Ambulatory Visit (INDEPENDENT_AMBULATORY_CARE_PROVIDER_SITE_OTHER): Payer: Medicare Other

## 2019-12-10 ENCOUNTER — Ambulatory Visit (INDEPENDENT_AMBULATORY_CARE_PROVIDER_SITE_OTHER): Payer: Medicare Other | Admitting: Obstetrics and Gynecology

## 2019-12-10 VITALS — BP 124/78

## 2019-12-10 DIAGNOSIS — R1032 Left lower quadrant pain: Secondary | ICD-10-CM | POA: Diagnosis not present

## 2019-12-10 DIAGNOSIS — N83202 Unspecified ovarian cyst, left side: Secondary | ICD-10-CM

## 2019-12-10 DIAGNOSIS — Z8742 Personal history of other diseases of the female genital tract: Secondary | ICD-10-CM

## 2019-12-10 NOTE — Progress Notes (Addendum)
   Katrina Gamble  1941-01-16 [id number]  HPI The patient is a 79 y.o. G3P3003 who presents today for a pelvic ultrasound due to left lower quadrant pain.  She still intermittently is having this discomfort.  She says her bowel movements are regular and not causing her any discomfort but no problems with constipation.  Pelvic ultrasound Uterus 5.2 x 3.3 x 2.0 cm.  No myometrial masses. Small amount of endometrial fluid outlines thin and symmetrical walls, total endometrial thickness 1.5 mm.  Fluid collection extends the cervical canal and there are no abnormalities.   Right ovary is small and atrophic. Left ovary with 12 x 11 mm simple avascular cystic structure. No free fluid. Intestinal walls and left lower quadrant with signs of inflammation.  Assessment 79 yo G3P3 menopausal female with left lower quadrant pain, incidental finding of small left ovarian cyst and inflammatory bowel appearance in left lower quadrant, transient episode of postmenopausal bleeding  Plan Findings are discussed with the patient.  No gynecologic findings that would explain her left lower quadrant discomfort.  The tiny left ovarian cyst is simple in appearance and avascular, most likely benign.  I do recommend checking a pelvic ultrasound in 6 months to follow-up the cyst, which is ordered.  Also, the total endometrial thickness is thin, the naturally occurring endometrial and endocervical fluid outlines of thin endometrial lining with no evidence of polyp or mass in the uterus or cervix.  She does have findings suggestive of inflammation in the sigmoid colon, so I would recommend that if the pain symptoms are bothersome to her, that she follow-up with her primary care provider for further work-up and evaluation recommendations.  She should also let us know if there is any ongoing vaginal bleeding.  The patient is comfortable with this plan.   Joseph Pierini MD, FACOG 12/10/19

## 2019-12-14 ENCOUNTER — Other Ambulatory Visit: Payer: Self-pay | Admitting: Psychiatry

## 2019-12-14 DIAGNOSIS — G250 Essential tremor: Secondary | ICD-10-CM

## 2019-12-25 DIAGNOSIS — K5792 Diverticulitis of intestine, part unspecified, without perforation or abscess without bleeding: Secondary | ICD-10-CM | POA: Diagnosis not present

## 2019-12-30 ENCOUNTER — Encounter: Payer: Self-pay | Admitting: Dermatology

## 2019-12-30 ENCOUNTER — Ambulatory Visit (INDEPENDENT_AMBULATORY_CARE_PROVIDER_SITE_OTHER): Payer: Medicare Other | Admitting: Dermatology

## 2019-12-30 ENCOUNTER — Other Ambulatory Visit: Payer: Self-pay

## 2019-12-30 DIAGNOSIS — L650 Telogen effluvium: Secondary | ICD-10-CM

## 2019-12-30 DIAGNOSIS — L570 Actinic keratosis: Secondary | ICD-10-CM

## 2019-12-30 NOTE — Progress Notes (Signed)
   Follow-Up Visit   Subjective  Katrina Gamble is a 79 y.o. female who presents for the following: Biopsy (check forehead new for months, but comes and goes dose get crust  and bleed).  Growth Location: Forehead Duration: Months Quality: Associated Signs/Symptoms: Has bled Modifying Factors:  Severity:  Timing: Context: Also wishes to discuss hair loss  The following portions of the chart were reviewed this encounter and updated as appropriate:     Objective  Well appearing patient in no apparent distress; mood and affect are within normal limits.  All sun exposed areas plus back examined. DISCUSSED HAIR LOSS: FPHL VS TE.  Assessment & Plan  No definite skin cancer noted today but to return if recurs for possible biopsy.

## 2020-01-03 ENCOUNTER — Encounter: Payer: Self-pay | Admitting: Dermatology

## 2020-01-12 ENCOUNTER — Other Ambulatory Visit: Payer: Self-pay

## 2020-01-12 ENCOUNTER — Ambulatory Visit (INDEPENDENT_AMBULATORY_CARE_PROVIDER_SITE_OTHER): Payer: Medicare Other | Admitting: Psychiatry

## 2020-01-12 ENCOUNTER — Encounter: Payer: Self-pay | Admitting: Psychiatry

## 2020-01-12 DIAGNOSIS — F3341 Major depressive disorder, recurrent, in partial remission: Secondary | ICD-10-CM

## 2020-01-12 DIAGNOSIS — F411 Generalized anxiety disorder: Secondary | ICD-10-CM | POA: Diagnosis not present

## 2020-01-12 DIAGNOSIS — G2401 Drug induced subacute dyskinesia: Secondary | ICD-10-CM

## 2020-01-12 DIAGNOSIS — R7989 Other specified abnormal findings of blood chemistry: Secondary | ICD-10-CM | POA: Diagnosis not present

## 2020-01-12 DIAGNOSIS — Z79899 Other long term (current) drug therapy: Secondary | ICD-10-CM | POA: Diagnosis not present

## 2020-01-12 DIAGNOSIS — F331 Major depressive disorder, recurrent, moderate: Secondary | ICD-10-CM

## 2020-01-12 DIAGNOSIS — G250 Essential tremor: Secondary | ICD-10-CM | POA: Diagnosis not present

## 2020-01-12 DIAGNOSIS — F332 Major depressive disorder, recurrent severe without psychotic features: Secondary | ICD-10-CM

## 2020-01-12 DIAGNOSIS — F900 Attention-deficit hyperactivity disorder, predominantly inattentive type: Secondary | ICD-10-CM

## 2020-01-12 MED ORDER — METHYLPHENIDATE HCL 20 MG PO TABS: 20.0000 mg | ORAL_TABLET | Freq: Three times a day (TID) | ORAL | 0 refills | Status: DC

## 2020-01-12 MED ORDER — LAMOTRIGINE 100 MG PO TABS: 100.0000 mg | ORAL_TABLET | Freq: Every day | ORAL | 1 refills | Status: DC

## 2020-01-12 MED ORDER — LITHIUM CARBONATE 300 MG PO CAPS: 300.0000 mg | ORAL_CAPSULE | Freq: Every day | ORAL | 1 refills | Status: DC

## 2020-01-12 NOTE — Patient Instructions (Signed)
Propranolol 10 mg tablets, take 1-4 tablets twice daily as needed for tremor

## 2020-01-12 NOTE — Progress Notes (Signed)
Katrina Gamble [id number] [birth year] 79 y.o.     Subjective:   Patient ID:  Katrina Gamble is a 80 y.o. (DOB 17-Nov-1940) female.  Chief Complaint:  Chief Complaint  Patient presents with  . Follow-up  . Depression  . Anxiety  . Medication Problem    tremor    Depression        Associated symptoms include no decreased concentration, no fatigue and no suicidal ideas.  Past medical history includes anxiety.   Anxiety Patient reports no confusion, decreased concentration, nervous/anxious behavior or suicidal ideas.    Medication Refill Pertinent negatives include no abdominal pain, fatigue or weakness.   Katrina Gamble presents today for follow-up of ADD and mood.  When t seen May 15, 2019.  She continued to have cycles of depression without clear precipitant.  It was suggested that she increase lithium to 450 mg daily check a lithium level.  seen July 15, 2019.  She was having more depression.  The following changes were made. Rec increase lithium to 600 mg daily for a week and check blood level. If this fails to help or is intolerable then recommend lamotrigine trial as noted. She continued Ritalin 20 mg 3 times daily per usual  No lithium level nor BMP were received.  seen August 20 2019.  She was still dealing with depression and had not seen any improvement in depression at 600 mg versus 450 mg daily.  She did not get a blood level. Reduce lithium to 450 mg daily DT NR at 600 daily  recommend lamotrigine trial as noted.  She called back December 30 stating she had been on lamotrigine for 2 weeks and was noticing a tremor and wondered if it was related.  BC of tremor she went down again from 450 mg in lithium and had reduced it to 300 mg instead of 450 mg.  Last seen October 15, 2019.  The following was noted: CO  Worsening tremor intentional affecting mouse use about December 30.  Wonders about essential tremor vs TD.   Thyroid checked a  couple months ago and dose was not changed.  Tremor in head interfered with dental procedures some.  Alcohol does not help tremor and  Never has.   Essential tremor dx decades ago. Mood is better with lamotrigine 100 mg daily.  Mood more level.   Plan: No meds were changed.  She continued the following: Continue lamotrigine 100 mg daily as it was helpful. Continue Ritalin as prescribed. Continue lithium 300 mg nightly.  So far her mood is been stable with a lower dose Not using much propranolol.  01/12/2020 appointment, the following is noted: Questions about the propranolol.  Still has sig tremor that varies in intensity from mild to severe.  Not always bothered by it unless doing things with fine motor control like writing.  Has taken up to 20 mg propranolol and not more bc ? GI SE.   Mood still good.  Dep controlled.  Anxiety hard to tell bc D with recurrent breast CA.  Patient reports stable mood and denies depressed or irritable moods.  Patient denies any recent difficulty with anxiety.  Patient denies difficulty with sleep initiation or maintenance. Denies appetite disturbance.  Patient reports that energy and motivation have been good.  Patient denies any difficulty with concentration.  Patient denies any suicidal ideation.   Consistent with meds.  Holter monitor occ SVT and rare VT but not requiring treatment  H died 4 years ago  Youngest D relapse breast CA.  Stressful.  New great grand D [place].  Psych med history:  Abilify no response,  Mellaril,  Nardil, Prozac with side effects, try cyclic antidepressants, Viibryd, Trintellix caused anxiety,  Wellbutrin which caused tremors, imipramine, Pristiq 100,  Vyvanse, Cerefolin NAC, Ritalin, history of Dexedrine during pregnancy which caused weight gain,   lithium 600,  Propranolol remotely with little effect  Review of Systems:  Review of Systems  Constitutional: Negative for fatigue.  Gastrointestinal: Negative for abdominal pain.   Neurological: Positive for tremors. Negative for weakness.       Tremor worse at lithium 600 mg daily versus lithium 450 mg daily  Psychiatric/Behavioral: Positive for depression. Negative for agitation, behavioral problems, confusion, decreased concentration, dysphoric mood, hallucinations, self-injury, sleep disturbance and suicidal ideas. The patient is not nervous/anxious and is not hyperactive.     Medications: I have reviewed the patient's current medications.  Current Outpatient Medications  Medication Sig Dispense Refill  . amoxicillin-clavulanate (AUGMENTIN) 875-125 MG tablet daily.    Marland Kitchen lamoTRIgine (LAMICTAL) 100 MG tablet Take 1 tablet (100 mg total) by mouth daily. 90 tablet 1  . levothyroxine (SYNTHROID, LEVOTHROID) 25 MCG tablet Take 25 mcg by mouth daily before breakfast.    . lithium carbonate 300 MG capsule Take 1 capsule (300 mg total) by mouth daily. 90 capsule 1  . methylphenidate (RITALIN) 20 MG tablet Take 1 tablet (20 mg total) by mouth 3 (three) times daily with meals. 90 tablet 0  . propranolol (INDERAL) 10 MG tablet TAKE 1 TO 4 TABLETS BY MOUTH TWO TIMES A DAY 100 tablet 0  . [START ON 02/09/2020] methylphenidate (RITALIN) 20 MG tablet Take 1 tablet (20 mg total) by mouth 3 (three) times daily with meals. 90 tablet 0  . [START ON 03/08/2020] methylphenidate (RITALIN) 20 MG tablet Take 1 tablet (20 mg total) by mouth 3 (three) times daily with meals. 90 tablet 0   No current facility-administered medications for this visit.    Medication Side Effects: tremor is affecting handwriting and computer worse with 600 mg lithium..  Numbness in toes.  Hands and feet always cold.  Allergies:  Allergies  Allergen Reactions  . Codeine Other (See Comments)    Abdominal pain   . Latex Hives  . Septra [Sulfamethoxazole-Trimethoprim] Rash    Past Medical History:  Diagnosis Date  . Depression   . Osteopenia 11/2017   T score -1.1 FRAX 24% / 14%  . Tremor, essential   .  Vitamin D deficiency 12/2017    Family History  Problem Relation Age of Onset  . Hypertension Mother   . Thyroid disease Mother   . Breast cancer Mother 2  . Breast cancer Daughter 94  . Thyroid disease Daughter   . Other Father        Brain tumor  . Parkinson's disease Father   . Dementia Father   . Cancer Daughter        Melanoma  . Heart disease Paternal Grandfather     Social History   Socioeconomic History  . Marital status: Widowed    Spouse name: Not on file  . Number of children: Not on file  . Years of education: Not on file  . Highest education level: Not on file  Occupational History  . Not on file  Tobacco Use  . Smoking status: Former Smoker    Packs/day: 0.50    Years: 30.00    Pack years: 15.00    Types: Cigarettes  Quit date: 2012    Years since quitting: 9.3  . Smokeless tobacco: Never Used  Substance and Sexual Activity  . Alcohol use: Yes    Alcohol/week: 0.0 standard drinks    Comment: Occas.  . Drug use: No  . Sexual activity: Never    Birth control/protection: Post-menopausal    Comment: 1st intercourse 79 yo-Fewer than 5 partners  Other Topics Concern  . Not on file  Social History Narrative  . Not on file   Social Determinants of Health   Financial Resource Strain:   . Difficulty of Paying Living Expenses:   Food Insecurity:   . Worried About Charity fundraiser in the Last Year:   . Arboriculturist in the Last Year:   Transportation Needs:   . Film/video editor (Medical):   Marland Kitchen Lack of Transportation (Non-Medical):   Physical Activity:   . Days of Exercise per Week:   . Minutes of Exercise per Session:   Stress:   . Feeling of Stress :   Social Connections:   . Frequency of Communication with Friends and Family:   . Frequency of Social Gatherings with Friends and Family:   . Attends Religious Services:   . Active Member of Clubs or Organizations:   . Attends Archivist Meetings:   Marland Kitchen Marital Status:    Intimate Partner Violence:   . Fear of Current or Ex-Partner:   . Emotionally Abused:   Marland Kitchen Physically Abused:   . Sexually Abused:     Past Medical History, Surgical history, Social history, and Family history were reviewed and updated as appropriate.   Please see review of systems for further details on the patient's review from today.   Objective:   Physical Exam:  There were no vitals taken for this visit.  Physical Exam Constitutional:      General: She is not in acute distress.    Appearance: She is well-developed and normal weight.  Musculoskeletal:        General: No deformity.  Neurological:     Mental Status: She is alert and oriented to person, place, and time.     Cranial Nerves: No dysarthria.     Motor: Tremor present.     Coordination: Coordination normal.  Psychiatric:        Attention and Perception: Attention and perception normal. She does not perceive auditory or visual hallucinations.        Mood and Affect: Mood is not anxious or depressed. Affect is not labile, blunt, angry, tearful or inappropriate.        Speech: Speech normal.        Behavior: Behavior normal. Behavior is cooperative.        Thought Content: Thought content normal. Thought content is not paranoid or delusional. Thought content does not include homicidal or suicidal ideation. Thought content does not include homicidal or suicidal plan.        Cognition and Memory: Cognition and memory normal.        Judgment: Judgment normal.     Comments: Good insight Depression better    Today objectively the patient does not show significant mouth movements but she displayed video since her last visit in which typical mouth movements cheek movements associated with tardive dyskinesia was evident.  Her tremor is rhythmic and is not supportive of a tardive dyskinesia type tremor.  Lab Review:  No results found for: NA, K, CL, CO2, GLUCOSE, BUN, CREATININE, CALCIUM, PROT, ALBUMIN, AST,  ALT, ALKPHOS,  BILITOT, GFRNONAA, GFRAA  No results found for: WBC, RBC, HGB, HCT, PLT, MCV, MCH, MCHC, RDW, LYMPHSABS, MONOABS, EOSABS, BASOSABS  No results found for: POCLITH, LITHIUM   No results found for: PHENYTOIN, PHENOBARB, VALPROATE, CBMZ   She says lithium level was 0.6 at PCP on 450 mg daily.   Specifically her lithium level May 17, 2019 on this dosage of lithium was 0.3.  Her urinalysis was normal she had a normal CBC, comprehensive metabolic panel was normal including creatinine 0.83 calcium 9.9 and TSH was normal at 2.83.  Lithium level in April 2019 was 0.2 at this dosage.  We will not increase the lithium because she is currently having tremor at the low dose.  .res Assessment: Plan:    Depression, major, recurrent, in partial remission ([place]) - Plan: methylphenidate (RITALIN) 20 MG tablet, methylphenidate (RITALIN) 20 MG tablet, methylphenidate (RITALIN) 20 MG tablet  Tremor, essential  Attention deficit hyperactivity disorder (ADHD), predominantly inattentive type - Plan: methylphenidate (RITALIN) 20 MG tablet, methylphenidate (RITALIN) 20 MG tablet, methylphenidate (RITALIN) 20 MG tablet  Generalized anxiety disorder  Tardive dyskinesia  Lithium use  Low serum vitamin D  Severe episode of recurrent major depressive disorder, without psychotic features ([place]) - Plan: lamoTRIgine (LAMICTAL) 100 MG tablet  Major depressive disorder, recurrent episode, moderate ([facility]) - Plan: lithium carbonate 300 MG capsule, methylphenidate (RITALIN) 20 MG tablet   .Greater than 50% of 30-minute face to face time with patient was spent on counseling and coordination of care. We discussed Chantell has a long history over decades of cycling major depression and treatment resistance to meds noted above of nearly every antidepressant category.  Her best response so far has been from low-dose lithium with a stimulant but lamotrigine has seemed to help. .  They do not follow any pattern and do not  appear to have any precipitant.   While she does not identify herself is bipolar because her upswings seem only normal to her.  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  .  We tried an increase lithium to 600 mg daily and check a blood level.  She did not check the blood level.  The increase in lithium did not help her mood and her made her tremor worse. Counseled patient regarding potential benefits, risks, and side effects of lithium to include potential risk of lithium affecting thyroid and renal function.  Discussed need for periodic lab monitoring to determine drug level and to assess for potential adverse effects.  Counseled patient regarding signs and symptoms of lithium toxicity and advised that they notify office immediately or seek urgent medical attention if experiencing these signs and symptoms.  Patient advised to contact office with any questions or concerns.  Discussed drug interaction issues that could elevate lithium levels.  Got lithium level done at [facility] but does not show in Epic.  Will request.  She thinks it was 0.2 on 300 mg daily which she knows is below therapeutic range.  At visit December 2020 we added lamotrigine and increase to 100 mg daily.  That has been effective at easing the depression and helping her mood to feel much more normal.  She is not had any side effects.  Today objectively the patient does not show significant mouth movements but  she displayed video since her last visit in which typical mouth movements cheek movements associated with tardive dyskinesia was evident.  Her tremor is rhythmic and is not supportive of a tardive dyskinesia type tremor. History of essential tremor prior to lithium.  Inadequate trial propranolol. Option  propranolol.  Rec this trial and be more consistent with it to see if it helps. Disc dosing range and SE.  Call if SE. Propranolol 10 mg tablets, take 1-4 tablets twice daily as needed for tremor Alternative primidone.  Continue lamotrigine 100 mg daily as it was helpful. Continue Ritalin as prescribed. Continue lithium 300 mg nightly.  So far her mood is been stable with a lower dose  Discussed potential benefits, risks, and side effects of stimulants with patient to include increased heart rate, palpitations, insomnia, increased anxiety, increased irritability, or decreased appetite.  Instructed patient to contact office if experiencing any significant tolerability issues.  Continue  Vitamin D. Disc reasons for doing so for mental and physical health in detail.   FU 3 months Mos or sooner prn  Lynder Parents, MD, DFAPA '   Please see After Visit Summary for patient specific instructions.  Future Appointments  Date Time Provider [place]  06/16/2020  8:00 AM [id number] None  06/16/2020  9:20 AM Joseph Pierini, MD [facility]    No orders of the defined types were placed in this encounter.     -------------------------------

## 2020-02-01 ENCOUNTER — Other Ambulatory Visit: Payer: Self-pay | Admitting: Psychiatry

## 2020-02-01 DIAGNOSIS — G250 Essential tremor: Secondary | ICD-10-CM

## 2020-03-02 DIAGNOSIS — H5319 Other subjective visual disturbances: Secondary | ICD-10-CM | POA: Diagnosis not present

## 2020-03-25 DIAGNOSIS — K5900 Constipation, unspecified: Secondary | ICD-10-CM | POA: Diagnosis not present

## 2020-03-30 ENCOUNTER — Other Ambulatory Visit: Payer: Self-pay

## 2020-03-30 DIAGNOSIS — G250 Essential tremor: Secondary | ICD-10-CM

## 2020-03-30 MED ORDER — PROPRANOLOL HCL 10 MG PO TABS: ORAL_TABLET | ORAL | 0 refills | Status: DC

## 2020-04-13 ENCOUNTER — Ambulatory Visit (INDEPENDENT_AMBULATORY_CARE_PROVIDER_SITE_OTHER): Payer: Medicare Other | Admitting: Psychiatry

## 2020-04-13 ENCOUNTER — Encounter: Payer: Self-pay | Admitting: Psychiatry

## 2020-04-13 ENCOUNTER — Other Ambulatory Visit: Payer: Self-pay

## 2020-04-13 DIAGNOSIS — F3341 Major depressive disorder, recurrent, in partial remission: Secondary | ICD-10-CM

## 2020-04-13 DIAGNOSIS — F411 Generalized anxiety disorder: Secondary | ICD-10-CM

## 2020-04-13 DIAGNOSIS — R7989 Other specified abnormal findings of blood chemistry: Secondary | ICD-10-CM

## 2020-04-13 DIAGNOSIS — Z79899 Other long term (current) drug therapy: Secondary | ICD-10-CM

## 2020-04-13 DIAGNOSIS — F900 Attention-deficit hyperactivity disorder, predominantly inattentive type: Secondary | ICD-10-CM | POA: Diagnosis not present

## 2020-04-13 DIAGNOSIS — G250 Essential tremor: Secondary | ICD-10-CM | POA: Diagnosis not present

## 2020-04-13 DIAGNOSIS — F332 Major depressive disorder, recurrent severe without psychotic features: Secondary | ICD-10-CM

## 2020-04-13 MED ORDER — LAMOTRIGINE 150 MG PO TABS: 150.0000 mg | ORAL_TABLET | Freq: Every day | ORAL | 0 refills | Status: DC

## 2020-04-13 NOTE — Progress Notes (Signed)
Katrina Gamble [id number] 03-23-1941 79 y.o.     Subjective:   Patient ID:  Katrina Gamble is a 79 y.o. (DOB [birth year]) female.  Chief Complaint:  Chief Complaint  Patient presents with  . Follow-up  . Depression  . ADHD  . Anxiety  . Medication Reaction    tremor    Depression        Associated symptoms include no decreased concentration, no fatigue and no suicidal ideas.  Past medical history includes anxiety.   Anxiety Patient reports no confusion, decreased concentration, nervous/anxious behavior or suicidal ideas.    Medication Refill Pertinent negatives include no abdominal pain, fatigue or weakness.   Katrina Gamble presents today for follow-up of ADD and mood.  When t seen May 15, 2019.  She continued to have cycles of depression without clear precipitant.  It was suggested that she increase lithium to 450 mg daily check a lithium level.  seen July 15, 2019.  She was having more depression.  The following changes were made. Rec increase lithium to 600 mg daily for a week and check blood level. If this fails to help or is intolerable then recommend lamotrigine trial as noted. She continued Ritalin 20 mg 3 times daily per usual  No lithium level nor BMP were received.  seen August 20 2019.  She was still dealing with depression and had not seen any improvement in depression at 600 mg versus 450 mg daily.  She did not get a blood level. Reduce lithium to 450 mg daily DT NR at 600 daily  recommend lamotrigine trial as noted.  She called back December 30 stating she had been on lamotrigine for 2 weeks and was noticing a tremor and wondered if it was related.  BC of tremor she went down again from 450 mg in lithium and had reduced it to 300 mg instead of 450 mg.  Last seen October 15, 2019.  The following was noted: CO  Worsening tremor intentional affecting mouse use about December 30.  Wonders about essential tremor vs TD.   Thyroid  checked a couple months ago and dose was not changed.  Tremor in head interfered with dental procedures some.  Alcohol does not help tremor and  Never has.   Essential tremor dx decades ago. Mood is better with lamotrigine 100 mg daily.  Mood more level.   Plan: No meds were changed.  She continued the following: Continue lamotrigine 100 mg daily as it was helpful. Continue Ritalin as prescribed. Continue lithium 300 mg nightly.  So far her mood is been stable with a lower dose Not using much propranolol.  01/12/2020 appointment, the following is noted: Questions about the propranolol.  Still has sig tremor that varies in intensity from mild to severe.  Not always bothered by it unless doing things with fine motor control like writing.  Has taken up to 20 mg propranolol and not more bc ? GI SE.   Mood still good.  Dep controlled.  Anxiety hard to tell bc D with recurrent breast CA. Plan no meds changed.  04/13/2020 appointment with the following noted: Propranolol with limited effect on tremor even up to 50 mg daily.  Hard time writing. Worse with guilty, anxious depressed thoughts.  Neg self talk.  Not sure if anxiety triggered neg thinking.  No change stress or meds.  Sleep is disjointed.    Patient denies difficulty with sleep initiation or maintenance. Denies appetite disturbance.  Patient reports that energy  and motivation have been good.  Patient denies any difficulty with concentration.  Patient denies any suicidal ideation.   Consistent with meds.  Holter monitor occ SVT and rare VT but not requiring treatment  H died 4 years ago  Youngest D relapse breast CA.  Stressful.  New great grand D [place].  Psych med history:  Abilify no response,  Mellaril,  Nardil, Prozac with side effects, try cyclic antidepressants, Viibryd, Trintellix caused anxiety,  Wellbutrin which caused tremors, imipramine, Pristiq 100,  Vyvanse, Cerefolin NAC, Ritalin, history of Dexedrine during pregnancy which  caused weight gain,   lithium 600 tremor worse  Lamotrigine 150 Propranolol 50 without helpf for tremor  Review of Systems:  Review of Systems  Constitutional: Negative for fatigue.  Gastrointestinal: Negative for abdominal pain.  Neurological: Positive for tremors. Negative for weakness.       Tremor worse at lithium 600 mg daily versus lithium 450 mg daily  Psychiatric/Behavioral: Positive for depression and dysphoric mood. Negative for agitation, behavioral problems, confusion, decreased concentration, hallucinations, self-injury, sleep disturbance and suicidal ideas. The patient is not nervous/anxious and is not hyperactive.     Medications: I have reviewed the patient's current medications.  Current Outpatient Medications  Medication Sig Dispense Refill  . lamoTRIgine (LAMICTAL) 150 MG tablet Take 1 tablet (150 mg total) by mouth daily. 90 tablet 0  . levothyroxine (SYNTHROID, LEVOTHROID) 25 MCG tablet Take 25 mcg by mouth daily before breakfast.    . lithium carbonate 300 MG capsule Take 1 capsule (300 mg total) by mouth daily. 90 capsule 1  . methylphenidate (RITALIN) 20 MG tablet Take 1 tablet (20 mg total) by mouth 3 (three) times daily with meals. 90 tablet 0  . methylphenidate (RITALIN) 20 MG tablet Take 1 tablet (20 mg total) by mouth 3 (three) times daily with meals. 90 tablet 0  . methylphenidate (RITALIN) 20 MG tablet Take 1 tablet (20 mg total) by mouth 3 (three) times daily with meals. 90 tablet 0  . amoxicillin-clavulanate (AUGMENTIN) 875-125 MG tablet daily. (Patient not taking: Reported on 04/13/2020)     No current facility-administered medications for this visit.    Medication Side Effects: tremor is affecting handwriting and computer worse with 600 mg lithium..  Numbness in toes.  Hands and feet always cold.  Allergies:  Allergies  Allergen Reactions  . Codeine Other (See Comments)    Abdominal pain   . Latex Hives  . Septra [Sulfamethoxazole-Trimethoprim]  Rash    Past Medical History:  Diagnosis Date  . Depression   . Osteopenia 11/2017   T score -1.1 FRAX 24% / 14%  . Tremor, essential   . Vitamin D deficiency 12/2017    Family History  Problem Relation Age of Onset  . Hypertension Mother   . Thyroid disease Mother   . Breast cancer Mother 56  . Breast cancer Daughter 13  . Thyroid disease Daughter   . Other Father        Brain tumor  . Parkinson's disease Father   . Dementia Father   . Cancer Daughter        Melanoma  . Heart disease Paternal Grandfather     Social History   Socioeconomic History  . Marital status: Widowed    Spouse name: Not on file  . Number of children: Not on file  . Years of education: Not on file  . Highest education level: Not on file  Occupational History  . Not on file  Tobacco Use  .  Smoking status: Former Smoker    Packs/day: 0.50    Years: 30.00    Pack years: 15.00    Types: Cigarettes    Quit date: 2012    Years since quitting: 9.5  . Smokeless tobacco: Never Used  Vaping Use  . Vaping Use: Never used  Substance and Sexual Activity  . Alcohol use: Yes    Alcohol/week: 0.0 standard drinks    Comment: Occas.  . Drug use: No  . Sexual activity: Never    Birth control/protection: Post-menopausal    Comment: 1st intercourse 79 yo-Fewer than 5 partners  Other Topics Concern  . Not on file  Social History Narrative  . Not on file   Social Determinants of Health   Financial Resource Strain:   . Difficulty of Paying Living Expenses:   Food Insecurity:   . Worried About Charity fundraiser in the Last Year:   . Arboriculturist in the Last Year:   Transportation Needs:   . Film/video editor (Medical):   Marland Kitchen Lack of Transportation (Non-Medical):   Physical Activity:   . Days of Exercise per Week:   . Minutes of Exercise per Session:   Stress:   . Feeling of Stress :   Social Connections:   . Frequency of Communication with Friends and Family:   . Frequency of Social  Gatherings with Friends and Family:   . Attends Religious Services:   . Active Member of Clubs or Organizations:   . Attends Archivist Meetings:   Marland Kitchen Marital Status:   Intimate Partner Violence:   . Fear of Current or Ex-Partner:   . Emotionally Abused:   Marland Kitchen Physically Abused:   . Sexually Abused:     Past Medical History, Surgical history, Social history, and Family history were reviewed and updated as appropriate.   Please see review of systems for further details on the patient's review from today.   Objective:   Physical Exam:  There were no vitals taken for this visit.  Physical Exam Constitutional:      General: She is not in acute distress.    Appearance: She is well-developed and normal weight.  Musculoskeletal:        General: No deformity.  Neurological:     Mental Status: She is alert and oriented to person, place, and time.     Cranial Nerves: No dysarthria.     Motor: Tremor present.     Coordination: Coordination normal.  Psychiatric:        Attention and Perception: Attention and perception normal. She does not perceive auditory or visual hallucinations.        Mood and Affect: Mood is anxious and depressed. Affect is not labile, blunt, angry, tearful or inappropriate.        Speech: Speech normal.        Behavior: Behavior normal. Behavior is cooperative.        Thought Content: Thought content normal. Thought content is not paranoid or delusional. Thought content does not include homicidal or suicidal ideation. Thought content does not include homicidal or suicidal plan.        Cognition and Memory: Cognition and memory normal.        Judgment: Judgment normal.     Comments: Good insight Depression worse again    Today objectively the patient does not show significant mouth movements but she displayed video since her last visit in which typical mouth movements cheek movements associated with tardive  dyskinesia was evident.  Her tremor is rhythmic  and is not supportive of a tardive dyskinesia type tremor.  Lab Review:  No results found for: NA, K, CL, CO2, GLUCOSE, BUN, CREATININE, CALCIUM, PROT, ALBUMIN, AST, ALT, ALKPHOS, BILITOT, GFRNONAA, GFRAA  No results found for: WBC, RBC, HGB, HCT, PLT, MCV, MCH, MCHC, RDW, LYMPHSABS, MONOABS, EOSABS, BASOSABS  No results found for: POCLITH, LITHIUM   No results found for: PHENYTOIN, PHENOBARB, VALPROATE, CBMZ   She says lithium level was 0.6 at PCP on 450 mg daily.   Specifically her lithium level May 17, 2019 on this dosage of lithium was 0.3.  Her urinalysis was normal she had a normal CBC, comprehensive metabolic panel was normal including creatinine 0.83 calcium 9.9 and TSH was normal at 2.83.  Lithium level in April 2019 was 0.2 at this dosage.  We will not increase the lithium because she is currently having tremor at the low dose.  .res Assessment: Plan:    Depression, major, recurrent, in partial remission ([place])  Tremor, essential  Attention deficit hyperactivity disorder (ADHD), predominantly inattentive type  Generalized anxiety disorder  Lithium use  Low serum vitamin D  Severe episode of recurrent major depressive disorder, without psychotic features ([place]) - Plan: lamoTRIgine (LAMICTAL) 150 MG tablet   .Greater than 50% of 30-minute face to face time with patient was spent on counseling and coordination of care. We discussed Pansy has a long history over decades of cycling major depression and treatment resistance to meds noted above of nearly every antidepressant category.  Her best response so far has been from low-dose lithium with a stimulant but lamotrigine has seemed to help for 6 mos in 2020 until relapse in Summer. .  They do not follow any pattern and do not appear to have any precipitant.   While she does not identify herself is bipolar because her upswings seem only normal to her.  The cycling nature of her depression is more consistent with a bipolar  type depression than it is with major depression which does not typically occur with intermittent periods of several days of feeling euthymic and then back into depression again which is her pattern.  .  We tried an increase lithium to 600 mg daily and check a blood level.   The increase in lithium did not help her mood and her made her tremor worse. Counseled patient regarding potential benefits, risks, and side effects of lithium to include potential risk of lithium affecting thyroid and renal function.  Discussed need for periodic lab monitoring to determine drug level and to assess for potential adverse effects.  Counseled patient regarding signs and symptoms of lithium toxicity and advised that they notify office immediately or seek urgent medical attention if experiencing these signs and symptoms.  Patient advised to contact office with any questions or concerns.    At visit December 2020 we added lamotrigine and increase to 100 mg daily.  That had been effective at easing the depression and helping her mood to feel much more normal.  She is not had any side effects. But unfortunately lost the effect. Disc option of increasing the lamotrigine.  objectively the patient does not show significant mouth movements but she displayed video since her last visit in which typical mouth movements cheek movements associated with tardive dyskinesia was evident.  Her tremor is rhythmic and is not supportive of a tardive dyskinesia type tremor. History of essential tremor prior to lithium. Propranolol DC DT NR Alternative primidone.  Increase lamotrigine to 150 mg daily as it was helpful for several months at 100. Continue Ritalin as prescribed. Continue lithium 300 mg nightly.  So far her mood is been stable with a lower dose  Discussed potential benefits, risks, and side effects of stimulants with patient to include increased heart rate, palpitations, insomnia, increased anxiety, increased irritability,  or decreased appetite.  Instructed patient to contact office if experiencing any significant tolerability issues.  Continue  Vitamin D. Disc reasons for doing so for mental and physical health in detail.   FU 6-8 weeks  Lynder Parents, MD, DFAPA '   Please see After Visit Summary for patient specific instructions.  Future Appointments  Date Time Provider [place]  04/18/2020  1:30 PM Wallene Huh, DPM [facility]  06/16/2020  8:00 AM [facility] None  06/16/2020  9:20 AM Joseph Pierini, MD [facility]    No orders of the defined types were placed in this encounter.     -------------------------------

## 2020-04-13 NOTE — Patient Instructions (Signed)
Increase lamotrigine to 150 mg daily for depression

## 2020-04-18 ENCOUNTER — Ambulatory Visit (INDEPENDENT_AMBULATORY_CARE_PROVIDER_SITE_OTHER): Payer: Medicare Other

## 2020-04-18 ENCOUNTER — Encounter: Payer: Self-pay | Admitting: Podiatry

## 2020-04-18 ENCOUNTER — Ambulatory Visit (INDEPENDENT_AMBULATORY_CARE_PROVIDER_SITE_OTHER): Payer: Medicare Other | Admitting: Podiatry

## 2020-04-18 ENCOUNTER — Other Ambulatory Visit: Payer: Self-pay

## 2020-04-18 DIAGNOSIS — M779 Enthesopathy, unspecified: Secondary | ICD-10-CM | POA: Diagnosis not present

## 2020-04-18 DIAGNOSIS — M2042 Other hammer toe(s) (acquired), left foot: Secondary | ICD-10-CM | POA: Diagnosis not present

## 2020-04-18 DIAGNOSIS — M2041 Other hammer toe(s) (acquired), right foot: Secondary | ICD-10-CM

## 2020-04-18 DIAGNOSIS — G629 Polyneuropathy, unspecified: Secondary | ICD-10-CM

## 2020-04-18 NOTE — Progress Notes (Signed)
rig

## 2020-04-18 NOTE — Progress Notes (Signed)
Subjective:   Patient ID: Katrina Gamble, female   DOB: 79 y.o.   MRN: [id number]   HPI Patient presents stating she has had history of bunions and bumps on the outside also has digital deformities and is concerned about some discomfort she gets at night and what appears to be some kind of nerve pain.  Not sure if it is related to everything else and does have family history of this and does not smoke and is active   Review of Systems  All other systems reviewed and are negative.       Objective:  Physical Exam Vitals and nursing note reviewed.  Constitutional:      Appearance: She is well-developed.  Pulmonary:     Effort: Pulmonary effort is normal.  Musculoskeletal:        General: Normal range of motion.  Skin:    General: Skin is warm.  Neurological:     Mental Status: She is alert.     Neurovascular status was found to be intact muscle strength was adequate range of motion adequate.  I did note mild digital deformities bilateral large bunion deformity right over left with prominence of the fifth metatarsal head and upon questioning appears to have some low-grade neuropathic-like symptoms that are experienced.  I did not find any strong inflammation in the lesser MPJs bilateral and patient did have good digital perfusion well oriented     Assessment:  Probability that were dealing with some kind of a neuropathic versus inflammatory condition with structural deformity nonsymptomatic     Plan:  H&P reviewed all conditions and do think that structural correction is not necessary now but may be necessary at 1 point in future.  I discussed neuropathy I offered her the opportunity to consider gabapentin she denies wanting this and I discussed shoe gear modifications topical meds that she could use.  I do think she will need to start gabapentin if symptoms get worse  X-rays indicate that she does have structural bunion right over left tailor's bunion deformity bilateral no  indication of rigid hammertoe deformity bilateral

## 2020-05-25 ENCOUNTER — Encounter: Payer: Self-pay | Admitting: Psychiatry

## 2020-05-25 ENCOUNTER — Other Ambulatory Visit: Payer: Self-pay

## 2020-05-25 ENCOUNTER — Ambulatory Visit (INDEPENDENT_AMBULATORY_CARE_PROVIDER_SITE_OTHER): Payer: Medicare Other | Admitting: Psychiatry

## 2020-05-25 VITALS — BP 132/85 | HR 88

## 2020-05-25 DIAGNOSIS — F411 Generalized anxiety disorder: Secondary | ICD-10-CM

## 2020-05-25 DIAGNOSIS — Z79899 Other long term (current) drug therapy: Secondary | ICD-10-CM | POA: Diagnosis not present

## 2020-05-25 DIAGNOSIS — F900 Attention-deficit hyperactivity disorder, predominantly inattentive type: Secondary | ICD-10-CM | POA: Diagnosis not present

## 2020-05-25 DIAGNOSIS — F3341 Major depressive disorder, recurrent, in partial remission: Secondary | ICD-10-CM | POA: Diagnosis not present

## 2020-05-25 DIAGNOSIS — G2401 Drug induced subacute dyskinesia: Secondary | ICD-10-CM

## 2020-05-25 DIAGNOSIS — G250 Essential tremor: Secondary | ICD-10-CM

## 2020-05-25 DIAGNOSIS — R7989 Other specified abnormal findings of blood chemistry: Secondary | ICD-10-CM

## 2020-05-25 NOTE — Progress Notes (Signed)
[name] [id number] 11-22-1940 79 y.o.     Subjective:   Patient ID:  Katrina Gamble is a 79 y.o. (DOB 12/23/1940) female.  Chief Complaint:  Chief Complaint  Patient presents with  . Follow-up    Medication Management  . Depression    Medication Management  . Tremors    Depression        Associated symptoms include no decreased concentration, no fatigue and no suicidal ideas.  Past medical history includes anxiety.   Anxiety Patient reports no confusion, decreased concentration, nervous/anxious behavior, palpitations or suicidal ideas.    Medication Refill Pertinent negatives include no abdominal pain, fatigue or weakness.   Katrina Gamble presents today for follow-up of ADD and mood.  When t seen May 15, 2019.  She continued to have cycles of depression without clear precipitant.  It was suggested that she increase lithium to 450 mg daily check a lithium level.  seen July 15, 2019.  She was having more depression.  The following changes were made. Rec increase lithium to 600 mg daily for a week and check blood level. If this fails to help or is intolerable then recommend lamotrigine trial as noted. She continued Ritalin 20 mg 3 times daily per usual  No lithium level nor BMP were received.  seen August 20 2019.  She was still dealing with depression and had not seen any improvement in depression at 600 mg versus 450 mg daily.  She did not get a blood level. Reduce lithium to 450 mg daily DT NR at 600 daily  recommend lamotrigine trial as noted.  She called back December 30 stating she had been on lamotrigine for 2 weeks and was noticing a tremor and wondered if it was related.  BC of tremor she went down again from 450 mg in lithium and had reduced it to 300 mg instead of 450 mg.  Last seen October 15, 2019.  The following was noted: CO  Worsening tremor intentional affecting mouse use about December 30.  Wonders about essential tremor  vs TD.   Thyroid checked a couple months ago and dose was not changed.  Tremor in head interfered with dental procedures some.  Alcohol does not help tremor and  Never has.   Essential tremor dx decades ago. Mood is better with lamotrigine 100 mg daily.  Mood more level.   Plan: No meds were changed.  She continued the following: Continue lamotrigine 100 mg daily as it was helpful. Continue Ritalin as prescribed. Continue lithium 300 mg nightly.  So far her mood is been stable with a lower dose Not using much propranolol.  01/12/2020 appointment, the following is noted: Questions about the propranolol.  Still has sig tremor that varies in intensity from mild to severe.  Not always bothered by it unless doing things with fine motor control like writing.  Has taken up to 20 mg propranolol and not more bc ? GI SE.   Mood still good.  Dep controlled.  Anxiety hard to tell bc D with recurrent breast CA. Plan no meds changed.  04/13/2020 appointment with the following noted: Propranolol with limited effect on tremor even up to 50 mg daily.  Hard time writing. Worse with guilty, anxious depressed thoughts.  Neg self talk.  Not sure if anxiety triggered neg thinking.  No change stress or meds.  Sleep is disjointed.  Plan: Increase lamotrigine to 150 mg daily as it was helpful for several months at 100. Continue Ritalin  as prescribed. Continue lithium 300 mg nightly.   05/25/2020 appointment with the following noted: After increased lamotrigine to mood overall has been very good with mood.  Bad part is tremor has gotten much worse with intention.  Wonders about neurologist.    Patient denies difficulty with sleep initiation or maintenance. Denies appetite disturbance.  Patient reports that energy and motivation have been good.  Patient denies any difficulty with concentration. Patient denies any suicidal ideation.   Consistent with meds.  Holter monitor occ SVT and rare VT but not requiring  treatment  H died 4 years ago  Youngest D relapse breast CA.  Stressful.  New great grand D [place].  Psych med history:  Abilify no response,  Mellaril,  Nardil, Prozac with side effects, try cyclic antidepressants, Viibryd, Trintellix caused anxiety,  Wellbutrin which caused tremors, imipramine, Pristiq 100,  Vyvanse, Cerefolin NAC, Ritalin, history of Dexedrine during pregnancy which caused weight gain,   lithium 600 tremor worse  Lamotrigine 150 Propranolol 50 without helpf for tremor  Review of Systems:  Review of Systems  Constitutional: Negative for fatigue.  Cardiovascular: Negative for palpitations.  Gastrointestinal: Negative for abdominal pain.  Neurological: Positive for tremors. Negative for weakness.       Tremor worse at lithium 600 mg daily versus lithium 450 mg daily  Psychiatric/Behavioral: Positive for depression and dysphoric mood. Negative for agitation, behavioral problems, confusion, decreased concentration, hallucinations, self-injury, sleep disturbance and suicidal ideas. The patient is not nervous/anxious and is not hyperactive.     Medications: I have reviewed the patient's current medications.  Current Outpatient Medications  Medication Sig Dispense Refill  . lamoTRIgine (LAMICTAL) 150 MG tablet Take 1 tablet (150 mg total) by mouth daily. 90 tablet 0  . levothyroxine (SYNTHROID, LEVOTHROID) 25 MCG tablet Take 25 mcg by mouth daily before breakfast.    . lithium carbonate 300 MG capsule Take 1 capsule (300 mg total) by mouth daily. 90 capsule 1  . methylphenidate (RITALIN) 20 MG tablet Take 1 tablet (20 mg total) by mouth 3 (three) times daily with meals. 90 tablet 0  . methylphenidate (RITALIN) 20 MG tablet Take 1 tablet (20 mg total) by mouth 3 (three) times daily with meals. 90 tablet 0  . methylphenidate (RITALIN) 20 MG tablet Take 1 tablet (20 mg total) by mouth 3 (three) times daily with meals. 90 tablet 0   No current facility-administered  medications for this visit.    Medication Side Effects: tremor is affecting handwriting and computer worse with 600 mg lithium..  Numbness in toes.  Hands and feet always cold.  Allergies:  Allergies  Allergen Reactions  . Codeine Other (See Comments)    Abdominal pain   . Latex Hives  . Septra [Sulfamethoxazole-Trimethoprim] Rash    Past Medical History:  Diagnosis Date  . Depression   . Osteopenia 11/2017   T score -1.1 FRAX 24% / 14%  . Tremor, essential   . Vitamin D deficiency 12/2017    Family History  Problem Relation Age of Onset  . Hypertension Mother   . Thyroid disease Mother   . Breast cancer Mother 73  . Breast cancer Daughter 50  . Thyroid disease Daughter   . Other Father        Brain tumor  . Parkinson's disease Father   . Dementia Father   . Cancer Daughter        Melanoma  . Heart disease Paternal Grandfather     Social History   Socioeconomic  History  . Marital status: Widowed    Spouse name: Not on file  . Number of children: Not on file  . Years of education: Not on file  . Highest education level: Not on file  Occupational History  . Not on file  Tobacco Use  . Smoking status: Former Smoker    Packs/day: 0.50    Years: 30.00    Pack years: 15.00    Types: Cigarettes    Quit date: 2012    Years since quitting: 9.7  . Smokeless tobacco: Never Used  Vaping Use  . Vaping Use: Never used  Substance and Sexual Activity  . Alcohol use: Yes    Alcohol/week: 0.0 standard drinks    Comment: Occas.  . Drug use: No  . Sexual activity: Never    Birth control/protection: Post-menopausal    Comment: 1st intercourse 79 yo-Fewer than 5 partners  Other Topics Concern  . Not on file  Social History Narrative  . Not on file   Social Determinants of Health   Financial Resource Strain:   . Difficulty of Paying Living Expenses: Not on file  Food Insecurity:   . Worried About Charity fundraiser in the Last Year: Not on file  . Ran Out of  Food in the Last Year: Not on file  Transportation Needs:   . Lack of Transportation (Medical): Not on file  . Lack of Transportation (Non-Medical): Not on file  Physical Activity:   . Days of Exercise per Week: Not on file  . Minutes of Exercise per Session: Not on file  Stress:   . Feeling of Stress : Not on file  Social Connections:   . Frequency of Communication with Friends and Family: Not on file  . Frequency of Social Gatherings with Friends and Family: Not on file  . Attends Religious Services: Not on file  . Active Member of Clubs or Organizations: Not on file  . Attends Archivist Meetings: Not on file  . Marital Status: Not on file  Intimate Partner Violence:   . Fear of Current or Ex-Partner: Not on file  . Emotionally Abused: Not on file  . Physically Abused: Not on file  . Sexually Abused: Not on file    Past Medical History, Surgical history, Social history, and Family history were reviewed and updated as appropriate.   Please see review of systems for further details on the patient's review from today.   Objective:   Physical Exam:  BP 132/85   Pulse 88   Physical Exam Constitutional:      General: She is not in acute distress.    Appearance: She is well-developed and normal weight.  Musculoskeletal:        General: No deformity.  Neurological:     Mental Status: She is alert and oriented to person, place, and time.     Cranial Nerves: No dysarthria.     Motor: Tremor present.     Coordination: Coordination normal.  Psychiatric:        Attention and Perception: Attention and perception normal. She does not perceive auditory or visual hallucinations.        Mood and Affect: Mood is not depressed. Affect is not labile, blunt, angry, tearful or inappropriate.        Speech: Speech normal.        Behavior: Behavior normal. Behavior is cooperative.        Thought Content: Thought content normal. Thought content is not paranoid  or delusional.  Thought content does not include homicidal or suicidal ideation. Thought content does not include homicidal or suicidal plan.        Cognition and Memory: Cognition and memory normal.        Judgment: Judgment normal.     Comments: Good insight Depression better after increase lamotrigine    Today objectively the patient does not show significant mouth movements but she displayed video since her last visit in which typical mouth movements cheek movements associated with tardive dyskinesia was evident.  Her tremor is rhythmic and is not supportive of a tardive dyskinesia type tremor.  Lab Review:  No results found for: NA, K, CL, CO2, GLUCOSE, BUN, CREATININE, CALCIUM, PROT, ALBUMIN, AST, ALT, ALKPHOS, BILITOT, GFRNONAA, GFRAA  No results found for: WBC, RBC, HGB, HCT, PLT, MCV, MCH, MCHC, RDW, LYMPHSABS, MONOABS, EOSABS, BASOSABS  No results found for: POCLITH, LITHIUM   No results found for: PHENYTOIN, PHENOBARB, VALPROATE, CBMZ   She says lithium level was 0.6 at PCP on 450 mg daily.   Specifically her lithium level May 17, 2019 on this dosage of lithium was 0.3.  Her urinalysis was normal she had a normal CBC, comprehensive metabolic panel was normal including creatinine 0.83 calcium 9.9 and TSH was normal at 2.83.  Lithium level in April 2019 was 0.2 at this dosage.  We will not increase the lithium because she is currently having tremor at the low dose.  .res Assessment: Plan:    Depression, major, recurrent, in partial remission ([place])  Tremor, essential  Attention deficit hyperactivity disorder (ADHD), predominantly inattentive type  Generalized anxiety disorder  Lithium use  Low serum vitamin D  Tardive dyskinesia   .Greater than 50% of 30-minute face to face time with patient was spent on counseling and coordination of care. We discussed Catera has a long history over decades of cycling major depression and treatment resistance to meds noted above of nearly every  antidepressant category.  Her best response so far has been from low-dose lithium with a stimulant but lamotrigine has seemed to help for 6 mos in 2020 until relapse in Summer.  The depression resolved again with the increase in lamotrigine from 100 to 150 mg daily.  They do not follow any pattern and do not appear to have any precipitant.   While she does not identify herself is bipolar because her upswings seem only normal to her.  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  We tried an increase lithium to 600 mg daily and check a blood level.   The increase in lithium did not help her mood and her made her tremor worse. Counseled patient regarding potential benefits, risks, and side effects of lithium to include potential risk of lithium affecting thyroid and renal function.  Discussed need for periodic lab monitoring to determine drug level and to assess for potential adverse effects.  Counseled patient regarding signs and symptoms of lithium toxicity and advised that they notify office immediately or seek urgent medical attention if experiencing these signs and symptoms.  Patient advised to contact office with any questions or concerns.    The tremor predates lithium and predates stimulant use but appears to have been made worse by the increase in lamotrigine.  It appears to be predominantly  an intention tremor.  She plans to see a neurologist.  At visit December 2020 we added lamotrigine and increase to 100 mg daily.  That had been effective at easing the depression and helping her mood to feel much more normal.  She is not had any side effects. But unfortunately lost the effect. Disc option of increasing the lamotrigine.  objectively the patient does not show significant  mouth movements but she displayed video since her last visit in which typical mouth movements cheek movements associated with tardive dyskinesia was evident.  Her tremor is rhythmic and is not supportive of a tardive dyskinesia type tremor. History of essential tremor prior to lithium. Propranolol DC DT NR Alternative primidone. Disc seeing neurologist.  She agrees.  The ncrease lamotrigine to 150 mg daily resolved the depression.  it was helpful for several months at 100. Continue Ritalin as prescribed 3/4 tablet QID.  Prefers Malincroft generic. She got different brand Accord of methylphenidate and it shatters when tries to quarter it. Continue lithium 300 mg nightly.  So far her mood is been stable with a lower dose  Discussed potential benefits, risks, and side effects of stimulants with patient to include increased heart rate, palpitations, insomnia, increased anxiety, increased irritability, or decreased appetite.  Instructed patient to contact office if experiencing any significant tolerability issues.  Continue  Vitamin D. Disc reasons for doing so for mental and physical health in detail.  She has stopped bc she hates taking pills.  FU 12 weeks  Lynder Parents, MD, DFAPA '   Please see After Visit Summary for patient specific instructions.  Future Appointments  Date Time Provider [place]  06/16/2020  8:00 AM [id number] None  06/16/2020  9:20 AM Joseph Pierini, MD [facility]    No orders of the defined types were placed in this encounter.     -------------------------------

## 2020-05-27 ENCOUNTER — Telehealth: Payer: Self-pay | Admitting: Psychiatry

## 2020-05-27 NOTE — Telephone Encounter (Signed)
Pt called and is needing a referral to [facility] per Dr. Clovis Pu suggestion. She is seeing Dr. Evon Slack. [phone].

## 2020-05-27 NOTE — Telephone Encounter (Signed)
This will be reviewed next week

## 2020-05-31 ENCOUNTER — Other Ambulatory Visit: Payer: Self-pay | Admitting: Psychiatry

## 2020-05-31 ENCOUNTER — Telehealth: Payer: Self-pay | Admitting: Psychiatry

## 2020-05-31 DIAGNOSIS — F3341 Major depressive disorder, recurrent, in partial remission: Secondary | ICD-10-CM

## 2020-05-31 DIAGNOSIS — F331 Major depressive disorder, recurrent, moderate: Secondary | ICD-10-CM

## 2020-05-31 DIAGNOSIS — F900 Attention-deficit hyperactivity disorder, predominantly inattentive type: Secondary | ICD-10-CM

## 2020-05-31 MED ORDER — METHYLPHENIDATE HCL 20 MG PO TABS: ORAL_TABLET | ORAL | 0 refills | Status: DC

## 2020-05-31 NOTE — Telephone Encounter (Signed)
Do you know what this is referring to?

## 2020-05-31 NOTE — Telephone Encounter (Signed)
Please refer her to give her neurology for an intention tremor that is getting progressively worse.  Of note the patient has a history of buccolingual tardive dyskinesia but that appears stable at present.  She is not on an antipsychotic.

## 2020-05-31 NOTE — Telephone Encounter (Signed)
Prefers Malincroft generic. She got different brand Accord of methylphenidate and it shatters when tries to quarter it. This info communicated to pharmacy in hopes they will order Malincroft version for her

## 2020-05-31 NOTE — Telephone Encounter (Signed)
Patient called and just had an appointment with Dr. Clovis Pu. She needs a refill on her Methylphenidate 20 mg called to Kristopher Oppenheim at [facility] in [place]. At her visit she had mentioned to Dr. Clovis Pu that the new brand of Methylphenidate pills shattered when she took them.

## 2020-06-01 NOTE — Telephone Encounter (Signed)
reviewed

## 2020-06-03 DIAGNOSIS — Z23 Encounter for immunization: Secondary | ICD-10-CM | POA: Diagnosis not present

## 2020-06-03 NOTE — Telephone Encounter (Signed)
Reviewed thank you 

## 2020-06-07 ENCOUNTER — Telehealth: Payer: Self-pay | Admitting: Psychiatry

## 2020-06-07 NOTE — Telephone Encounter (Signed)
FYI

## 2020-06-07 NOTE — Telephone Encounter (Signed)
thanks

## 2020-06-07 NOTE — Telephone Encounter (Signed)
Katrina Gamble was referred to [facility] and she is scheduled with them 12/1 @2 :30

## 2020-06-13 ENCOUNTER — Encounter: Payer: Self-pay | Admitting: Obstetrics and Gynecology

## 2020-06-16 ENCOUNTER — Other Ambulatory Visit: Payer: Self-pay

## 2020-06-16 ENCOUNTER — Encounter: Payer: Self-pay | Admitting: Obstetrics and Gynecology

## 2020-06-16 ENCOUNTER — Ambulatory Visit (INDEPENDENT_AMBULATORY_CARE_PROVIDER_SITE_OTHER): Payer: Medicare Other | Admitting: Obstetrics and Gynecology

## 2020-06-16 ENCOUNTER — Ambulatory Visit (INDEPENDENT_AMBULATORY_CARE_PROVIDER_SITE_OTHER): Payer: Medicare Other

## 2020-06-16 VITALS — BP 122/78

## 2020-06-16 DIAGNOSIS — N854 Malposition of uterus: Secondary | ICD-10-CM

## 2020-06-16 DIAGNOSIS — N83202 Unspecified ovarian cyst, left side: Secondary | ICD-10-CM

## 2020-06-16 DIAGNOSIS — Z23 Encounter for immunization: Secondary | ICD-10-CM

## 2020-06-16 NOTE — Progress Notes (Signed)
   Katrina Gamble August 02, 1941 [id number]  SUBJECTIVE:  79 y.o. [id number] female presents for pelvic ultrasound follow-up for a small simple left ovarian cyst 12 x 11 mm,avascular as noted on her ultrasound 12/10/2019.  She was having LLQ pain at that time and a few weeks after coming here she was worked up further and found to have diverticulitis.  The pain is improved since then.  She has not had any vaginal bleeding.  Current Outpatient Medications  Medication Sig Dispense Refill  . lamoTRIgine (LAMICTAL) 150 MG tablet Take 1 tablet (150 mg total) by mouth daily. 90 tablet 0  . levothyroxine (SYNTHROID, LEVOTHROID) 25 MCG tablet Take 25 mcg by mouth daily before breakfast.    . lithium carbonate 300 MG capsule Take 1 capsule (300 mg total) by mouth daily. 90 capsule 1  . methylphenidate (RITALIN) 20 MG tablet Take 1 tablet (20 mg total) by mouth 3 (three) times daily with meals. 90 tablet 0  . methylphenidate (RITALIN) 20 MG tablet Take 1 tablet (20 mg total) by mouth 3 (three) times daily with meals. 90 tablet 0  . methylphenidate (RITALIN) 20 MG tablet 3/4 tablet four times daily 90 tablet 0   No current facility-administered medications for this visit.   Allergies: Codeine, Latex, and Septra [sulfamethoxazole-trimethoprim]  No LMP recorded. Patient is postmenopausal.  Past medical history,surgical history, problem list, medications, allergies, family history and social history were all reviewed and documented as reviewed in the EPIC chart.   OBJECTIVE:  BP 122/78  The patient appears well, alert, oriented, in no distress.  Pelvic ultrasound Anteverted uterus 4.0 x 3.4 x 2.1 cm.  No myometrial masses. Endometrium 1.1 mm thickness.  Small fluid in endometrial and endocervical cavity.  No polyps. Right ovary with limited views due to overlying bowel. Left ovary with small cysts largest measuring 1.5 x 1.1 cm.  Simple and avascular.  Septation versus separate cysts measure 0.6 x 0.5 cm  and 0.5 x 0.6 cm.  Overall stable appearance from previous ultrasound. Inflammation of sigmoid colon on left as previously seen.   ASSESSMENT:  79 y.o. [id number] here for ultrasound follow-up of small left ovarian cyst  PLAN:  The patient is reassured by the stable appearance of the ovarian cyst on imaging today.  It has not grown and comparing the images to those from April 21, the left ovary and cyst appears stable.  Her left lower quadrant pain has improved since being managed for diverticulitis.  I would recommend discussing at her next annual exam about repeating an ultrasound on an annual basis to demonstrate continued stability of the cyst, with the next one being next fall.  She is comfortable with this plan.   Joseph Pierini MD 06/16/20

## 2020-06-16 NOTE — Addendum Note (Signed)
Addended by: Nelva Nay on: 06/16/2020 09:18 AM   Modules accepted: Orders

## 2020-06-20 ENCOUNTER — Other Ambulatory Visit: Payer: Self-pay | Admitting: Obstetrics and Gynecology

## 2020-06-20 DIAGNOSIS — Z1231 Encounter for screening mammogram for malignant neoplasm of breast: Secondary | ICD-10-CM

## 2020-07-04 ENCOUNTER — Other Ambulatory Visit: Payer: Self-pay | Admitting: Psychiatry

## 2020-07-04 ENCOUNTER — Telehealth: Payer: Self-pay | Admitting: Psychiatry

## 2020-07-04 DIAGNOSIS — F900 Attention-deficit hyperactivity disorder, predominantly inattentive type: Secondary | ICD-10-CM

## 2020-07-04 DIAGNOSIS — F3341 Major depressive disorder, recurrent, in partial remission: Secondary | ICD-10-CM

## 2020-07-04 DIAGNOSIS — F331 Major depressive disorder, recurrent, moderate: Secondary | ICD-10-CM

## 2020-07-04 MED ORDER — METHYLPHENIDATE HCL 20 MG PO TABS: 20.0000 mg | ORAL_TABLET | Freq: Three times a day (TID) | ORAL | 0 refills | Status: DC

## 2020-07-04 MED ORDER — METHYLPHENIDATE HCL 20 MG PO TABS: ORAL_TABLET | ORAL | 0 refills | Status: DC

## 2020-07-04 NOTE — Telephone Encounter (Signed)
Pt LM on VM requesting refill for Methylphenidate (Ritalin)  20 mg @  [place] [address]

## 2020-07-04 NOTE — Telephone Encounter (Signed)
Sent #3

## 2020-07-06 ENCOUNTER — Other Ambulatory Visit: Payer: Self-pay | Admitting: Family Medicine

## 2020-07-06 ENCOUNTER — Ambulatory Visit
Admission: RE | Admit: 2020-07-06 | Discharge: 2020-07-06 | Disposition: A | Discharge status: Home / Self Care | Payer: Medicare Other | Source: Ambulatory Visit | Attending: Family Medicine | Admitting: Family Medicine

## 2020-07-06 DIAGNOSIS — N83292 Other ovarian cyst, left side: Secondary | ICD-10-CM | POA: Diagnosis not present

## 2020-07-06 DIAGNOSIS — R1032 Left lower quadrant pain: Secondary | ICD-10-CM | POA: Diagnosis not present

## 2020-07-06 DIAGNOSIS — N281 Cyst of kidney, acquired: Secondary | ICD-10-CM | POA: Diagnosis not present

## 2020-07-06 DIAGNOSIS — N323 Diverticulum of bladder: Secondary | ICD-10-CM | POA: Diagnosis not present

## 2020-07-06 DIAGNOSIS — R1031 Right lower quadrant pain: Secondary | ICD-10-CM

## 2020-07-06 DIAGNOSIS — K7689 Other specified diseases of liver: Secondary | ICD-10-CM | POA: Diagnosis not present

## 2020-07-06 MED ORDER — IOPAMIDOL (ISOVUE-300) INJECTION 61%
100.0000 mL | Freq: Once | INTRAVENOUS | Status: AC | PRN
  Administered 2020-07-06: 100 mL via INTRAVENOUS

## 2020-07-13 ENCOUNTER — Other Ambulatory Visit: Payer: Self-pay | Admitting: Psychiatry

## 2020-07-13 DIAGNOSIS — F332 Major depressive disorder, recurrent severe without psychotic features: Secondary | ICD-10-CM

## 2020-07-20 DIAGNOSIS — Z0000 Encounter for general adult medical examination without abnormal findings: Secondary | ICD-10-CM | POA: Diagnosis not present

## 2020-07-20 DIAGNOSIS — M8580 Other specified disorders of bone density and structure, unspecified site: Secondary | ICD-10-CM | POA: Diagnosis not present

## 2020-07-20 DIAGNOSIS — E559 Vitamin D deficiency, unspecified: Secondary | ICD-10-CM | POA: Diagnosis not present

## 2020-07-20 DIAGNOSIS — Z872 Personal history of diseases of the skin and subcutaneous tissue: Secondary | ICD-10-CM | POA: Diagnosis not present

## 2020-07-20 DIAGNOSIS — Z13220 Encounter for screening for lipoid disorders: Secondary | ICD-10-CM | POA: Diagnosis not present

## 2020-07-20 DIAGNOSIS — Z79899 Other long term (current) drug therapy: Secondary | ICD-10-CM | POA: Diagnosis not present

## 2020-07-20 DIAGNOSIS — R899 Unspecified abnormal finding in specimens from other organs, systems and tissues: Secondary | ICD-10-CM | POA: Diagnosis not present

## 2020-07-20 DIAGNOSIS — F339 Major depressive disorder, recurrent, unspecified: Secondary | ICD-10-CM | POA: Diagnosis not present

## 2020-07-20 DIAGNOSIS — E039 Hypothyroidism, unspecified: Secondary | ICD-10-CM | POA: Diagnosis not present

## 2020-07-20 DIAGNOSIS — Z136 Encounter for screening for cardiovascular disorders: Secondary | ICD-10-CM | POA: Diagnosis not present

## 2020-07-25 ENCOUNTER — Other Ambulatory Visit: Payer: Self-pay

## 2020-07-25 ENCOUNTER — Ambulatory Visit
Admission: RE | Admit: 2020-07-25 | Discharge: 2020-07-25 | Disposition: A | Discharge status: Home / Self Care | Payer: Medicare Other | Source: Ambulatory Visit | Attending: Obstetrics and Gynecology | Admitting: Obstetrics and Gynecology

## 2020-07-25 DIAGNOSIS — Z1231 Encounter for screening mammogram for malignant neoplasm of breast: Secondary | ICD-10-CM

## 2020-08-09 DIAGNOSIS — J069 Acute upper respiratory infection, unspecified: Secondary | ICD-10-CM | POA: Diagnosis not present

## 2020-08-10 ENCOUNTER — Ambulatory Visit: Payer: Medicare Other | Admitting: Neurology

## 2020-08-11 DIAGNOSIS — Z20822 Contact with and (suspected) exposure to covid-19: Secondary | ICD-10-CM | POA: Diagnosis not present

## 2020-08-11 DIAGNOSIS — J069 Acute upper respiratory infection, unspecified: Secondary | ICD-10-CM | POA: Diagnosis not present

## 2020-08-15 ENCOUNTER — Other Ambulatory Visit: Payer: Self-pay | Admitting: Psychiatry

## 2020-08-15 DIAGNOSIS — F331 Major depressive disorder, recurrent, moderate: Secondary | ICD-10-CM

## 2020-08-24 ENCOUNTER — Ambulatory Visit (INDEPENDENT_AMBULATORY_CARE_PROVIDER_SITE_OTHER): Payer: Medicare Other | Admitting: Psychiatry

## 2020-08-24 ENCOUNTER — Other Ambulatory Visit: Payer: Self-pay

## 2020-08-24 ENCOUNTER — Encounter: Payer: Self-pay | Admitting: Psychiatry

## 2020-08-24 ENCOUNTER — Telehealth: Payer: Self-pay

## 2020-08-24 DIAGNOSIS — Z79899 Other long term (current) drug therapy: Secondary | ICD-10-CM

## 2020-08-24 DIAGNOSIS — F331 Major depressive disorder, recurrent, moderate: Secondary | ICD-10-CM | POA: Diagnosis not present

## 2020-08-24 DIAGNOSIS — F3341 Major depressive disorder, recurrent, in partial remission: Secondary | ICD-10-CM

## 2020-08-24 DIAGNOSIS — G2401 Drug induced subacute dyskinesia: Secondary | ICD-10-CM | POA: Diagnosis not present

## 2020-08-24 DIAGNOSIS — G250 Essential tremor: Secondary | ICD-10-CM

## 2020-08-24 DIAGNOSIS — F411 Generalized anxiety disorder: Secondary | ICD-10-CM

## 2020-08-24 DIAGNOSIS — R7989 Other specified abnormal findings of blood chemistry: Secondary | ICD-10-CM

## 2020-08-24 DIAGNOSIS — F900 Attention-deficit hyperactivity disorder, predominantly inattentive type: Secondary | ICD-10-CM | POA: Diagnosis not present

## 2020-08-24 DIAGNOSIS — F332 Major depressive disorder, recurrent severe without psychotic features: Secondary | ICD-10-CM | POA: Diagnosis not present

## 2020-08-24 MED ORDER — METHYLPHENIDATE HCL 20 MG PO TABS: 20.0000 mg | ORAL_TABLET | Freq: Three times a day (TID) | ORAL | 0 refills | Status: DC

## 2020-08-24 MED ORDER — METHYLPHENIDATE HCL 20 MG PO TABS: ORAL_TABLET | ORAL | 0 refills | Status: DC

## 2020-08-24 MED ORDER — LAMOTRIGINE 150 MG PO TABS: 150.0000 mg | ORAL_TABLET | Freq: Every day | ORAL | 1 refills | Status: DC

## 2020-08-24 MED ORDER — LITHIUM CARBONATE 300 MG PO CAPS: 300.0000 mg | ORAL_CAPSULE | Freq: Every day | ORAL | 0 refills | Status: DC

## 2020-08-24 NOTE — Progress Notes (Signed)
Katrina Gamble [id number] 1940-12-03 79 y.o.     Subjective:   Patient ID:  Katrina Gamble is a 79 y.o. (DOB [birth year]) female.  Chief Complaint:  Chief Complaint  Patient presents with  . Follow-up  . Depression  . Anxiety  . ADHD    Depression        Associated symptoms include no decreased concentration, no fatigue and no suicidal ideas.  Past medical history includes anxiety.   Anxiety Patient reports no confusion, decreased concentration, nervous/anxious behavior, palpitations or suicidal ideas.    Medication Refill Pertinent negatives include no abdominal pain, fatigue or weakness.   Katrina Gamble presents today for follow-up of ADD and mood.  When t seen May 15, 2019.  She continued to have cycles of depression without clear precipitant.  It was suggested that she increase lithium to 450 mg daily check a lithium level.  seen July 15, 2019.  She was having more depression.  The following changes were made. Rec increase lithium to 600 mg daily for a week and check blood level. If this fails to help or is intolerable then recommend lamotrigine trial as noted. She continued Ritalin 20 mg 3 times daily per usual  No lithium level nor BMP were received.  seen August 20 2019.  She was still dealing with depression and had not seen any improvement in depression at 600 mg versus 450 mg daily.  She did not get a blood level. Reduce lithium to 450 mg daily DT NR at 600 daily  recommend lamotrigine trial as noted.  She called back December 30 stating she had been on lamotrigine for 2 weeks and was noticing a tremor and wondered if it was related.  BC of tremor she went down again from 450 mg in lithium and had reduced it to 300 mg instead of 450 mg.  seen October 15, 2019.  The following was noted: CO  Worsening tremor intentional affecting mouse use about December 30.  Wonders about essential tremor vs TD.   Thyroid checked a couple months ago and  dose was not changed.  Tremor in head interfered with dental procedures some.  Alcohol does not help tremor and  Never has.   Essential tremor dx decades ago. Mood is better with lamotrigine 100 mg daily.  Mood more level.   Plan: No meds were changed.  She continued the following: Continue lamotrigine 100 mg daily as it was helpful. Continue Ritalin as prescribed. Continue lithium 300 mg nightly.  So far her mood is been stable with a lower dose Not using much propranolol.  01/12/2020 appointment, the following is noted: Questions about the propranolol.  Still has sig tremor that varies in intensity from mild to severe.  Not always bothered by it unless doing things with fine motor control like writing.  Has taken up to 20 mg propranolol and not more bc ? GI SE.   Mood still good.  Dep controlled.  Anxiety hard to tell bc D with recurrent breast CA. Plan no meds changed.  04/13/2020 appointment with the following noted: Propranolol with limited effect on tremor even up to 50 mg daily.  Hard time writing. Worse with guilty, anxious depressed thoughts.  Neg self talk.  Not sure if anxiety triggered neg thinking.  No change stress or meds.  Sleep is disjointed.  Plan: Increase lamotrigine to 150 mg daily as it was helpful for several months at 100. Continue Ritalin as prescribed. Continue lithium 300 mg nightly.  05/25/2020 appointment with the following noted: After increased lamotrigine to mood overall has been very good with mood.  Bad part is tremor has gotten much worse with intention.  Wonders about neurologist. No med changes  08/24/2020 appointment with the following noted:  Disc lithium and kidney function and questions about CKD 3. Missed neuro appt bc got sick right before it.  RS for FEB.  But opening for tomorrow.  Tremor is increasing . Insomnia is worse.  Trouble going to sleep for a couple of hours.  3 nights per week. Racing thoughts all over the place.  Some are regretful  thoughts from the past but random things.  Don't have the thoughts in the day. No naps. Cut caffeine max 2 cups coffee as late as 11 AM. Ritalin as late as 630 pm and usually that won't bother sleep.  Kids think her ADD meds aren't working well.  Scattered and hard to stay on task.   Not hyper spending or otherwise hyper.  Always tendency to talk if someone to listen. Can internally feel tense.  Not irritable. Doesn't want more meds. Pretty good with depression.  Denies appetite disturbance.  Patient reports that energy and motivation have been good.  Patient denies any difficulty with concentration. Patient denies any suicidal ideation.   Consistent with meds.  Holter monitor occ SVT and rare VT but not requiring treatment  H died 4 years ago  Youngest D relapse breast CA.  Stressful.  New great grand D [place].  Psych med history:  Abilify no response,  Mellaril,  Nardil, Prozac with side effects, try cyclic antidepressants, Viibryd, Trintellix caused anxiety,  Wellbutrin which caused tremors, imipramine, Pristiq 100,  Vyvanse, Cerefolin NAC, Ritalin, history of Dexedrine during pregnancy which caused weight gain,   lithium 600 tremor worse (started lithium 150 mg Nov 2018) Lamotrigine 150 Propranolol 50 without helpf for tremor  Review of Systems:  Review of Systems  Constitutional: Negative for fatigue.  Cardiovascular: Negative for palpitations.  Gastrointestinal: Negative for abdominal pain.  Neurological: Positive for tremors. Negative for weakness.       Tremor worse at lithium 600 mg daily versus lithium 450 mg daily  Psychiatric/Behavioral: Positive for depression, dysphoric mood and sleep disturbance. Negative for agitation, behavioral problems, confusion, decreased concentration, hallucinations, self-injury and suicidal ideas. The patient is not nervous/anxious and is not hyperactive.     Medications: I have reviewed the patient's current medications.  Current  Outpatient Medications  Medication Sig Dispense Refill  . levothyroxine (SYNTHROID, LEVOTHROID) 25 MCG tablet Take 25 mcg by mouth daily before breakfast.    . lamoTRIgine (LAMICTAL) 150 MG tablet Take 1 tablet (150 mg total) by mouth daily. 90 tablet 1  . lithium carbonate 300 MG capsule Take 1 capsule (300 mg total) by mouth daily. 90 capsule 0  . [START ON 10/19/2020] methylphenidate (RITALIN) 20 MG tablet Take 1 tablet (20 mg total) by mouth 3 (three) times daily with meals. 90 tablet 0  . [START ON 09/21/2020] methylphenidate (RITALIN) 20 MG tablet Take 1 tablet (20 mg total) by mouth 3 (three) times daily with meals. 90 tablet 0  . methylphenidate (RITALIN) 20 MG tablet 3/4 tablet four times daily 90 tablet 0   No current facility-administered medications for this visit.    Medication Side Effects: tremor is affecting handwriting and computer worse with 600 mg lithium..  Numbness in toes.  Hands and feet always cold.  Allergies:  Allergies  Allergen Reactions  . Codeine Other (See  Comments)    Abdominal pain   . Latex Hives  . Septra [Sulfamethoxazole-Trimethoprim] Rash    Past Medical History:  Diagnosis Date  . Depression   . Osteopenia 11/2017   T score -1.1 FRAX 24% / 14%  . Tremor, essential   . Vitamin D deficiency 12/2017    Family History  Problem Relation Age of Onset  . Hypertension Mother   . Thyroid disease Mother   . Breast cancer Mother 49  . Breast cancer Daughter 55  . Thyroid disease Daughter   . Other Father        Brain tumor  . Parkinson's disease Father   . Dementia Father   . Cancer Daughter        Melanoma  . Heart disease Paternal Grandfather     Social History   Socioeconomic History  . Marital status: Widowed    Spouse name: Not on file  . Number of children: Not on file  . Years of education: Not on file  . Highest education level: Not on file  Occupational History  . Not on file  Tobacco Use  . Smoking status: Former Smoker     Packs/day: 0.50    Years: 30.00    Pack years: 15.00    Types: Cigarettes    Quit date: 2012    Years since quitting: 9.9  . Smokeless tobacco: Never Used  Vaping Use  . Vaping Use: Never used  Substance and Sexual Activity  . Alcohol use: Yes    Alcohol/week: 0.0 standard drinks    Comment: Occas.  . Drug use: No  . Sexual activity: Never    Birth control/protection: Post-menopausal    Comment: 1st intercourse 79 yo-Fewer than 5 partners  Other Topics Concern  . Not on file  Social History Narrative  . Not on file   Social Determinants of Health   Financial Resource Strain: Not on file  Food Insecurity: Not on file  Transportation Needs: Not on file  Physical Activity: Not on file  Stress: Not on file  Social Connections: Not on file  Intimate Partner Violence: Not on file    Past Medical History, Surgical history, Social history, and Family history were reviewed and updated as appropriate.   Please see review of systems for further details on the patient's review from today.   Objective:   Physical Exam:  There were no vitals taken for this visit.  Physical Exam Constitutional:      General: She is not in acute distress.    Appearance: She is well-developed and normal weight.  Musculoskeletal:        General: No deformity.  Neurological:     Mental Status: She is alert and oriented to person, place, and time.     Cranial Nerves: No dysarthria.     Motor: Tremor present.     Coordination: Coordination normal.  Psychiatric:        Attention and Perception: Perception normal. She is inattentive. She does not perceive auditory or visual hallucinations.        Mood and Affect: Mood is not depressed. Affect is not labile, blunt, angry, tearful or inappropriate.        Speech: Speech normal.        Behavior: Behavior normal. Behavior is cooperative.        Thought Content: Thought content normal. Thought content is not paranoid or delusional. Thought content does  not include homicidal or suicidal ideation. Thought content does not include  homicidal or suicidal plan.        Cognition and Memory: Cognition and memory normal.        Judgment: Judgment normal.     Comments: Good insight Depression better after increase lamotrigine   objectively the patient does not show significant mouth movements but she displayed video at prior visit in which typical mouth movements cheek movements associated with tardive dyskinesia was evident.  Her tremor is rhythmic and is not supportive of a tardive dyskinesia type tremor.  Lab Review:  No results found for: NA, K, CL, CO2, GLUCOSE, BUN, CREATININE, CALCIUM, PROT, ALBUMIN, AST, ALT, ALKPHOS, BILITOT, GFRNONAA, GFRAA  No results found for: WBC, RBC, HGB, HCT, PLT, MCV, MCH, MCHC, RDW, LYMPHSABS, MONOABS, EOSABS, BASOSABS  No results found for: POCLITH, LITHIUM   No results found for: PHENYTOIN, PHENOBARB, VALPROATE, CBMZ   She says lithium level was 0.6 at PCP on 450 mg daily.   07/2020 lithium low therapeutic by her report [facility]  Specifically her lithium level May 17, 2019 on this dosage of lithium was 0.3.  Her urinalysis was normal she had a normal CBC, comprehensive metabolic panel was normal including creatinine 0.83 calcium 9.9 and TSH was normal at 2.83.  Lithium level in April 2019 was 0.2 at this dosage.  We will not increase the lithium because she is currently having tremor at the low dose.  .res Assessment: Plan:    Depression, major, recurrent, in partial remission ([place]) - Plan: methylphenidate (RITALIN) 20 MG tablet, methylphenidate (RITALIN) 20 MG tablet, methylphenidate (RITALIN) 20 MG tablet  Attention deficit hyperactivity disorder (ADHD), predominantly inattentive type - Plan: methylphenidate (RITALIN) 20 MG tablet, methylphenidate (RITALIN) 20 MG tablet, methylphenidate (RITALIN) 20 MG tablet  Tremor, essential  Generalized anxiety disorder  Lithium use  Low serum vitamin  D  Tardive dyskinesia  Major depressive disorder, recurrent episode, moderate ([facility]) - Plan: methylphenidate (RITALIN) 20 MG tablet, lithium carbonate 300 MG capsule  Severe episode of recurrent major depressive disorder, without psychotic features ([place]) - Plan: lamoTRIgine (LAMICTAL) 150 MG tablet   .Greater than 50% of 30-minute face to face time with patient was spent on counseling and coordination of care. We discussed Lillyian has a long history over decades of cycling major depression and treatment resistance to meds noted above of nearly every antidepressant category.  Her best response so far has been from low-dose lithium with a stimulant but lamotrigine has seemed to help for 6 mos in 2020 until relapse in Summer.  The depression resolved again with the increase in lamotrigine from 100 to 150 mg daily.  They do not follow any pattern and do not appear to have any precipitant.   While she does not identify herself is bipolar because her upswings seem only normal to her.  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  We tried an increase lithium to 600 mg daily and check a blood level.   The increase in lithium did not help her mood and her made her tremor worse. Counseled patient regarding potential benefits, risks, and side effects of lithium to include potential risk of lithium affecting thyroid and renal function.  Discussed need for periodic lab monitoring to determine drug level and to assess for potential adverse effects.  Counseled patient regarding signs and  symptoms of lithium toxicity and advised that they notify office immediately or seek urgent medical attention if experiencing these signs and symptoms.  Patient advised to contact office with any questions or  concerns.    The tremor predates lithium and predates stimulant use but appears to have been made worse by the increase in lamotrigine.  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  She plans to see a neurologist.  objectively the patient does not show significant mouth movements but she displayed video since her last visit in which typical mouth movements cheek movements associated with tardive dyskinesia was evident.  Her tremor is rhythmic and is not supportive of a tardive dyskinesia type tremor. History of essential tremor prior to lithium. Alternative primidone. Disc seeing neurologist.  She agrees tomorrow.  At [facility].    The increase lamotrigine to 150 mg daily resolved the depression.  it was helpful for several months at 100. Continue Ritalin as prescribed 3/4 tablet QID.  Prefers [place] generic. She got different brand [place] of methylphenidate and it shatters when tries to quarter it. Continue lithium 300 mg nightly.  So far her mood is been stable with a lower dose  Discussed potential benefits, risks, and side effects of stimulants with patient to include increased heart rate, palpitations, insomnia, increased anxiety, increased irritability, or decreased appetite.  Instructed patient to contact office if experiencing any significant tolerability issues.  Continue  Vitamin D. Disc reasons for doing so for mental and physical health in detail.  She has stopped bc she hates taking pills.  Option trazodone.  She doesn't want to add more meds.  FU 12 weeks  Lynder Parents, MD, DFAPA '   Please see After Visit Summary for patient specific instructions.  Future Appointments  Date Time Provider [place]  08/25/2020  1:00 PM Star Age, MD [facility] None    No orders of the defined types were placed in this encounter.     -------------------------------

## 2020-08-24 NOTE — Telephone Encounter (Signed)
Pt is on waitlist for sooner appt. I called to offer her an appt tomorrow with Dr. Rexene Alberts. No answer, left a message asking her to call us back. If pt calls back please offer her a sooner appt as available with Dr. Rexene Alberts or any other MD.

## 2020-08-24 NOTE — Patient Instructions (Signed)
(  started lithium 150 mg Nov 2018)

## 2020-08-25 ENCOUNTER — Ambulatory Visit (INDEPENDENT_AMBULATORY_CARE_PROVIDER_SITE_OTHER): Payer: Medicare Other | Admitting: Neurology

## 2020-08-25 ENCOUNTER — Telehealth: Payer: Self-pay | Admitting: Neurology

## 2020-08-25 ENCOUNTER — Encounter: Payer: Self-pay | Admitting: Neurology

## 2020-08-25 VITALS — BP 132/73 | HR 73 | Ht 65.0 in | Wt 142.0 lb

## 2020-08-25 DIAGNOSIS — R2689 Other abnormalities of gait and mobility: Secondary | ICD-10-CM | POA: Diagnosis not present

## 2020-08-25 DIAGNOSIS — G251 Drug-induced tremor: Secondary | ICD-10-CM | POA: Diagnosis not present

## 2020-08-25 DIAGNOSIS — G250 Essential tremor: Secondary | ICD-10-CM

## 2020-08-25 NOTE — Telephone Encounter (Signed)
Medicare/aarp order faxed to [facility] for open mri. They will reach out to the patient to schedule.

## 2020-08-25 NOTE — Progress Notes (Signed)
Subjective:    Patient ID: Katrina Gamble is a 79 y.o. female.  HPI     Katrina Age, MD, PhD [facility] [address] [place], [place] [address]  Dear Dr. Clovis Pu,   I saw your patient, Katrina Gamble, upon your kind request, in my Neurologic clinic today for initial consultation of her hand tremor.  The patient is unaccompanied today.  As you know, Katrina Gamble is a 78 year old right-handed woman with an underlying medical history of vitamin D deficiency, osteopenia, depression, anxiety, and ADD, who reports a longstanding history of tremor affecting both upper extremities for years.  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  She reports that it was not effective.  I was able to renew old neurological records in our old medical record system, she saw Dr. Doy Mince on 06/11/2005 and was felt to have a mild case of essential tremor and low-dose propranolol at 10 mg strength up to 2 tablets as needed was recommended at the time.  She was seen in follow-up by the nurse practitioner on 10/03/2005, at which time she reported that her tremor was much better since starting the Enbrel and that her head bobbing was completely gone.  I reviewed your office note from 05/25/2020, as well as 08/24/2020.  Of note, she is on multiple medications including Lamictal, Synthroid, lithium, vitamin.  In the past, she has been on other medications including Abilify, Mellaril, Nardil, Prozac, Wellbutrin, Vyvanse, propranolol.  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  She had a head CT without contrast on 06/27/2015 which showed: Impression: No acute intracranial pathology.  She reports that her tremor improved over time.  She is no longer on propranolol.  She does not have a family history of essential tremor but her father had Parkinson's disease.  She has not fallen  recently but has had over time some balance problems, feels like she veers to one side, typically the left side while walking.  She denies any orthostatic lightheadedness or vertigo symptoms. She quit smoking in 2012, drinks alcohol in the form of wine, 2 4 ounce glasses 5 times a week.  She drinks caffeine in the form of regular coffee, 1 or 2 cups/day and 1 or 2 cups of decaf coffee per day, admits that she does not always hydrate well with water.  She is widowed and lives alone.  She has 3 grown children.  She feels that her handwriting has deteriorated over time.  She did fill out our new patient paperwork and has been able to print with very legible handwriting, no micrographia noted, but fairly consistent trembling.  She reports that it took her hours to write it. She retired as a Estate manager/land agent.  She reports that she has been on lithium for years, probably since 2019.  When it was increased to 600 mg daily she noticed that significant increase in her tremor.  Lithium was reduced to 450 mg daily and then to 300 mg daily which she is on currently.   Her Past Medical History Is Significant For: Past Medical History:  Diagnosis Date  . ADD (attention deficit disorder)   . Depression   . Diverticulitis   . Hypothyroid    d/t lithium  . Osteopenia 11/2017   T score -1.1 FRAX 24% / 14%  . Peripheral neuropathy   . Tremor, essential   . Vitamin D deficiency 12/2017  Her Past Surgical History Is Significant For: Past Surgical History:  Procedure Laterality Date  . APPENDECTOMY  1960  . CATARACT EXTRACTION, BILATERAL  2021  . CHOLECYSTECTOMY  1985  . TONSILLECTOMY  1949    Her Family History Is Significant For: Family History  Problem Relation Gamble of Onset  . Hypertension Mother   . Thyroid disease Mother   . Breast cancer Mother 20  . Breast cancer Daughter 62  . Thyroid disease Daughter   . Other Father        Brain tumor  . Parkinson's disease Father   . Dementia  Father   . Pneumonia Father   . Cancer Daughter        Melanoma  . Heart disease Paternal Grandfather   . Leukemia Maternal Grandmother   . Stomach cancer Paternal Grandmother     Her Social History Is Significant For: Social History   Socioeconomic History  . Marital status: Widowed    Spouse name: Not on file  . Number of children: 3  . Years of education: Not on file  . Highest education level: Bachelor's degree (e.g., BA, AB, BS)  Occupational History    Comment: retired  Tobacco Use  . Smoking status: Former Smoker    Packs/day: 0.50    Years: 30.00    Pack years: 15.00    Types: Cigarettes    Quit date: 2012    Years since quitting: 9.9  . Smokeless tobacco: Never Used  Vaping Use  . Vaping Use: Never used  Substance and Sexual Activity  . Alcohol use: Yes    Alcohol/week: 0.0 standard drinks    Comment: 08/25/20 2 4oz glasses wine 5 x week  . Drug use: Never  . Sexual activity: Never    Birth control/protection: Post-menopausal    Comment: 1st intercourse 79 yo-Fewer than 5 partners  Other Topics Concern  . Not on file  Social History Narrative   Lives alone   Caffeine- 1 c coffee   Social Determinants of Health   Financial Resource Strain: Not on file  Food Insecurity: Not on file  Transportation Needs: Not on file  Physical Activity: Not on file  Stress: Not on file  Social Connections: Not on file    Her Allergies Are:  Allergies  Allergen Reactions  . Codeine Other (See Comments)    Abdominal pain   . Latex Hives  . Septra [Sulfamethoxazole-Trimethoprim] Rash  . Sulfa Antibiotics Rash and Other (See Comments)  :   Her Current Medications Are:  Outpatient Encounter Medications as of 08/25/2020  Medication Sig  . lamoTRIgine (LAMICTAL) 150 MG tablet Take 1 tablet (150 mg total) by mouth daily.  Marland Kitchen levothyroxine (SYNTHROID, LEVOTHROID) 25 MCG tablet Take 25 mcg by mouth daily before breakfast.  . lithium carbonate 300 MG capsule Take 1  capsule (300 mg total) by mouth daily.  Katrina Gamble ON 10/19/2020] methylphenidate (RITALIN) 20 MG tablet Take 1 tablet (20 mg total) by mouth 3 (three) times daily with meals.  Katrina Gamble ON 09/21/2020] methylphenidate (RITALIN) 20 MG tablet Take 1 tablet (20 mg total) by mouth 3 (three) times daily with meals. (Patient not taking: Reported on 08/25/2020)  . methylphenidate (RITALIN) 20 MG tablet 3/4 tablet four times daily (Patient not taking: Reported on 08/25/2020)   No facility-administered encounter medications on file as of 08/25/2020.  : Review of Systems:  Out of a complete 14 point review of systems, all are reviewed and negative with the exception of  these symptoms as listed below:  Review of Systems  Neurological:       Rm 1 New Pt here for evaluation of tremor. She was seen by Dr Doy Mince for tremor in 2006.    Objective:  Neurological Exam  Physical Exam Physical Examination:   Vitals:   08/25/20 1239  BP: 132/73  Pulse: 73    General Examination: The patient is a very pleasant 79 y.o. female in no acute distress. She appears well-developed and well-nourished and well groomed.   HEENT: Normocephalic, atraumatic, pupils are equal, round and reactive to light, extraocular tracking is well preserved, no obvious nystagmus noted, hearing is grossly intact.  Face is symmetric with normal facial animation and normal sensation to vibration and light touch and temperature.  Neck is supple with full range of motion.  No obvious head tremor.  She has no dysarthria, no voice tremor.  Airway examination reveals moderate mouth dryness, tongue protrudes centrally and palate elevates symmetrically, no orofacial or buccolingual dyskinesias noted.   Chest: Clear to auscultation without wheezing, rhonchi or crackles noted.  Heart: S1+S2+0, regular and normal without murmurs, rubs or gallops noted.   Abdomen: Soft, non-tender and non-distended with normal bowel sounds appreciated on  auscultation.  Extremities: There is no pitting edema in the distal lower extremities bilaterally. Pedal pulses are intact.  Skin: Warm and dry without trophic changes noted.  Musculoskeletal: exam reveals no obvious joint deformities, tenderness or joint swelling or erythema.   Neurologically:  Mental status: The patient is awake, alert and oriented in all 4 spheres. Her immediate and remote memory, attention, language skills and fund of knowledge are appropriate. There is no evidence of aphasia, agnosia, apraxia or anomia. Speech is clear with normal prosody and enunciation. Thought process is linear. Mood is normal and affect is normal.  Cranial nerves II - XII are as described above under HEENT exam. In addition: shoulder shrug is normal with equal shoulder height noted.  She has no resting tremor, no bilateral upper extremity postural tremor or action tremor, no significant intention tremor.  She has normal lower extremity tremors. Handwriting sample from 2006 which she brought for comparison showed severe trembling and nearly illegible handwriting.  Handwriting today shows no micrographia, moderate trembling, she holds the pen between digit 2 and 3 rather than between the thumb and the index finger.  She feels that her pain is more stable that way.  Motor exam: Normal bulk, strength and tone is noted. There is no drift, tremor or rebound. Romberg is negative. Reflexes are 2+ throughout. Babinski: Toes are flexor bilaterally. Fine motor skills and coordination: intact with normal finger taps, normal hand movements, normal rapid alternating patting, normal foot taps and normal foot agility.  Cerebellar testing: No dysmetria or intention tremor on finger to nose testing. Heel to shin is unremarkable on the right and slight difficulty with the left is noted. There is no truncal or gait ataxia.  Sensory exam: intact to light touch, vibration, temperature sense in the upper and lower extremities.   Gait, station and balance: She stands easily. No veering to one side is noted. No leaning to one side is noted. Posture is Gamble-appropriate and stance is narrow based. Gait shows normal stride length and normal pace. No problems turning are noted. Tandem walk is challenging, does get a little better with the second attempt.    Assessment and Plan:   In summary, Landen A Dorman is a very pleasant 79 y.o.-year old female with an  underlying medical history of vitamin D deficiency, osteopenia, depression, anxiety, and ADD, who presents for evaluation of her hand tremors.  She does not have a significant hand tremor today, trembling is noted with her handwriting primarily.  She reports being on propranolol in the past.  She feels that it did not help as well as she had hoped.  She is currently not on it.  She is is wondering if she should try it again.  Given her history of depression, I would not recommend a high dose of propranolol.  If you feel that it is safe to try and closely monitor for her depression exacerbation, she is advised to talk to you about potentially trying propranolol at a higher dose.  For now, she does not have a significant hand tremor, if she has essential tremor it is rather mild.  She does report that she improved over time.  She has not had a brain MRI.  Given that she also complains of some balance problems, I would recommend a brain MRI with and without contrast.  She also reports that she had an abnormality with her thyroid function, which is monitored by her primary care physician and she is due for checkup later this month.  We talked about tremor triggers including caffeine, sleep deprivation, under hydration, stress, thyroid dysfunction and certain medications which includes lithium.  It is possible that her tremor is exacerbated by lithium.  Currently, her exam does not show any significant tremor and she is largely reassured.  In addition, she does not have any parkinsonism.   Nevertheless, I would like to rule out a structural cause of her recent tremor exacerbation.  We will call her to schedule her brain MRI and also call with the results.  We do utilize primidone for symptomatic treatment of hand tremors or essential tremor often.  Given her complaint of balance issues, I would favor not to use this medication for fear of exacerbation of balance problems.  At this juncture, we will keep her posted as to her brain scan result by phone call.  She is reporting claustrophobia and I ordered an open MRI.  I suggested follow-up in this clinic as needed. I answered all her questions today and she was in agreement.  Thank you very much for allowing me to participate in the care of this nice patient. If I can be of any further assistance to you please do not hesitate to call me at [phone].  Sincerely,   Katrina Age, MD, PhD

## 2020-08-25 NOTE — Telephone Encounter (Signed)
January 4th 915am

## 2020-08-25 NOTE — Patient Instructions (Addendum)
You have a rather mild and intermittent tremor of both hands.   I do not see any signs or symptoms of parkinson's like disease or what we call parkinsonism.   For your tremor, I would not recommend any new medication for fear of side effects (especially sleepiness) or medication interactions, especially in light of your complaint of balance problems.  You have already tried propranolol in the past.  I am not sure if you would be a good candidate for a higher dose as it can drop the blood pressure and also exacerbate depression in certain patients.  If Dr. Clovis Pu is comfortable trying you on a higher dose of propranolol and monitor your depression symptoms closely, you may want to discuss symptomatic treatment of your tremor with a beta-blocker with him.   Please remember, that any kind of tremor may be exacerbated by anxiety, anger, nervousness, excitement, dehydration, sleep deprivation, by caffeine, and low blood sugar values or blood sugar fluctuations and thyroid disease. Some medications can exacerbate tremors, this includes lithium, as we discussed please keep your follow-up appointment with your primary care for checkup on your thyroid function.  We will do a brain scan, called MRI and call you with the test results. We will have to schedule you for this on a separate date. This test requires authorization from your insurance, and we will take care of the insurance process. We will schedule you for an open MRI which is not is enclosed feeling.

## 2020-08-26 LAB — COMPREHENSIVE METABOLIC PANEL
ALT: 16 IU/L (ref 0–32)
AST: 22 IU/L (ref 0–40)
Albumin/Globulin Ratio: 1.8 (ref 1.2–2.2)
Albumin: 4.3 g/dL (ref 3.7–4.7)
Alkaline Phosphatase: 69 IU/L (ref 44–121)
BUN/Creatinine Ratio: 12 (ref 12–28)
BUN: 11 mg/dL (ref 8–27)
Bilirubin Total: 0.5 mg/dL (ref 0.0–1.2)
CO2: 22 mmol/L (ref 20–29)
Calcium: 9.7 mg/dL (ref 8.7–10.3)
Chloride: 103 mmol/L (ref 96–106)
Creatinine, Ser: 0.93 mg/dL (ref 0.57–1.00)
GFR calc Af Amer: 69 mL/min/{1.73_m2} (ref >59)
GFR calc non Af Amer: 59 mL/min/{1.73_m2} — ABNORMAL LOW (ref >59)
Globulin, Total: 2.4 g/dL (ref 1.5–4.5)
Glucose: 85 mg/dL (ref 65–99)
Potassium: 4.3 mmol/L (ref 3.5–5.2)
Sodium: 139 mmol/L (ref 134–144)
Total Protein: 6.7 g/dL (ref 6.0–8.5)

## 2020-09-05 DIAGNOSIS — F339 Major depressive disorder, recurrent, unspecified: Secondary | ICD-10-CM | POA: Diagnosis not present

## 2020-09-05 DIAGNOSIS — M8580 Other specified disorders of bone density and structure, unspecified site: Secondary | ICD-10-CM | POA: Diagnosis not present

## 2020-09-05 DIAGNOSIS — Z872 Personal history of diseases of the skin and subcutaneous tissue: Secondary | ICD-10-CM | POA: Diagnosis not present

## 2020-09-05 DIAGNOSIS — Z0000 Encounter for general adult medical examination without abnormal findings: Secondary | ICD-10-CM | POA: Diagnosis not present

## 2020-09-05 DIAGNOSIS — E559 Vitamin D deficiency, unspecified: Secondary | ICD-10-CM | POA: Diagnosis not present

## 2020-09-05 DIAGNOSIS — E039 Hypothyroidism, unspecified: Secondary | ICD-10-CM | POA: Diagnosis not present

## 2020-09-05 DIAGNOSIS — Z79899 Other long term (current) drug therapy: Secondary | ICD-10-CM | POA: Diagnosis not present

## 2020-09-05 DIAGNOSIS — R899 Unspecified abnormal finding in specimens from other organs, systems and tissues: Secondary | ICD-10-CM | POA: Diagnosis not present

## 2020-09-05 DIAGNOSIS — Z13220 Encounter for screening for lipoid disorders: Secondary | ICD-10-CM | POA: Diagnosis not present

## 2020-09-05 DIAGNOSIS — J069 Acute upper respiratory infection, unspecified: Secondary | ICD-10-CM | POA: Diagnosis not present

## 2020-09-13 DIAGNOSIS — R2689 Other abnormalities of gait and mobility: Secondary | ICD-10-CM | POA: Diagnosis not present

## 2020-09-13 DIAGNOSIS — R251 Tremor, unspecified: Secondary | ICD-10-CM | POA: Diagnosis not present

## 2020-09-14 ENCOUNTER — Telehealth: Payer: Self-pay

## 2020-09-14 NOTE — Telephone Encounter (Signed)
I received her brain MRI report.  She had a brain MRI with and without contrast on 09/13/2020.  Study was done at [facility].  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  Otherwise normal.  Please call patient and advise her that her brain MRI showed no acute findings, chronic findings were seen, no mass or abnormal contrast uptake.  No obvious old strokes.   As discussed, she can follow-up in our clinic as needed.

## 2020-09-14 NOTE — Telephone Encounter (Signed)
I called pt. I discussed her MRI results and recommendations. Pt will follow up with [place] PRN. Pt verbalized understanding of results. Pt had no questions at this time but was encouraged to call back if questions arise.

## 2020-09-14 NOTE — Telephone Encounter (Signed)
Received MRI results for pt from [facility].

## 2020-09-20 ENCOUNTER — Encounter: Payer: Self-pay | Admitting: Neurology

## 2020-10-20 ENCOUNTER — Ambulatory Visit: Payer: Medicare Other | Admitting: Neurology

## 2020-11-03 IMAGING — MG DIGITAL SCREENING BILAT W/ TOMO W/ CAD
7 series · 8 of 19 positions shown · non-contrast
Comparison: Previous exam(s).

CLINICAL DATA: Screening.

EXAM:
DIGITAL SCREENING BILATERAL MAMMOGRAM WITH TOMO AND CAD

[R CC synth-2D]
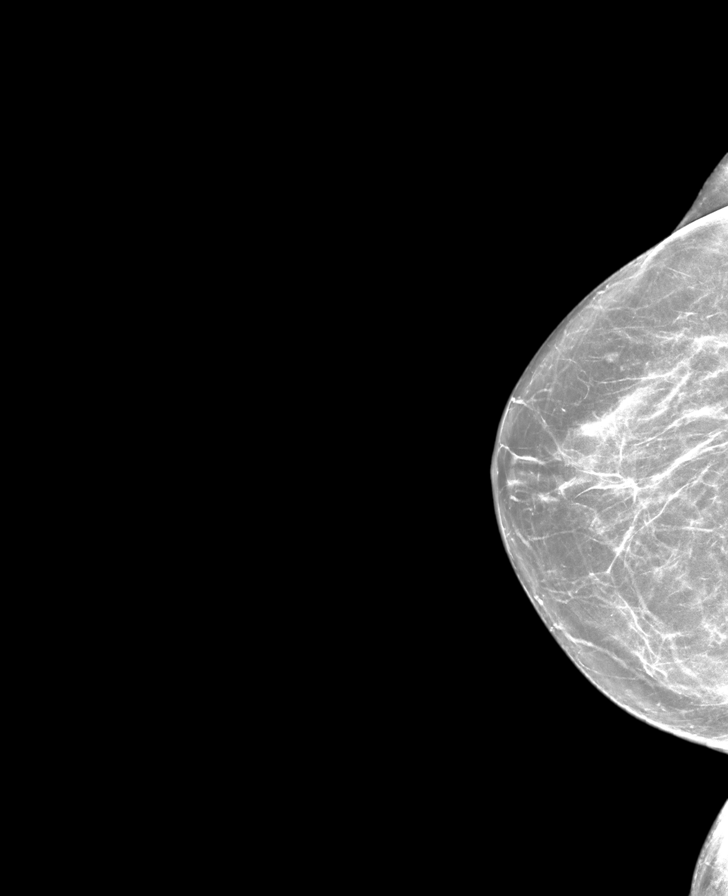

[R MLO synth-2D]
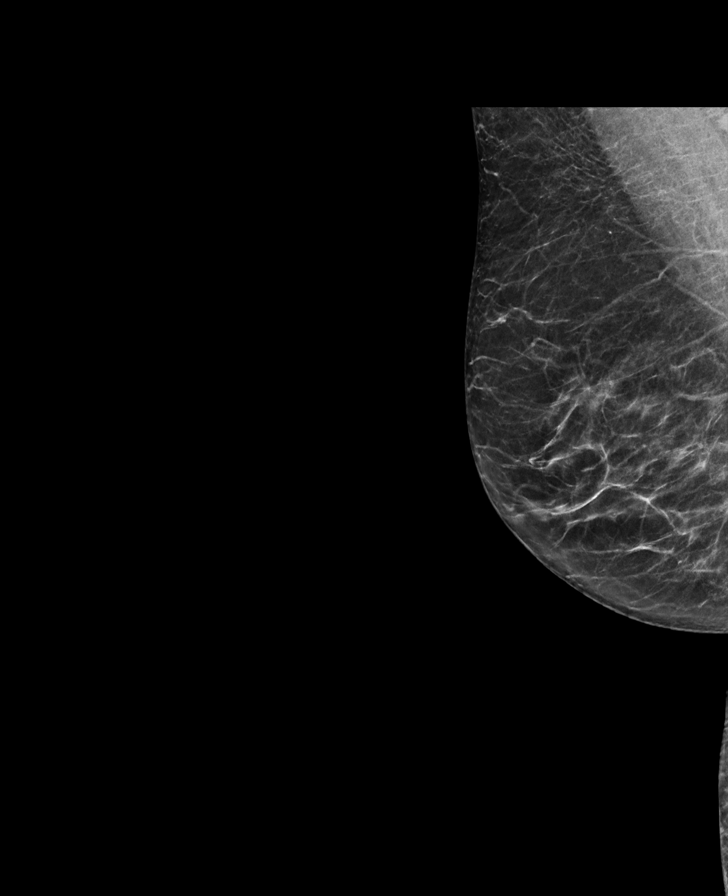

[L MLO synth-2D]
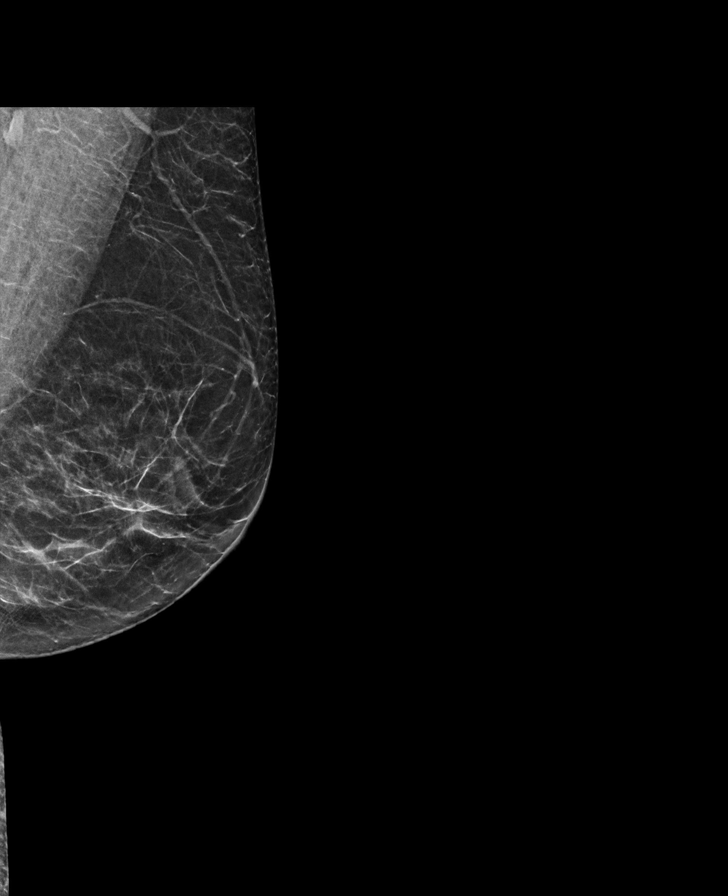

[L CC synth-2D]
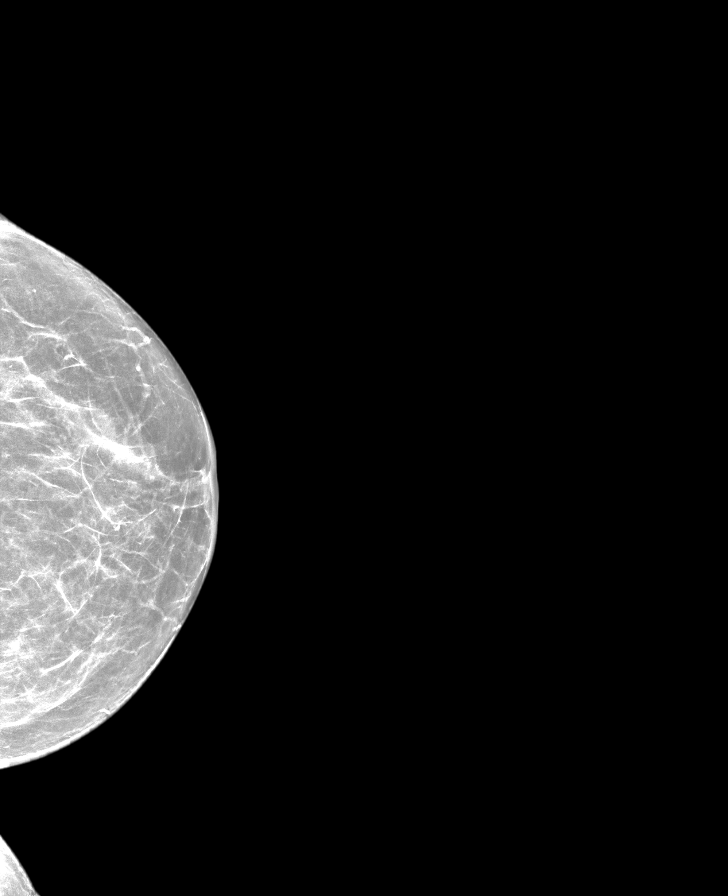

[R MLO tomo · 2 of 64 frames shown]
[frame 21/64]
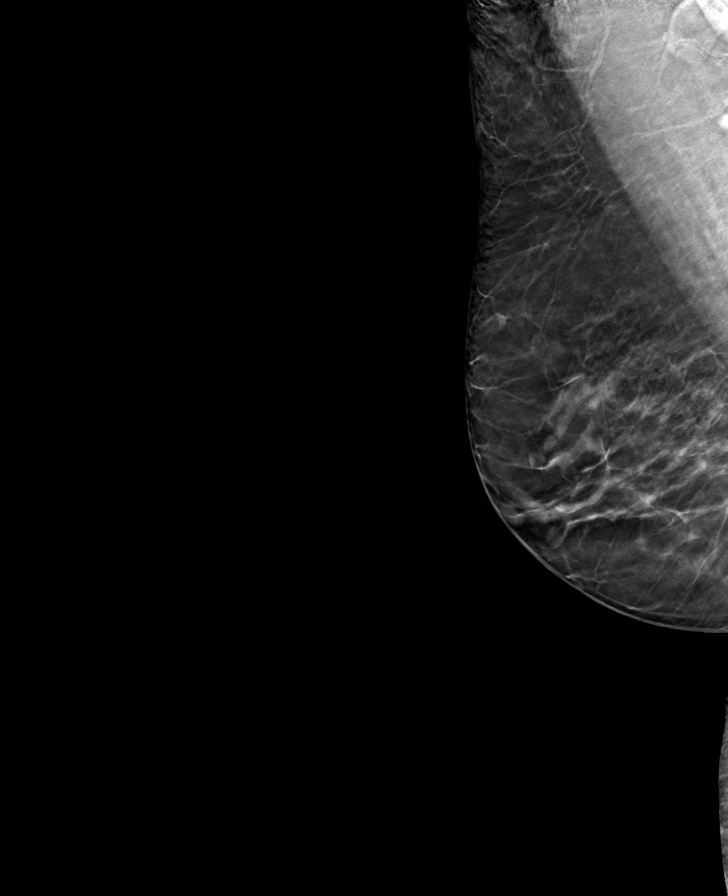
[frame 33/64]
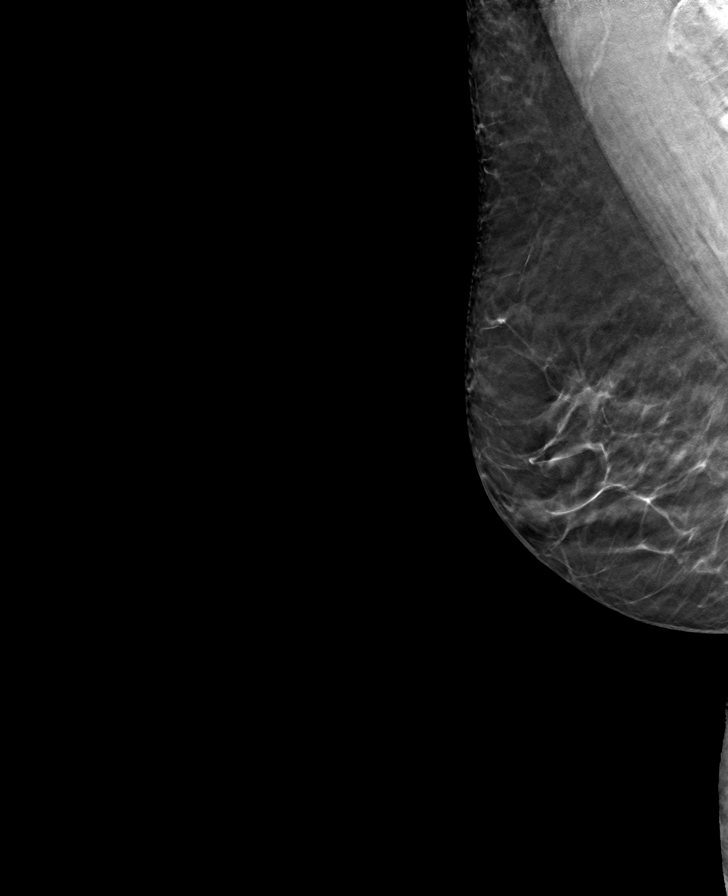

[L MLO tomo · tomo slice 32/63.0]
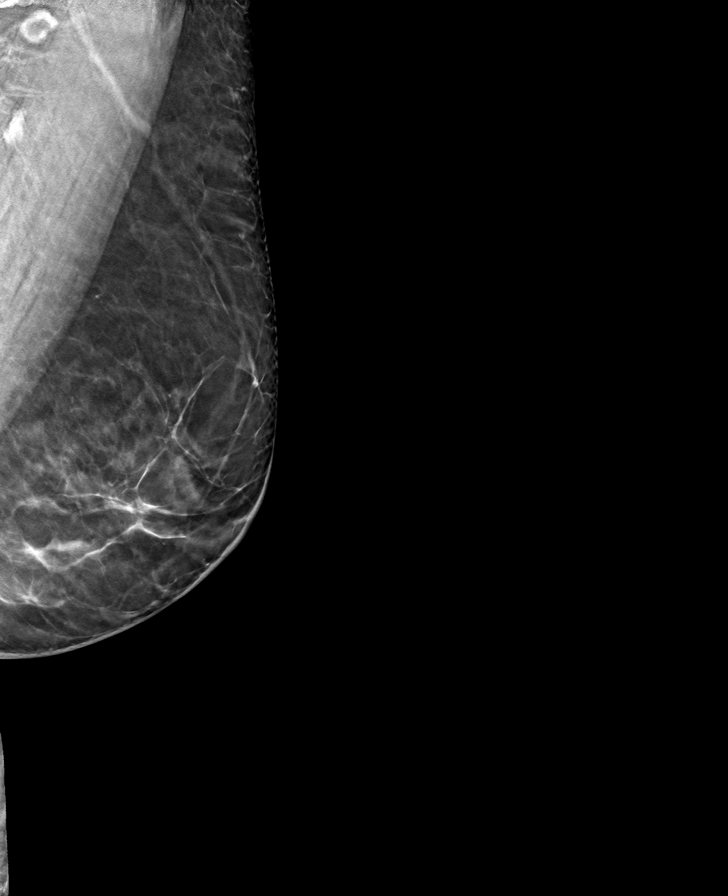

[R CC tomo · tomo slice 35/69.0]
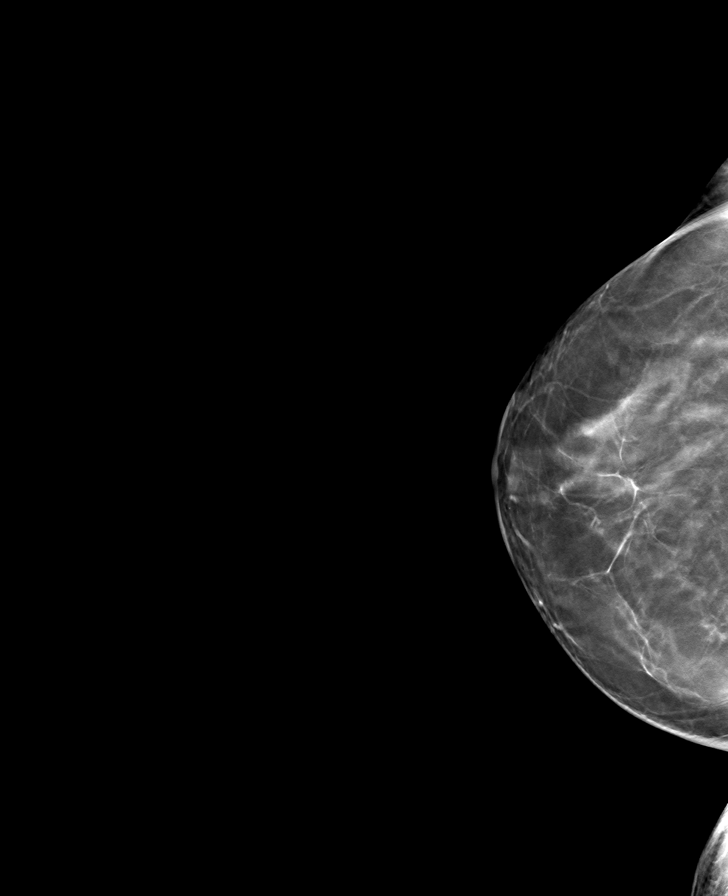

[8 of 19 positions shown; findings below may reference images not displayed]

ACR Breast Density Category b: There are scattered areas of
fibroglandular density.
FINDINGS: There are no findings suspicious for malignancy. Images were
processed with CAD.
IMPRESSION: No mammographic evidence of malignancy. A result letter of this
screening mammogram will be mailed directly to the patient.

RECOMMENDATION:
Screening mammogram in one year. (Code:[id number])

BI-RADS CATEGORY  1: Negative.

## 2020-11-22 ENCOUNTER — Other Ambulatory Visit: Payer: Self-pay

## 2020-11-22 ENCOUNTER — Encounter: Payer: Self-pay | Admitting: Psychiatry

## 2020-11-22 ENCOUNTER — Ambulatory Visit (INDEPENDENT_AMBULATORY_CARE_PROVIDER_SITE_OTHER): Payer: Medicare Other | Admitting: Psychiatry

## 2020-11-22 VITALS — BP 141/88 | HR 80

## 2020-11-22 DIAGNOSIS — Z79899 Other long term (current) drug therapy: Secondary | ICD-10-CM | POA: Diagnosis not present

## 2020-11-22 DIAGNOSIS — F3341 Major depressive disorder, recurrent, in partial remission: Secondary | ICD-10-CM

## 2020-11-22 DIAGNOSIS — G250 Essential tremor: Secondary | ICD-10-CM | POA: Diagnosis not present

## 2020-11-22 DIAGNOSIS — G251 Drug-induced tremor: Secondary | ICD-10-CM | POA: Diagnosis not present

## 2020-11-22 DIAGNOSIS — F900 Attention-deficit hyperactivity disorder, predominantly inattentive type: Secondary | ICD-10-CM

## 2020-11-22 DIAGNOSIS — F331 Major depressive disorder, recurrent, moderate: Secondary | ICD-10-CM

## 2020-11-22 DIAGNOSIS — F411 Generalized anxiety disorder: Secondary | ICD-10-CM

## 2020-11-22 MED ORDER — METHYLPHENIDATE HCL 20 MG PO TABS: 20.0000 mg | ORAL_TABLET | Freq: Three times a day (TID) | ORAL | 0 refills | Status: DC

## 2020-11-22 MED ORDER — PROPRANOLOL HCL 20 MG PO TABS: 60.0000 mg | ORAL_TABLET | Freq: Two times a day (BID) | ORAL | 1 refills | Status: DC

## 2020-11-22 MED ORDER — METHYLPHENIDATE HCL 20 MG PO TABS: ORAL_TABLET | ORAL | 0 refills | Status: DC

## 2020-11-22 MED ORDER — LITHIUM CARBONATE 300 MG PO CAPS: 300.0000 mg | ORAL_CAPSULE | Freq: Every day | ORAL | 3 refills | Status: DC

## 2020-11-22 NOTE — Patient Instructions (Signed)
Retry propranolol per her request 40 mg twice daily for a few days and if no benefit then increase to 50 mg twice daily for a few days. Can increase every few days by 10 mg to a max of 80 mg twice daily as long as there are no side effects and you monitor blood pressure and pulse without significant changes or problems.

## 2020-11-22 NOTE — Progress Notes (Signed)
Katrina Gamble [id number] July 10, 1941 80 y.o.     Subjective:   Patient ID:  Katrina Gamble is a 80 y.o. (DOB 02-27-1941) female.  Chief Complaint:  Chief Complaint  Patient presents with  . Follow-up  . Depression, major, recurrent, in partial remission ([place])  . Other    tremor    Depression        Associated symptoms include no decreased concentration, no fatigue and no suicidal ideas.  Past medical history includes anxiety.   Anxiety Patient reports no chest pain, confusion, decreased concentration, nervous/anxious behavior, palpitations or suicidal ideas.    Medication Refill Pertinent negatives include no abdominal pain, chest pain, fatigue or weakness.   Katrina Gamble presents today for follow-up of ADD and mood.  When t seen May 15, 2019.  She continued to have cycles of depression without clear precipitant.  It was suggested that she increase lithium to 450 mg daily check a lithium level.  seen July 15, 2019.  She was having more depression.  The following changes were made. Rec increase lithium to 600 mg daily for a week and check blood level. If this fails to help or is intolerable then recommend lamotrigine trial as noted. She continued Ritalin 20 mg 3 times daily per usual  No lithium level nor BMP were received.  seen August 20 2019.  She was still dealing with depression and had not seen any improvement in depression at 600 mg versus 450 mg daily.  She did not get a blood level. Reduce lithium to 450 mg daily DT NR at 600 daily  recommend lamotrigine trial as noted.  She called back December 30 stating she had been on lamotrigine for 2 weeks and was noticing a tremor and wondered if it was related.  BC of tremor she went down again from 450 mg in lithium and had reduced it to 300 mg instead of 450 mg.  seen October 15, 2019.  The following was noted: CO  Worsening tremor intentional affecting mouse use about December 30.  Wonders  about essential tremor vs TD.   Thyroid checked a couple months ago and dose was not changed.  Tremor in head interfered with dental procedures some.  Alcohol does not help tremor and  Never has.   Essential tremor dx decades ago. Mood is better with lamotrigine 100 mg daily.  Mood more level.   Plan: No meds were changed.  She continued the following: Continue lamotrigine 100 mg daily as it was helpful. Continue Ritalin as prescribed. Continue lithium 300 mg nightly.  So far her mood is been stable with a lower dose Not using much propranolol.  01/12/2020 appointment, the following is noted: Questions about the propranolol.  Still has sig tremor that varies in intensity from mild to severe.  Not always bothered by it unless doing things with fine motor control like writing.  Has taken up to 20 mg propranolol and not more bc ? GI SE.   Mood still good.  Dep controlled.  Anxiety hard to tell bc Katrina Gamble with recurrent breast CA. Plan no meds changed.  04/13/2020 appointment with the following noted: Propranolol with limited effect on tremor even up to 50 mg daily.  Hard time writing. Worse with guilty, anxious depressed thoughts.  Neg self talk.  Not sure if anxiety triggered neg thinking.  No change stress or meds.  Sleep is disjointed.  Plan: Increase lamotrigine to 150 mg daily as it was helpful for several months at  100. Continue Ritalin as prescribed. Continue lithium 300 mg nightly.   05/25/2020 appointment with the following noted: After increased lamotrigine to mood overall has been very good with mood.  Bad part is tremor has gotten much worse with intention.  Wonders about neurologist. No med changes  08/24/2020 appointment with the following noted:  Disc lithium and kidney function and questions about CKD 3. Missed neuro appt bc got sick right before it.  RS for FEB.  But opening for tomorrow.  Tremor is increasing . Insomnia is worse.  Trouble going to sleep for a couple of hours.  3  nights per week. Racing thoughts all over the place.  Some are regretful thoughts from the past but random things.  Don't have the thoughts in the day. No naps. Cut caffeine max 2 cups coffee as late as 11 AM. Ritalin as late as 630 pm and usually that won't bother sleep.  Kids think her ADD meds aren't working well.  Scattered and hard to stay on task.   Not hyper spending or otherwise hyper.  Always tendency to talk if someone to listen. Can internally feel tense.  Not irritable. Doesn't want more meds. Pretty good with depression. Plan: The increase lamotrigine to 150 mg daily resolved the depression.  it was helpful for several months at 100. Continue Ritalin as prescribed 3/4 tablet QID.  Prefers Malincroft generic. She got different brand Accord of methylphenidate and it shatters when tries to quarter it. Continue lithium 300 mg nightly.  Agree with her plans to see neurology.  11/22/2020 appointment with following noted: Neurology diagnosed essential tremor with possible exacerbation by lithium and anxiety and suggested at the moment to withhold treatment for fear of worsening her balance and because the tremor was rather mild on exam.  MRI showed age-appropriate moderate chronic white matter disease. Pt doesn't think her tremor is mild as did neuro.  Worse with writing.  Disc neuro consult.  Disc propranolol dosing and risk of depression at higher dose.  Tremor noticeable in AM before coffee and affects writing and typing.  Depression is under control.  Anxiety a little worse and experienced physically.  If get anxious then it builds on itself. Denies appetite disturbance.  Patient reports that energy and motivation have been good.  Patient denies any difficulty with concentration. Patient denies any suicidal ideation.   Consistent with meds.  Holter monitor occ SVT and rare VT but not requiring treatment  H died 4 years ago  Youngest Katrina Gamble relapse breast CA.  Stressful.  New great  grand Katrina Gamble [place].  Psych med history:  Abilify no response,  Mellaril,  Nardil, Prozac with side effects, try cyclic antidepressants, Viibryd, Trintellix caused anxiety,  Wellbutrin which caused tremors, imipramine, Pristiq 100,  Vyvanse, Cerefolin NAC, Ritalin, history of Dexedrine during pregnancy which caused weight gain,   lithium 600 tremor worse (started lithium 150 mg Nov 2018) Lamotrigine 150 Propranolol 50 without helpf for tremor  Review of Systems:  Review of Systems  Constitutional: Negative for fatigue.  Cardiovascular: Negative for chest pain and palpitations.  Gastrointestinal: Negative for abdominal pain.  Neurological: Positive for tremors. Negative for weakness.       Tremor worse at lithium 600 mg daily versus lithium 450 mg daily  Psychiatric/Behavioral: Positive for depression, dysphoric mood and sleep disturbance. Negative for agitation, behavioral problems, confusion, decreased concentration, hallucinations, self-injury and suicidal ideas. The patient is not nervous/anxious and is not hyperactive.     Medications: I have reviewed the  patient's current medications.  Current Outpatient Medications  Medication Sig Dispense Refill  . lamoTRIgine (LAMICTAL) 150 MG tablet Take 1 tablet (150 mg total) by mouth daily. 90 tablet 1  . levothyroxine (SYNTHROID, LEVOTHROID) 25 MCG tablet Take 25 mcg by mouth daily before breakfast.    . propranolol (INDERAL) 20 MG tablet Take 3 tablets (60 mg total) by mouth 2 (two) times daily. 180 tablet 1  . lithium carbonate 300 MG capsule Take 1 capsule (300 mg total) by mouth daily. 90 capsule 3  . [START ON 01/17/2021] methylphenidate (RITALIN) 20 MG tablet Take 1 tablet (20 mg total) by mouth 3 (three) times daily with meals. 90 tablet 0  . [START ON 12/20/2020] methylphenidate (RITALIN) 20 MG tablet Take 1 tablet (20 mg total) by mouth 3 (three) times daily with meals. 90 tablet 0  . methylphenidate (RITALIN) 20 MG tablet 3/4 tablet four  times daily 90 tablet 0   No current facility-administered medications for this visit.    Medication Side Effects: tremor is affecting handwriting and computer worse with 600 mg lithium..  Numbness in toes.  Hands and feet always cold.  Allergies:  Allergies  Allergen Reactions  . Codeine Other (See Comments)    Abdominal pain   . Latex Hives  . Septra [Sulfamethoxazole-Trimethoprim] Rash  . Sulfa Antibiotics Rash and Other (See Comments)    Past Medical History:  Diagnosis Date  . ADD (attention deficit disorder)   . Depression   . Diverticulitis   . Hypothyroid    Katrina Gamble/t lithium  . Osteopenia 11/2017   T score -1.1 FRAX 24% / 14%  . Peripheral neuropathy   . Tremor, essential   . Vitamin Katrina Gamble deficiency 12/2017    Family History  Problem Relation Age of Onset  . Hypertension Mother   . Thyroid disease Mother   . Breast cancer Mother 45  . Breast cancer Daughter 46  . Thyroid disease Daughter   . Other Father        Brain tumor  . Parkinson's disease Father   . Dementia Father   . Pneumonia Father   . Cancer Daughter        Melanoma  . Heart disease Paternal Grandfather   . Leukemia Maternal Grandmother   . Stomach cancer Paternal Grandmother     Social History   Socioeconomic History  . Marital status: Widowed    Spouse name: Not on file  . Number of children: 3  . Years of education: Not on file  . Highest education level: Bachelor's degree (e.g., BA, AB, BS)  Occupational History    Comment: retired  Tobacco Use  . Smoking status: Former Smoker    Packs/day: 0.50    Years: 30.00    Pack years: 15.00    Types: Cigarettes    Quit date: 2012    Years since quitting: 10.2  . Smokeless tobacco: Never Used  Vaping Use  . Vaping Use: Never used  Substance and Sexual Activity  . Alcohol use: Yes    Alcohol/week: 0.0 standard drinks    Comment: 08/25/20 2 4oz glasses wine 5 x week  . Drug use: Never  . Sexual activity: Never    Birth  control/protection: Post-menopausal    Comment: 1st intercourse 80 yo-Fewer than 5 partners  Other Topics Concern  . Not on file  Social History Narrative   Lives alone   Caffeine- 1 c coffee   Social Determinants of Health   Financial Resource Strain: Not  on file  Food Insecurity: Not on file  Transportation Needs: Not on file  Physical Activity: Not on file  Stress: Not on file  Social Connections: Not on file  Intimate Partner Violence: Not on file    Past Medical History, Surgical history, Social history, and Family history were reviewed and updated as appropriate.   Please see review of systems for further details on the patient's review from today.   Objective:   Physical Exam:  BP (!) 141/88   Pulse 80   Physical Exam Constitutional:      General: She is not in acute distress.    Appearance: She is well-developed and normal weight.  Musculoskeletal:        General: No deformity.  Neurological:     Mental Status: She is alert and oriented to person, place, and time.     Cranial Nerves: No dysarthria.     Motor: Tremor present.     Coordination: Coordination normal.  Psychiatric:        Attention and Perception: Perception normal. She is inattentive. She does not perceive auditory or visual hallucinations.        Mood and Affect: Mood is not depressed. Affect is not labile, blunt, angry, tearful or inappropriate.        Speech: Speech normal.        Behavior: Behavior normal. Behavior is cooperative.        Thought Content: Thought content normal. Thought content is not paranoid or delusional. Thought content does not include homicidal or suicidal ideation. Thought content does not include homicidal or suicidal plan.        Cognition and Memory: Cognition and memory normal.        Judgment: Judgment normal.     Comments: Good insight Depression better after increase lamotrigine   objectively the patient does not show significant mouth movements but she  displayed video at prior visit in which typical mouth movements cheek movements associated with tardive dyskinesia was evident.  Her tremor is rhythmic and is not supportive of a tardive dyskinesia type tremor.  Lab Review:     Component Value Date/Time   NA 139 08/25/2020 1410   K 4.3 08/25/2020 1410   CL 103 08/25/2020 1410   CO2 22 08/25/2020 1410   GLUCOSE 85 08/25/2020 1410   BUN 11 08/25/2020 1410   CREATININE 0.93 08/25/2020 1410   CALCIUM 9.7 08/25/2020 1410   PROT 6.7 08/25/2020 1410   ALBUMIN 4.3 08/25/2020 1410   AST 22 08/25/2020 1410   ALT 16 08/25/2020 1410   ALKPHOS 69 08/25/2020 1410   BILITOT 0.5 08/25/2020 1410   GFRNONAA 59 (L) 08/25/2020 1410   GFRAA 69 08/25/2020 1410    No results found for: WBC, RBC, HGB, HCT, PLT, MCV, MCH, MCHC, RDW, LYMPHSABS, MONOABS, EOSABS, BASOSABS  No results found for: POCLITH, LITHIUM   No results found for: PHENYTOIN, PHENOBARB, VALPROATE, CBMZ   She says lithium level was 0.6 at PCP on 450 mg daily.   07/2020 lithium low therapeutic by her report [facility]  Specifically her lithium level May 17, 2019 on this dosage of lithium was 0.3.  Her urinalysis was normal she had a normal CBC, comprehensive metabolic panel was normal including creatinine 0.83 calcium 9.9 and TSH was normal at 2.83.  Lithium level in April 2019 was 0.2 at this dosage.  We will not increase the lithium because she is currently having tremor at the low dose.  .res Assessment: Plan:  Depression, major, recurrent, in partial remission ([place]) - Plan: methylphenidate (RITALIN) 20 MG tablet, methylphenidate (RITALIN) 20 MG tablet, methylphenidate (RITALIN) 20 MG tablet  Tremor, essential - Plan: propranolol (INDERAL) 20 MG tablet  Generalized anxiety disorder - Plan: propranolol (INDERAL) 20 MG tablet  Attention deficit hyperactivity disorder (ADHD), predominantly inattentive type - Plan: methylphenidate (RITALIN) 20 MG tablet, methylphenidate (RITALIN)  20 MG tablet, methylphenidate (RITALIN) 20 MG tablet  Lithium use  Lithium-induced tremor  Major depressive disorder, recurrent episode, moderate ([facility]) - Plan: lithium carbonate 300 MG capsule, methylphenidate (RITALIN) 20 MG tablet   .Greater than 50% of 30-minute face to face time with patient was spent on counseling and coordination of care. We discussed Taelyn has a long history over decades of cycling major depression and treatment resistance to meds noted above of nearly every antidepressant category.  Her best response so far has been from low-dose lithium with a stimulant but lamotrigine has seemed to help for 6 mos in 2020 until relapse in Summer.  The depression resolved again with the increase in lamotrigine from 100 to 150 mg daily.  They do not follow any pattern and do not appear to have any precipitant.   While she does not identify herself is bipolar because her upswings seem only normal to her.  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  We tried an increase lithium to 600 mg daily and check a blood level.   The increase in lithium did not help her mood and her made her tremor worse. Counseled patient regarding potential benefits, risks, and side effects of lithium to include potential risk of lithium affecting thyroid and renal function.  Discussed need for periodic lab monitoring to determine drug level and to assess for potential adverse effects.  Counseled patient regarding signs and symptoms of lithium toxicity and advised that they notify office immediately or seek urgent medical attention if experiencing these signs and symptoms.  Patient advised to contact office with any questions or concerns.    The tremor predates lithium and predates stimulant  use but appears to have been made worse by the increase in lamotrigine.  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  She plans to see a neurologist.  objectively the patient does not show significant mouth movements but she displayed video since her last visit in which typical mouth movements cheek movements associated with tardive dyskinesia was evident.  Her tremor is rhythmic and is not supportive of a tardive dyskinesia type tremor. History of essential tremor prior to lithium. Alternative primidone. Neurology concerned about balance with this one. Disc seeing neurology consult in detail. Retry propranolol per her request 40 mg twice daily for a few days and if no benefit then increase to 50 mg twice daily for a few days. Can increase every few days by 10 mg to a max of 80 mg twice daily as long as there are no side effects and you monitor blood pressure and pulse without significant changes or problems.  The increase lamotrigine to 150 mg daily resolved the depression.  it was helpful for several months at 100. Continue Ritalin as prescribed 3/4 tablet QID.  Prefers Malincroft generic. She got different brand Accord of methylphenidate and it shatters when tries to quarter it. Continue lithium 300 mg  nightly.  So far her mood is been stable with a lower dose  Discussed potential benefits, risks, and side effects of stimulants with patient to include increased heart rate, palpitations, insomnia, increased anxiety, increased irritability, or decreased appetite.  Instructed patient to contact office if experiencing any significant tolerability issues.  Continue  Vitamin Katrina Gamble. Disc reasons for doing so for mental and physical health in detail.  She has stopped bc she hates taking pills.  Option trazodone.   FU 8 weeks  Lynder Parents, MD, DFAPA '   Please see After Visit Summary for patient specific instructions.  Future Appointments  Date Time Provider [place]  12/08/2020  10:30 AM Princess Bruins, MD [facility] None    No orders of the defined types were placed in this encounter.     -------------------------------

## 2020-12-08 ENCOUNTER — Other Ambulatory Visit: Payer: Self-pay

## 2020-12-08 ENCOUNTER — Ambulatory Visit (INDEPENDENT_AMBULATORY_CARE_PROVIDER_SITE_OTHER): Payer: Medicare Other | Admitting: Obstetrics & Gynecology

## 2020-12-08 ENCOUNTER — Encounter: Payer: Self-pay | Admitting: Obstetrics & Gynecology

## 2020-12-08 VITALS — BP 128/80 | Ht 65.0 in | Wt 140.0 lb

## 2020-12-08 DIAGNOSIS — N83202 Unspecified ovarian cyst, left side: Secondary | ICD-10-CM | POA: Diagnosis not present

## 2020-12-08 DIAGNOSIS — Z780 Asymptomatic menopausal state: Secondary | ICD-10-CM

## 2020-12-08 DIAGNOSIS — Z01419 Encounter for gynecological examination (general) (routine) without abnormal findings: Secondary | ICD-10-CM | POA: Diagnosis not present

## 2020-12-08 DIAGNOSIS — R1909 Other intra-abdominal and pelvic swelling, mass and lump: Secondary | ICD-10-CM | POA: Diagnosis not present

## 2020-12-08 NOTE — Progress Notes (Signed)
Katrina Gamble Oct 04, 1940 [id number]   History:    80 y.o. G3P3L3  RP: Established patient presenting for annual gyn exam   HPI: Postmenopausal, well on no HRT.  No PMB.  No pelvic pain.  H/O Benign appearing Lt Ovarian Cysts stable on Pelvic US from 12/2019 to 06/2020.  She was having LLQ pain at that time and a few weeks after coming here she was worked up further and found to have diverticulitis.  Urine normal.  Breasts normal.  BMI 23.3.  Enjoys gardening.  Health labs with Fam MD.  Past medical history,surgical history, family history and social history were all reviewed and documented in the EPIC chart.  Gynecologic History No LMP recorded. Patient is postmenopausal.  Obstetric History OB History  Gravida Para Term Preterm AB Living  3 3 3     3   SAB IAB Ectopic Multiple Live Births               # Outcome Date GA Lbr Len/2nd Weight Sex Delivery Anes PTL Lv  3 Term           2 Term           1 Term              ROS: A ROS was performed and pertinent positives and negatives are included in the history.  GENERAL: No fevers or chills. HEENT: No change in vision, no earache, sore throat or sinus congestion. NECK: No pain or stiffness. CARDIOVASCULAR: No chest pain or pressure. No palpitations. PULMONARY: No shortness of breath, cough or wheeze. GASTROINTESTINAL: No abdominal pain, nausea, vomiting or diarrhea, melena or bright red blood per rectum. GENITOURINARY: No urinary frequency, urgency, hesitancy or dysuria. MUSCULOSKELETAL: No joint or muscle pain, no back pain, no recent trauma. DERMATOLOGIC: No rash, no itching, no lesions. ENDOCRINE: No polyuria, polydipsia, no heat or cold intolerance. No recent change in weight. HEMATOLOGICAL: No anemia or easy bruising or bleeding. NEUROLOGIC: No headache, seizures, numbness, tingling or weakness. PSYCHIATRIC: No depression, no loss of interest in normal activity or change in sleep pattern.     Exam:   BP 128/80 (BP  Location: Right Arm, Patient Position: Sitting, Cuff Size: Normal)   Ht 5\' 5"  (1.651 m)   Wt 140 lb (63.5 kg)   BMI 23.30 kg/m   Body mass index is 23.3 kg/m.  General appearance : Well developed well nourished female. No acute distress HEENT: Eyes: no retinal hemorrhage or exudates,  Neck supple, trachea midline, no carotid bruits, no thyroidmegaly Lungs: Clear to auscultation, no rhonchi or wheezes, or rib retractions  Heart: Regular rate and rhythm, no murmurs or gallops Breast:Examined in sitting and supine position were symmetrical in appearance, no palpable masses or tenderness,  no skin retraction, no nipple inversion, no nipple discharge, no skin discoloration, no axillary or supraclavicular lymphadenopathy Abdomen: no palpable masses or tenderness, no rebound or guarding Extremities: no edema or skin discoloration or tenderness  Pelvic: Vulva: Normal             Vagina: No gross lesions or discharge  Cervix: No gross lesions or discharge  Uterus  AV, normal size, shape and consistency, non-tender and mobile  Adnexa  Without masses felt.  Mild tenderness at the left adnexa/pelvis.  Anus: Normal   Assessment/Plan:  80 y.o. female for annual exam   1. Well female exam with routine gynecological exam Gynecologic exam with mild tenderness at the Lt adnexa.  No indication for  a Pap test this year.  Breast exam normal.  Screening mammogram November 2021 was negative.  Colonoscopy in 2010, schedule a repeat colonoscopy now.  Health labs with family physician.  Good body mass index at 23.3.  Continue with gardening and healthy nutrition.  2. Postmenopause Well on no hormone replacement therapy.  No postmenopausal bleeding.  Bone density March 2021 was completely normal.  3. Cyst of left ovary Stable benign-appearing left ovarian cysts between pelvic ultrasound April 2021 and October 2021.  We will complete the screening with a CA-125 today.  Follow-up pelvic ultrasound to confirm  stability October 2022.  Patient will call and present earlier if develops pelvic pain. - CA 125 - US Transvaginal Non-OB; Future  Princess Bruins MD, 11:17 AM 12/08/2020

## 2020-12-09 ENCOUNTER — Encounter: Payer: Self-pay | Admitting: Obstetrics & Gynecology

## 2020-12-09 LAB — CA 125: CA 125: 8 U/mL (ref <35)

## 2020-12-16 ENCOUNTER — Encounter: Payer: Self-pay | Admitting: *Deleted

## 2021-01-25 ENCOUNTER — Encounter: Payer: Self-pay | Admitting: Psychiatry

## 2021-01-25 ENCOUNTER — Ambulatory Visit (INDEPENDENT_AMBULATORY_CARE_PROVIDER_SITE_OTHER): Payer: Medicare Other | Admitting: Psychiatry

## 2021-01-25 ENCOUNTER — Other Ambulatory Visit: Payer: Self-pay

## 2021-01-25 DIAGNOSIS — G251 Drug-induced tremor: Secondary | ICD-10-CM | POA: Diagnosis not present

## 2021-01-25 DIAGNOSIS — F411 Generalized anxiety disorder: Secondary | ICD-10-CM | POA: Diagnosis not present

## 2021-01-25 DIAGNOSIS — Z79899 Other long term (current) drug therapy: Secondary | ICD-10-CM

## 2021-01-25 DIAGNOSIS — G250 Essential tremor: Secondary | ICD-10-CM | POA: Diagnosis not present

## 2021-01-25 DIAGNOSIS — F3341 Major depressive disorder, recurrent, in partial remission: Secondary | ICD-10-CM | POA: Diagnosis not present

## 2021-01-25 DIAGNOSIS — F900 Attention-deficit hyperactivity disorder, predominantly inattentive type: Secondary | ICD-10-CM

## 2021-01-25 MED ORDER — METHYLPHENIDATE HCL 20 MG PO TABS: 20.0000 mg | ORAL_TABLET | Freq: Three times a day (TID) | ORAL | 0 refills | Status: DC

## 2021-01-25 MED ORDER — METHYLPHENIDATE HCL 20 MG PO TABS: 20.0000 mg | ORAL_TABLET | Freq: Two times a day (BID) | ORAL | 0 refills | Status: DC

## 2021-01-25 NOTE — Progress Notes (Signed)
Katrina Gamble [id number] 12-02-1940 80 y.o.     Subjective:   Patient ID:  Katrina Gamble is a 80 y.o. (DOB 1940-11-02) female.  Chief Complaint:  Chief Complaint  Patient presents with  . Follow-up  . Depression  . Stress  . Anxiety    Depression        Associated symptoms include no decreased concentration, no fatigue and no suicidal ideas.  Past medical history includes anxiety.   Anxiety Patient reports no chest pain, confusion, decreased concentration, nausea, nervous/anxious behavior, palpitations or suicidal ideas.    Medication Refill Pertinent negatives include no abdominal pain, chest pain, fatigue, nausea or weakness.   Katrina Gamble presents today for follow-up of ADD and mood.  When t seen May 15, 2019.  She continued to have cycles of depression without clear precipitant.  It was suggested that she increase lithium to 450 mg daily check a lithium level.  seen July 15, 2019.  She was having more depression.  The following changes were made. Rec increase lithium to 600 mg daily for a week and check blood level. If this fails to help or is intolerable then recommend lamotrigine trial as noted. She continued Ritalin 20 mg 3 times daily per usual  No lithium level nor BMP were received.  seen August 20 2019.  She was still dealing with depression and had not seen any improvement in depression at 600 mg versus 450 mg daily.  She did not get a blood level. Reduce lithium to 450 mg daily DT NR at 600 daily  recommend lamotrigine trial as noted.  She called back December 30 stating she had been on lamotrigine for 2 weeks and was noticing a tremor and wondered if it was related.  BC of tremor she went down again from 450 mg in lithium and had reduced it to 300 mg instead of 450 mg.  seen October 15, 2019.  The following was noted: CO  Worsening tremor intentional affecting mouse use about December 30.  Wonders about essential tremor vs TD.    Thyroid checked a couple months ago and dose was not changed.  Tremor in head interfered with dental procedures some.  Alcohol does not help tremor and  Never has.   Essential tremor dx decades ago. Mood is better with lamotrigine 100 mg daily.  Mood more level.   Plan: No meds were changed.  She continued the following: Continue lamotrigine 100 mg daily as it was helpful. Continue Ritalin as prescribed. Continue lithium 300 mg nightly.  So far her mood is been stable with a lower dose Not using much propranolol.  01/12/2020 appointment, the following is noted: Questions about the propranolol.  Still has sig tremor that varies in intensity from mild to severe.  Not always bothered by it unless doing things with fine motor control like writing.  Has taken up to 20 mg propranolol and not more bc ? GI SE.   Mood still good.  Dep controlled.  Anxiety hard to tell bc D with recurrent breast CA. Plan no meds changed.  04/13/2020 appointment with the following noted: Propranolol with limited effect on tremor even up to 50 mg daily.  Hard time writing. Worse with guilty, anxious depressed thoughts.  Neg self talk.  Not sure if anxiety triggered neg thinking.  No change stress or meds.  Sleep is disjointed.  Plan: Increase lamotrigine to 150 mg daily as it was helpful for several months at 100. Continue Ritalin as prescribed.  Continue lithium 300 mg nightly.   05/25/2020 appointment with the following noted: After increased lamotrigine to mood overall has been very good with mood.  Bad part is tremor has gotten much worse with intention.  Wonders about neurologist. No med changes  08/24/2020 appointment with the following noted:  Disc lithium and kidney function and questions about CKD 3. Missed neuro appt bc got sick right before it.  RS for FEB.  But opening for tomorrow.  Tremor is increasing . Insomnia is worse.  Trouble going to sleep for a couple of hours.  3 nights per week. Racing thoughts  all over the place.  Some are regretful thoughts from the past but random things.  Don't have the thoughts in the day. No naps. Cut caffeine max 2 cups coffee as late as 11 AM. Ritalin as late as 630 pm and usually that won't bother sleep.  Kids think her ADD meds aren't working well.  Scattered and hard to stay on task.   Not hyper spending or otherwise hyper.  Always tendency to talk if someone to listen. Can internally feel tense.  Not irritable. Doesn't want more meds. Pretty good with depression. Plan: The increase lamotrigine to 150 mg daily resolved the depression.  it was helpful for several months at 100. Continue Ritalin as prescribed 3/4 tablet QID.  Prefers Malincroft generic. She got different brand Accord of methylphenidate and it shatters when tries to quarter it. Continue lithium 300 mg nightly.  Agree with her plans to see neurology.  11/22/2020 appointment with following noted: Neurology diagnosed essential tremor with possible exacerbation by lithium and anxiety and suggested at the moment to withhold treatment for fear of worsening her balance and because the tremor was rather mild on exam.  MRI showed age-appropriate moderate chronic white matter disease. Pt doesn't think her tremor is mild as did neuro.  Worse with writing.  Disc neuro consult.  Disc propranolol dosing and risk of depression at higher dose.  Tremor noticeable in AM before coffee and affects writing and typing. Depression is under control.  Anxiety a little worse and experienced physically.  If get anxious then it builds on itself. Denies appetite disturbance.  Patient reports that energy and motivation have been good.  Patient denies any difficulty with concentration. Patient denies any suicidal ideation. Plan: Retry propranolol per her request 40 mg twice daily for a few days and if no benefit then increase to 50 mg twice daily for a few days. Can increase every few days by 10 mg to a max of 80 mg twice  daily as long as there are no side effects and you monitor blood pressure and pulse without significant changes or problems. The increase lamotrigine to 150 mg daily resolved the depression.  it was helpful for several months at 100. Continue Ritalin as prescribed 3/4 tablet QID.  Prefers Malincroft generic. She got different brand Accord of methylphenidate and it shatters when tries to quarter it. Continue lithium 300 mg nightly.   01/25/2021 appointment with the following noted: Difficult time adjusting to and fearful of changing meds.  Had number of stressors.   Since here # stressors.  GS rehab alcohol and bipolar disorder.   Recognizes pattern of avoidance in her life.  Plans to still try the propranolol bc the tremor is better. Patient reports stable mood and denies depressed or irritable moods.   Patient denies difficulty with sleep initiation or maintenance. Denies appetite disturbance.  Patient reports that energy and motivation have  been good.  Patient denies any difficulty with concentration.  Patient denies any suicidal ideation.   Consistent with meds.  Holter monitor occ SVT and rare VT but not requiring treatment  H died 4 years ago  Youngest D relapse breast CA.  Stressful.  New great grand D [place].  Psych med history:  Abilify no response,  Mellaril,  Nardil, Prozac with side effects, try cyclic antidepressants, Viibryd, Trintellix caused anxiety,  Wellbutrin which caused tremors, imipramine, Pristiq 100,  Vyvanse, Cerefolin NAC, Ritalin, history of Dexedrine during pregnancy which caused weight gain,   lithium 600 tremor worse (started lithium 150 mg Nov 2018) Lamotrigine 150 Propranolol 50 without helpf for tremor  Review of Systems:  Review of Systems  Constitutional: Negative for fatigue.  Cardiovascular: Negative for chest pain and palpitations.  Gastrointestinal: Negative for abdominal pain and nausea.  Neurological: Positive for tremors. Negative for weakness.        Tremor worse at lithium 600 mg daily versus lithium 450 mg daily  Psychiatric/Behavioral: Positive for depression, dysphoric mood and sleep disturbance. Negative for agitation, behavioral problems, confusion, decreased concentration, hallucinations, self-injury and suicidal ideas. The patient is not nervous/anxious and is not hyperactive.     Medications: I have reviewed the patient's current medications.  Current Outpatient Medications  Medication Sig Dispense Refill  . lamoTRIgine (LAMICTAL) 150 MG tablet Take 1 tablet (150 mg total) by mouth daily. 90 tablet 1  . levothyroxine (SYNTHROID, LEVOTHROID) 25 MCG tablet Take 25 mcg by mouth daily before breakfast.    . lithium carbonate 300 MG capsule Take 1 capsule (300 mg total) by mouth daily. 90 capsule 3  . [START ON 03/22/2021] methylphenidate (RITALIN) 20 MG tablet Take 1 tablet (20 mg total) by mouth 2 (two) times daily. 90 tablet 0  . methylphenidate (RITALIN) 20 MG tablet Take 1 tablet (20 mg total) by mouth 3 (three) times daily with meals. 90 tablet 0  . [START ON 02/22/2021] methylphenidate (RITALIN) 20 MG tablet Take 1 tablet (20 mg total) by mouth 3 (three) times daily with meals. 90 tablet 0  . propranolol (INDERAL) 20 MG tablet Take 3 tablets (60 mg total) by mouth 2 (two) times daily. (Patient not taking: Reported on 01/25/2021) 180 tablet 1   No current facility-administered medications for this visit.    Medication Side Effects: tremor is affecting handwriting and computer worse with 600 mg lithium..  Numbness in toes.  Hands and feet always cold.  Allergies:  Allergies  Allergen Reactions  . Codeine Other (See Comments)    Abdominal pain   . Latex Hives  . Septra [Sulfamethoxazole-Trimethoprim] Rash  . Sulfa Antibiotics Rash and Other (See Comments)    Past Medical History:  Diagnosis Date  . ADD (attention deficit disorder)   . Depression   . Diverticulitis   . Hypothyroid    d/t lithium  . Osteopenia  11/2017   T score -1.1 FRAX 24% / 14%  . Peripheral neuropathy   . Tremor, essential   . Vitamin D deficiency 12/2017    Family History  Problem Relation Age of Onset  . Hypertension Mother   . Thyroid disease Mother   . Breast cancer Mother 45  . Breast cancer Daughter 49  . Thyroid disease Daughter   . Other Father        Brain tumor  . Parkinson's disease Father   . Dementia Father   . Pneumonia Father   . Cancer Daughter  Melanoma  . Heart disease Paternal Grandfather   . Leukemia Maternal Grandmother   . Stomach cancer Paternal Grandmother     Social History   Socioeconomic History  . Marital status: Widowed    Spouse name: Not on file  . Number of children: 3  . Years of education: Not on file  . Highest education level: Bachelor's degree (e.g., BA, AB, BS)  Occupational History    Comment: retired  Tobacco Use  . Smoking status: Former Smoker    Packs/day: 0.50    Years: 30.00    Pack years: 15.00    Types: Cigarettes    Quit date: 2012    Years since quitting: 10.3  . Smokeless tobacco: Never Used  Vaping Use  . Vaping Use: Never used  Substance and Sexual Activity  . Alcohol use: Yes    Alcohol/week: 0.0 standard drinks    Comment: 08/25/20 2 4oz glasses wine 5 x week  . Drug use: Never  . Sexual activity: Never    Birth control/protection: Post-menopausal    Comment: 1st intercourse 80 yo-Fewer than 5 partners  Other Topics Concern  . Not on file  Social History Narrative   Lives alone   Caffeine- 1 c coffee   Social Determinants of Health   Financial Resource Strain: Not on file  Food Insecurity: Not on file  Transportation Needs: Not on file  Physical Activity: Not on file  Stress: Not on file  Social Connections: Not on file  Intimate Partner Violence: Not on file    Past Medical History, Surgical history, Social history, and Family history were reviewed and updated as appropriate.   Please see review of systems for further  details on the patient's review from today.   Objective:   Physical Exam:  There were no vitals taken for this visit.  Physical Exam Constitutional:      General: She is not in acute distress.    Appearance: She is well-developed and normal weight.  Musculoskeletal:        General: No deformity.  Neurological:     Mental Status: She is alert and oriented to person, place, and time.     Cranial Nerves: No dysarthria.     Motor: Tremor present.     Coordination: Coordination normal.  Psychiatric:        Attention and Perception: Perception normal. She is inattentive. She does not perceive auditory or visual hallucinations.        Mood and Affect: Mood is not depressed. Affect is not labile, blunt, angry, tearful or inappropriate.        Speech: Speech normal.        Behavior: Behavior normal. Behavior is cooperative.        Thought Content: Thought content normal. Thought content is not paranoid or delusional. Thought content does not include homicidal or suicidal ideation. Thought content does not include homicidal or suicidal plan.        Cognition and Memory: Cognition and memory normal.        Judgment: Judgment normal.     Comments: Good insight Depression better after increase lamotrigine   objectively the patient does not show significant mouth movements but she displayed video at prior visit in which typical mouth movements cheek movements associated with tardive dyskinesia was evident.  Her tremor is rhythmic and is not supportive of a tardive dyskinesia type tremor.  Lab Review:     Component Value Date/Time   NA 139 08/25/2020 1410  K 4.3 08/25/2020 1410   CL 103 08/25/2020 1410   CO2 22 08/25/2020 1410   GLUCOSE 85 08/25/2020 1410   BUN 11 08/25/2020 1410   CREATININE 0.93 08/25/2020 1410   CALCIUM 9.7 08/25/2020 1410   PROT 6.7 08/25/2020 1410   ALBUMIN 4.3 08/25/2020 1410   AST 22 08/25/2020 1410   ALT 16 08/25/2020 1410   ALKPHOS 69 08/25/2020 1410    BILITOT 0.5 08/25/2020 1410   GFRNONAA 59 (L) 08/25/2020 1410   GFRAA 69 08/25/2020 1410    No results found for: WBC, RBC, HGB, HCT, PLT, MCV, MCH, MCHC, RDW, LYMPHSABS, MONOABS, EOSABS, BASOSABS  No results found for: POCLITH, LITHIUM   No results found for: PHENYTOIN, PHENOBARB, VALPROATE, CBMZ   She says lithium level was 0.6 at PCP on 450 mg daily.   07/2020 lithium low therapeutic by her report [facility]  Specifically her lithium level May 17, 2019 on this dosage of lithium was 0.3.  Her urinalysis was normal she had a normal CBC, comprehensive metabolic panel was normal including creatinine 0.83 calcium 9.9 and TSH was normal at 2.83.  Lithium level in April 2019 was 0.2 at this dosage.  We will not increase the lithium because she is currently having tremor at the low dose.  .res Assessment: Plan:    Depression, major, recurrent, in partial remission ([place]) - Plan: methylphenidate (RITALIN) 20 MG tablet, methylphenidate (RITALIN) 20 MG tablet, methylphenidate (RITALIN) 20 MG tablet  Tremor, essential  Generalized anxiety disorder  Attention deficit hyperactivity disorder (ADHD), predominantly inattentive type - Plan: methylphenidate (RITALIN) 20 MG tablet, methylphenidate (RITALIN) 20 MG tablet, methylphenidate (RITALIN) 20 MG tablet  Lithium use  Lithium-induced tremor   .Greater than 50% of 30-minute face to face time with patient was spent on counseling and coordination of care. We discussed Lotta has a long history over decades of cycling major depression and treatment resistance to meds noted above of nearly every antidepressant category.  Her best response so far has been from low-dose lithium with a stimulant but lamotrigine has seemed to help for 6 mos in 2020 until relapse in Summer.  The depression resolved again with the increase in lamotrigine from 100 to 150 mg daily.  They do not follow any pattern and do not appear to have any precipitant.   While she does  not identify herself is bipolar because her upswings seem only normal to her.  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  We tried an increase lithium to 600 mg daily and check a blood level.   The increase in lithium did not help her mood and her made her tremor worse. Counseled patient regarding potential benefits, risks, and side effects of lithium to include potential risk of lithium affecting thyroid and renal function.  Discussed need for periodic lab monitoring to determine drug level and to assess for potential adverse effects.  Counseled patient regarding signs and symptoms of lithium toxicity and advised that they notify office immediately or seek urgent medical attention if experiencing these signs and symptoms.  Patient advised to contact office with any questions or concerns.    The tremor predates lithium and predates stimulant use but appears to have been made worse by the increase in lamotrigine.  It appears to be predominantly  an intention tremor.  She plans to see a neurologist.  objectively the patient does not show significant mouth movements but she displayed video since her last visit in which typical mouth movements cheek movements associated with tardive dyskinesia was evident.  Her tremor is rhythmic and is not supportive of a tardive dyskinesia type tremor. History of essential tremor prior to lithium. Alternative primidone. Neurology concerned about balance with this one. Disc seeing neurology consult in detail. Retry propranolol when she feel ready per her request 40 mg twice daily for a few days and if no benefit then increase to 50 mg twice daily for a few days. Can increase every few days by 10 mg to a max of 80 mg twice daily as long as there are  no side effects and you monitor blood pressure and pulse without significant changes or problems. It can help both tremor and anxiety.  The increase lamotrigine to 150 mg daily resolved the depression.  it was helpful for several months at 100. Continue Ritalin as prescribed 3/4 tablet QID.  Prefers Malincroft generic. She got different brand Accord of methylphenidate and it shatters when tries to quarter it. Continue lithium 300 mg nightly.  So far her mood is been stable with a lower dose  Discussed potential benefits, risks, and side effects of stimulants with patient to include increased heart rate, palpitations, insomnia, increased anxiety, increased irritability, or decreased appetite.  Instructed patient to contact office if experiencing any significant tolerability issues.  Continue  Vitamin D. Disc reasons for doing so for mental and physical health in detail.  She has stopped bc she hates taking pills.  Option trazodone.   Supportive therapy dealing with GS's mental health problems.  FU 8 weeks  Lynder Parents, MD, DFAPA '   Please see After Visit Summary for patient specific instructions.  Future Appointments  Date Time Provider [place]  06/15/2021 10:00 AM [facility] [place] [facility] None  06/15/2021 10:30 AM Princess Bruins, MD [facility] None  12/14/2021 10:30 AM Princess Bruins, MD [facility] None    No orders of the defined types were placed in this encounter.     -------------------------------

## 2021-01-27 DIAGNOSIS — H2512 Age-related nuclear cataract, left eye: Secondary | ICD-10-CM | POA: Diagnosis not present

## 2021-04-13 ENCOUNTER — Other Ambulatory Visit: Payer: Self-pay | Admitting: Psychiatry

## 2021-04-13 DIAGNOSIS — F332 Major depressive disorder, recurrent severe without psychotic features: Secondary | ICD-10-CM

## 2021-04-24 ENCOUNTER — Ambulatory Visit: Payer: Medicare Other | Admitting: Psychiatry

## 2021-05-10 ENCOUNTER — Ambulatory Visit: Payer: Medicare Other | Admitting: Psychiatry

## 2021-05-11 ENCOUNTER — Encounter: Payer: Self-pay | Admitting: Psychiatry

## 2021-05-11 ENCOUNTER — Other Ambulatory Visit: Payer: Self-pay

## 2021-05-11 ENCOUNTER — Ambulatory Visit (INDEPENDENT_AMBULATORY_CARE_PROVIDER_SITE_OTHER): Payer: Medicare Other | Admitting: Psychiatry

## 2021-05-11 VITALS — BP 135/73 | HR 76

## 2021-05-11 DIAGNOSIS — G251 Drug-induced tremor: Secondary | ICD-10-CM | POA: Diagnosis not present

## 2021-05-11 DIAGNOSIS — Z79899 Other long term (current) drug therapy: Secondary | ICD-10-CM | POA: Diagnosis not present

## 2021-05-11 DIAGNOSIS — F411 Generalized anxiety disorder: Secondary | ICD-10-CM | POA: Diagnosis not present

## 2021-05-11 DIAGNOSIS — F900 Attention-deficit hyperactivity disorder, predominantly inattentive type: Secondary | ICD-10-CM | POA: Diagnosis not present

## 2021-05-11 DIAGNOSIS — R7989 Other specified abnormal findings of blood chemistry: Secondary | ICD-10-CM

## 2021-05-11 DIAGNOSIS — G250 Essential tremor: Secondary | ICD-10-CM

## 2021-05-11 DIAGNOSIS — F3341 Major depressive disorder, recurrent, in partial remission: Secondary | ICD-10-CM | POA: Diagnosis not present

## 2021-05-11 MED ORDER — METHYLPHENIDATE HCL 20 MG PO TABS: 20.0000 mg | ORAL_TABLET | Freq: Two times a day (BID) | ORAL | 0 refills | Status: DC

## 2021-05-11 MED ORDER — METHYLPHENIDATE HCL 20 MG PO TABS: 20.0000 mg | ORAL_TABLET | Freq: Three times a day (TID) | ORAL | 0 refills | Status: DC

## 2021-05-11 MED ORDER — OXCARBAZEPINE 300 MG PO TABS: ORAL_TABLET | ORAL | 1 refills | Status: DC

## 2021-05-11 NOTE — Progress Notes (Signed)
Katrina Gamble [id number] 1940-09-20 80 y.o.     Subjective:   Patient ID:  Katrina Gamble is a 80 y.o. (DOB April 23, 1941) female.  Chief Complaint:  Chief Complaint  Patient presents with   Follow-up   Depression   Other    Tremor    ADHD    Depression        Associated symptoms include no decreased concentration, no fatigue and no suicidal ideas.  Past medical history includes anxiety.   Anxiety Patient reports no confusion, decreased concentration, nausea, nervous/anxious behavior, palpitations or suicidal ideas.    Medication Refill Pertinent negatives include no abdominal pain, fatigue, nausea or weakness.  Katrina Gamble presents today for follow-up of ADD and mood.  When t seen May 15, 2019.  She continued to have cycles of depression without clear precipitant.  It was suggested that she increase lithium to 450 mg daily check a lithium level.  seen July 15, 2019.  She was having more depression.  The following changes were made. Rec increase lithium to 600 mg daily for a week and check blood level. If this fails to help or is intolerable then recommend lamotrigine trial as noted. She continued Ritalin 20 mg 3 times daily per usual  No lithium level nor BMP were received.  seen August 20 2019.  She was still dealing with depression and had not seen any improvement in depression at 600 mg versus 450 mg daily.  She did not get a blood level. Reduce lithium to 450 mg daily DT NR at 600 daily  recommend lamotrigine trial as noted.  She called back December 30 stating she had been on lamotrigine for 2 weeks and was noticing a tremor and wondered if it was related.  BC of tremor she went down again from 450 mg in lithium and had reduced it to 300 mg instead of 450 mg.  seen October 15, 2019.  The following was noted: CO  Worsening tremor intentional affecting mouse use about December 30.  Wonders about essential tremor vs TD.   Thyroid checked a  couple months ago and dose was not changed.  Tremor in head interfered with dental procedures some.  Alcohol does not help tremor and  Never has.   Essential tremor dx decades ago. Mood is better with lamotrigine 100 mg daily.  Mood more level.   Plan: No meds were changed.  She continued the following: Continue lamotrigine 100 mg daily as it was helpful. Continue Ritalin as prescribed. Continue lithium 300 mg nightly.  So far her mood is been stable with a lower dose Not using much propranolol.  01/12/2020 appointment, the following is noted: Questions about the propranolol.  Still has sig tremor that varies in intensity from mild to severe.  Not always bothered by it unless doing things with fine motor control like writing.  Has taken up to 20 mg propranolol and not more bc ? GI SE.   Mood still good.  Dep controlled.  Anxiety hard to tell bc D with recurrent breast CA. Plan no meds changed.  04/13/2020 appointment with the following noted: Propranolol with limited effect on tremor even up to 50 mg daily.  Hard time writing. Worse with guilty, anxious depressed thoughts.  Neg self talk.  Not sure if anxiety triggered neg thinking.  No change stress or meds.  Sleep is disjointed.  Plan: Increase lamotrigine to 150 mg daily as it was helpful for several months at 100. Continue Ritalin as prescribed.  Continue lithium 300 mg nightly.   05/25/2020 appointment with the following noted: After increased lamotrigine to mood overall has been very good with mood.  Bad part is tremor has gotten much worse with intention.  Wonders about neurologist. No med changes  08/24/2020 appointment with the following noted:  Disc lithium and kidney function and questions about CKD 3. Missed neuro appt bc got sick right before it.  RS for FEB.  But opening for tomorrow.  Tremor is increasing . Insomnia is worse.  Trouble going to sleep for a couple of hours.  3 nights per week. Racing thoughts all over the place.   Some are regretful thoughts from the past but random things.  Don't have the thoughts in the day. No naps. Cut caffeine max 2 cups coffee as late as 11 AM. Ritalin as late as 630 pm and usually that won't bother sleep.  Kids think her ADD meds aren't working well.  Scattered and hard to stay on task.   Not hyper spending or otherwise hyper.  Always tendency to talk if someone to listen. Can internally feel tense.  Not irritable. Doesn't want more meds. Pretty good with depression. Plan: The increase lamotrigine to 150 mg daily resolved the depression.  it was helpful for several months at 100. Continue Ritalin as prescribed 3/4 tablet QID.  Prefers Malincroft generic. She got different brand Accord of methylphenidate and it shatters when tries to quarter it. Continue lithium 300 mg nightly.  Agree with her plans to see neurology.  11/22/2020 appointment with following noted: Neurology diagnosed essential tremor with possible exacerbation by lithium and anxiety and suggested at the moment to withhold treatment for fear of worsening her balance and because the tremor was rather mild on exam.  MRI showed age-appropriate moderate chronic white matter disease. Pt doesn't think her tremor is mild as did neuro.  Worse with writing.  Disc neuro consult.  Disc propranolol dosing and risk of depression at higher dose.  Tremor noticeable in AM before coffee and affects writing and typing. Depression is under control.  Anxiety a little worse and experienced physically.  If get anxious then it builds on itself. Denies appetite disturbance.  Patient reports that energy and motivation have been good.  Patient denies any difficulty with concentration. Patient denies any suicidal ideation. Plan: Retry propranolol per her request 40 mg twice daily for a few days and if no benefit then increase to 50 mg twice daily for a few days. Can increase every few days by 10 mg to a max of 80 mg twice daily as long as there  are no side effects and you monitor blood pressure and pulse without significant changes or problems. The increase lamotrigine to 150 mg daily resolved the depression.  it was helpful for several months at 100. Continue Ritalin as prescribed 3/4 tablet QID.  Prefers Malincroft generic. She got different brand Accord of methylphenidate and it shatters when tries to quarter it. Continue lithium 300 mg nightly.   01/25/2021 appointment with the following noted: Difficult time adjusting to and fearful of changing meds.  Had number of stressors.   Since here # stressors.  GS rehab alcohol and bipolar disorder.   Recognizes pattern of avoidance in her life.  Plans to still try the propranolol bc the tremor is better. Patient reports stable mood and denies depressed or irritable moods.   Patient denies difficulty with sleep initiation or maintenance. Denies appetite disturbance.  Patient reports that energy and motivation have  been good.  Patient denies any difficulty with concentration.  Patient denies any suicidal ideation.  Consistent with meds. Pllan: Retry propranolol when she feel ready per her request 40 mg twice daily for a few days and if no benefit then increase to 50 mg twice daily for a few days. Can increase every few days by 10 mg to a max of 80 mg twice daily as long as there are no side effects and you monitor blood pressure and pulse without significant changes or problems. It can help both tremor and anxiety. The increase lamotrigine to 150 mg daily resolved the depression.  it was helpful for several months at 100. Continue Ritalin as prescribed 3/4 tablet QID.  Prefers Malincroft generic. She got different brand Accord of methylphenidate and it shatters when tries to quarter it. Continue lithium 300 mg nightly.   05/11/2021 appointment with the following noted: Continues lamotrigine 150 mg daily, lithium 300 mg daily, Ritalin 20 mg 3 times daily.  Has not been taking propranolol which  was prescribed last visit for tremor and anxiety.  She never tried it.  She didn't feel it was bad enough to warrant treatment.  Tremor affects use of mouse.  Affects writing.   Has recurrence of obsessive thinking and trouble sleeping for a couple of mos.  Obs thoughts on things she's done or not done and that she's responsible for something bad that might happen..  Obs interfere with sleep and are worse.  Last night after 330 before asleep and awake 730.  Never nap and not really tired in the daytime.   Periods of several days in a row of Energizer bunny and then resolves.  Holter monitor occ SVT and rare VT but not requiring treatment  H died 4 years ago  Youngest D relapse breast CA.  Stressful.  New great grand D [place].  Psych med history:  Abilify no response,  Mellaril,  Refuses Seroquel bc F problems with it.  Nardil hyper and goofy, Prozac with side effects, try cyclic antidepressants, Viibryd, Trintellix caused anxiety,  Wellbutrin which caused tremors and goofy which Lexapro toned down, imipramine, Pristiq 100,  Vyvanse, Cerefolin NAC, Ritalin, history of Dexedrine during pregnancy which caused weight gain,  lithium 600 tremor worse (started lithium 150 mg Nov 2018) Lamotrigine 150 Propranolol 50 without helpf for tremor  [facility] hosp for obs on dangerousness irrationally about water in a vase. and panic at [facility] and was on Nardil at the time. F took Seroquel for hallucinations with PD and SE  Review of Systems:  Review of Systems  Constitutional:  Negative for fatigue.  Cardiovascular:  Negative for palpitations.  Gastrointestinal:  Negative for abdominal pain and nausea.  Neurological:  Positive for tremors. Negative for weakness.       Tremor worse at lithium 600 mg daily versus lithium 450 mg daily  Psychiatric/Behavioral:  Positive for dysphoric mood and sleep disturbance. Negative for agitation, behavioral problems, confusion, decreased concentration,  hallucinations, self-injury and suicidal ideas. The patient is not nervous/anxious and is not hyperactive.    Medications: I have reviewed the patient's current medications.  Current Outpatient Medications  Medication Sig Dispense Refill   lamoTRIgine (LAMICTAL) 150 MG tablet TAKE ONE TABLET BY MOUTH DAILY 90 tablet 0   levothyroxine (SYNTHROID, LEVOTHROID) 25 MCG tablet Take 25 mcg by mouth daily before breakfast.     lithium carbonate 300 MG capsule Take 1 capsule (300 mg total) by mouth daily. 90 capsule 3   Oxcarbazepine (TRILEPTAL)  300 MG tablet 1/2 tablet at night for 5 days, then 1 at night 30 tablet 1   [START ON 07/06/2021] methylphenidate (RITALIN) 20 MG tablet Take 1 tablet (20 mg total) by mouth 3 (three) times daily with meals. 90 tablet 0   [START ON 06/08/2021] methylphenidate (RITALIN) 20 MG tablet Take 1 tablet (20 mg total) by mouth 3 (three) times daily with meals. 90 tablet 0   methylphenidate (RITALIN) 20 MG tablet Take 1 tablet (20 mg total) by mouth 2 (two) times daily. 90 tablet 0   propranolol (INDERAL) 20 MG tablet Take 3 tablets (60 mg total) by mouth 2 (two) times daily. (Patient not taking: No sig reported) 180 tablet 1   No current facility-administered medications for this visit.    Medication Side Effects: tremor is affecting handwriting and computer worse with 600 mg lithium..  Numbness in toes.  Hands and feet always cold.  Allergies:  Allergies  Allergen Reactions   Codeine Other (See Comments)    Abdominal pain    Latex Hives   Septra [Sulfamethoxazole-Trimethoprim] Rash   Sulfa Antibiotics Rash and Other (See Comments)    Past Medical History:  Diagnosis Date   ADD (attention deficit disorder)    Depression    Diverticulitis    Hypothyroid    d/t lithium   Osteopenia 11/2017   T score -1.1 FRAX 24% / 14%   Peripheral neuropathy    Tremor, essential    Vitamin D deficiency 12/2017    Family History  Problem Relation Age of Onset    Hypertension Mother    Thyroid disease Mother    Breast cancer Mother 63   Breast cancer Daughter 95   Thyroid disease Daughter    Other Father        Brain tumor   Parkinson's disease Father    Dementia Father    Pneumonia Father    Cancer Daughter        Melanoma   Heart disease Paternal Grandfather    Leukemia Maternal Grandmother    Stomach cancer Paternal Grandmother     Social History   Socioeconomic History   Marital status: Widowed    Spouse name: Not on file   Number of children: 3   Years of education: Not on file   Highest education level: Bachelor's degree (e.g., BA, AB, BS)  Occupational History    Comment: retired  Tobacco Use   Smoking status: Former    Packs/day: 0.50    Years: 30.00    Pack years: 15.00    Types: Cigarettes    Quit date: 2012    Years since quitting: 10.6   Smokeless tobacco: Never  Vaping Use   Vaping Use: Never used  Substance and Sexual Activity   Alcohol use: Yes    Alcohol/week: 0.0 standard drinks    Comment: 08/25/20 2 4oz glasses wine 5 x week   Drug use: Never   Sexual activity: Never    Birth control/protection: Post-menopausal    Comment: 1st intercourse 80 yo-Fewer than 5 partners  Other Topics Concern   Not on file  Social History Narrative   Lives alone   Caffeine- 1 c coffee   Social Determinants of Health   Financial Resource Strain: Not on file  Food Insecurity: Not on file  Transportation Needs: Not on file  Physical Activity: Not on file  Stress: Not on file  Social Connections: Not on file  Intimate Partner Violence: Not on file  Past Medical History, Surgical history, Social history, and Family history were reviewed and updated as appropriate.   Please see review of systems for further details on the patient's review from today.   Objective:   Physical Exam:  BP 135/73   Pulse 76   Physical Exam Constitutional:      General: She is not in acute distress.    Appearance: She is  well-developed and normal weight.  Musculoskeletal:        General: No deformity.  Neurological:     Mental Status: She is alert and oriented to person, place, and time.     Cranial Nerves: No dysarthria.     Motor: Tremor present.     Coordination: Coordination normal.  Psychiatric:        Attention and Perception: Perception normal. She is inattentive. She does not perceive auditory or visual hallucinations.        Mood and Affect: Mood is anxious. Mood is not depressed. Affect is not labile, blunt, angry, tearful or inappropriate.        Speech: Speech normal.        Behavior: Behavior normal. Behavior is cooperative.        Thought Content: Thought content is not paranoid or delusional. Thought content does not include homicidal or suicidal ideation. Thought content does not include suicidal plan.        Cognition and Memory: Cognition and memory normal.        Judgment: Judgment normal.     Comments: Good insight Depression better after increase lamotrigine Lately anxiety and obs thoughts are a problem.  objectively the patient does not show significant mouth movements but she displayed video at prior visit in which typical mouth movements cheek movements associated with tardive dyskinesia was evident.  Her tremor is rhythmic and is not supportive of a tardive dyskinesia type tremor.  Lab Review:     Component Value Date/Time   NA 139 08/25/2020 1410   K 4.3 08/25/2020 1410   CL 103 08/25/2020 1410   CO2 22 08/25/2020 1410   GLUCOSE 85 08/25/2020 1410   BUN 11 08/25/2020 1410   CREATININE 0.93 08/25/2020 1410   CALCIUM 9.7 08/25/2020 1410   PROT 6.7 08/25/2020 1410   ALBUMIN 4.3 08/25/2020 1410   AST 22 08/25/2020 1410   ALT 16 08/25/2020 1410   ALKPHOS 69 08/25/2020 1410   BILITOT 0.5 08/25/2020 1410   GFRNONAA 59 (L) 08/25/2020 1410   GFRAA 69 08/25/2020 1410    No results found for: WBC, RBC, HGB, HCT, PLT, MCV, MCH, MCHC, RDW, LYMPHSABS, MONOABS, EOSABS,  BASOSABS  No results found for: POCLITH, LITHIUM   No results found for: PHENYTOIN, PHENOBARB, VALPROATE, CBMZ   She says lithium level was 0.6 at PCP on 450 mg daily.   07/2020 lithium low therapeutic by her report [facility]  Specifically her lithium level May 17, 2019 on this dosage of lithium was 0.3.  Her urinalysis was normal she had a normal CBC, comprehensive metabolic panel was normal including creatinine 0.83 calcium 9.9 and TSH was normal at 2.83.  Lithium level in April 2019 was 0.2 at this dosage.  We will not increase the lithium because she is currently having tremor at the low dose.  .res Assessment: Plan:    Depression, major, recurrent, in partial remission ([place]) - Plan: Oxcarbazepine (TRILEPTAL) 300 MG tablet, methylphenidate (RITALIN) 20 MG tablet, methylphenidate (RITALIN) 20 MG tablet, methylphenidate (RITALIN) 20 MG tablet  Tremor, essential  Generalized  anxiety disorder - Plan: Oxcarbazepine (TRILEPTAL) 300 MG tablet  Attention deficit hyperactivity disorder (ADHD), predominantly inattentive type - Plan: methylphenidate (RITALIN) 20 MG tablet, methylphenidate (RITALIN) 20 MG tablet, methylphenidate (RITALIN) 20 MG tablet  Lithium use  Lithium-induced tremor  Low serum vitamin D   .Greater than 50% of 30-minute face to face time with patient was spent on counseling and coordination of care. We discussed L[place]ra has a long history over decades of cycling major depression and treatment resistance to meds noted above of nearly every antidepressant category.   Lately more anxious and obsessive with mixed type sx.  Pt has had what she describes as mild hypomanic episodes in the past in response to antidepressants primarily.  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  Not been depressed lately.  Lowest risk mood stabilizing med should be Trileptal.  Want to try to avoid risk of triggering TD again if  possible.  SSRI would make mood cycling worse and she's done poorly on them in past.  Has been med sensitive so Start Trileptal 150 mg HS for 5 days then 300 mg HS.  If not better in a couple of weeks call.  Her best response so far has been from low-dose lithium with a stimulant but lamotrigine has seemed to help for 6 mos in 2020 until relapse in Summer.  The depression resolved again with the increase in lamotrigine from 100 to 150 mg daily.  They do not follow any pattern and do not appear to have any precipitant.   While she does not identify herself is bipolar because her upswings seem only normal to her.  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  We tried an increase lithium to 600 mg daily and check a blood level.   The increase in lithium did not help her mood and her made her tremor worse. Counseled patient regarding potential benefits, risks, and side effects of lithium to include potential risk of lithium affecting thyroid and renal function.  Discussed need for periodic lab monitoring to determine drug level and to assess for potential adverse effects.  Counseled patient regarding signs and symptoms of lithium toxicity and advised that they notify office immediately or seek urgent medical attention if experiencing these signs and symptoms.  Patient advised to contact office with any questions or concerns.    The tremor predates lithium and predates stimulant use but appears to have been made worse by the increase in lamotrigine.  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  She plans to see a neurologist.  objectively the patient does not show significant mouth movements but she displayed video since her last visit in which typical mouth movements cheek  movements associated with tardive dyskinesia was evident.  Her tremor is rhythmic and is not supportive of a tardive dyskinesia type tremor. History of essential tremor prior to lithium. Alternative primidone. Neurology concerned about balance with this one. Disc seeing neurology consult in detail. Never tried propranolol.  Could consider in the future  The increase lamotrigine to 150 mg daily resolved the depression.  it was helpful for several months at 100. Continue Ritalin as prescribed 3/4 tablet QID.  Prefers Malincroft generic. She got different brand Accord  of methylphenidate and it shatters when tries to quarter it. Continue lithium 300 mg nightly.  It appears she cannot tolerate a higher dose  Discussed potential benefits, risks, and side effects of stimulants with patient to include increased heart rate, palpitations, insomnia, increased anxiety, increased irritability, or decreased appetite.  Instructed patient to contact office if experiencing any significant tolerability issues.  Continue  Vitamin D. Disc reasons for doing so for mental and physical health in detail.  She has stopped bc she hates taking pills.  Option trazodone.   Supportive therapy dealing with GS's mental health problems.  FU 8 weeks  Lynder Parents, MD, DFAPA '   Please see After Visit Summary for patient specific instructions.  Future Appointments  Date Time Provider [place]  06/15/2021 10:00 AM [facility] [place] RM 1 [facility] None  06/15/2021 10:30 AM Princess Bruins, MD [facility] None  07/10/2021 10:30 AM Cottle, Billey Co., MD [facility] None  12/14/2021 10:30 AM Princess Bruins, MD [facility] None    No orders of the defined types were placed in this encounter.     -------------------------------

## 2021-05-17 ENCOUNTER — Telehealth: Payer: Self-pay | Admitting: Psychiatry

## 2021-05-17 NOTE — Telephone Encounter (Signed)
Absolutely she should take Trileptal and lamotrigine together.  They serve different purposes.  Trileptal (oxcarbarbazepine) is much better at preventing mood cycling and lamotrigine is better for depressiion.

## 2021-05-17 NOTE — Telephone Encounter (Signed)
Pt stated when she went to pick up rx for trileptal the pharmacists asked her was she suppose to be taking both trileptal and Lamictal.Pt wanted to make sure she should be taking these meds together

## 2021-05-17 NOTE — Telephone Encounter (Signed)
Patient lm stating she didn't receive a return call from the nurse on yesterday, so she was following up. There was not a phone encounter documented. Patient can be reached at # [phone]

## 2021-05-18 NOTE — Telephone Encounter (Signed)
Pt informed

## 2021-06-15 ENCOUNTER — Other Ambulatory Visit: Payer: Self-pay

## 2021-06-15 ENCOUNTER — Ambulatory Visit (INDEPENDENT_AMBULATORY_CARE_PROVIDER_SITE_OTHER): Payer: Medicare Other | Admitting: Obstetrics & Gynecology

## 2021-06-15 ENCOUNTER — Ambulatory Visit (INDEPENDENT_AMBULATORY_CARE_PROVIDER_SITE_OTHER): Payer: Medicare Other

## 2021-06-15 ENCOUNTER — Encounter: Payer: Self-pay | Admitting: Obstetrics & Gynecology

## 2021-06-15 VITALS — BP 110/74

## 2021-06-15 DIAGNOSIS — N83202 Unspecified ovarian cyst, left side: Secondary | ICD-10-CM

## 2021-06-15 NOTE — Progress Notes (Signed)
    Katrina Gamble [birth year] [id number]        80 y.o.  G3P3003   RP: Left Ovarian Cysts for Pelvic US  HPI:  No change x Annual Gyn visit in 11/2020.  No current pelvic pain.  Postmenopausal, well on no HRT.  No PMB.  No pelvic pain.  H/O Benign appearing Lt Ovarian Cysts stable on Pelvic US from 12/2019 to 06/2020.  She was having LLQ pain at that time and a few weeks after coming here she was worked up further and found to have diverticulitis.    OB History  Gravida Para Term Preterm AB Living  3 3 3     3   SAB IAB Ectopic Multiple Live Births               # Outcome Date GA Lbr Len/2nd Weight Sex Delivery Anes PTL Lv  3 Term           2 Term           1 Term             Past medical history,surgical history, problem list, medications, allergies, family history and social history were all reviewed and documented in the EPIC chart.   Directed ROS with pertinent positives and negatives documented in the history of present illness/assessment and plan.  Exam:  Vitals:   06/15/21 1038  BP: 110/74   General appearance:  Normal  Pelvic US today: Comparison is made with previous scan June 16, 2020.  T/V images.  Nonlatex probe cover used.  Small anteverted uterus with no myometrial mass seen.  The overall uterine size is measured at 4.91 x 3.12 x 2.13 cm.  Small amount of fluid in the uterine cavity outlining a thin symmetrical wall measured at 1.56 mm.  Similar appearance to previous scan.  No mass or thickening seen.  Right ovary is small with atrophic appearance.  Left ovary with no change in size or appearance of simple cystic structures seen previously.  No free fluid in the pelvis.   Assessment/Plan:  80 y.o. G3P3003   1. Cyst of left ovary Pelvic US findings reviewed.  Stable again.  Follicle remnants.  Will observe and repeat a pelvic US only if develops symptoms which would need investigation.  Other orders - levothyroxine (SYNTHROID) 50 MCG tablet; Take 50 mcg  by mouth daily.   Princess Bruins MD, 10:48 AM 06/15/2021

## 2021-06-16 DIAGNOSIS — Z23 Encounter for immunization: Secondary | ICD-10-CM | POA: Diagnosis not present

## 2021-06-18 ENCOUNTER — Encounter: Payer: Self-pay | Admitting: Obstetrics & Gynecology

## 2021-06-30 ENCOUNTER — Other Ambulatory Visit: Payer: Self-pay | Admitting: Obstetrics & Gynecology

## 2021-06-30 DIAGNOSIS — Z1231 Encounter for screening mammogram for malignant neoplasm of breast: Secondary | ICD-10-CM

## 2021-07-10 ENCOUNTER — Other Ambulatory Visit: Payer: Self-pay

## 2021-07-10 ENCOUNTER — Ambulatory Visit (INDEPENDENT_AMBULATORY_CARE_PROVIDER_SITE_OTHER): Payer: Medicare Other | Admitting: Psychiatry

## 2021-07-10 ENCOUNTER — Encounter: Payer: Self-pay | Admitting: Psychiatry

## 2021-07-10 VITALS — BP 154/79 | HR 75

## 2021-07-10 DIAGNOSIS — F900 Attention-deficit hyperactivity disorder, predominantly inattentive type: Secondary | ICD-10-CM | POA: Diagnosis not present

## 2021-07-10 DIAGNOSIS — G250 Essential tremor: Secondary | ICD-10-CM

## 2021-07-10 DIAGNOSIS — F411 Generalized anxiety disorder: Secondary | ICD-10-CM | POA: Diagnosis not present

## 2021-07-10 DIAGNOSIS — Z79899 Other long term (current) drug therapy: Secondary | ICD-10-CM | POA: Diagnosis not present

## 2021-07-10 DIAGNOSIS — R7989 Other specified abnormal findings of blood chemistry: Secondary | ICD-10-CM

## 2021-07-10 DIAGNOSIS — F332 Major depressive disorder, recurrent severe without psychotic features: Secondary | ICD-10-CM

## 2021-07-10 DIAGNOSIS — F3341 Major depressive disorder, recurrent, in partial remission: Secondary | ICD-10-CM

## 2021-07-10 MED ORDER — OXCARBAZEPINE 300 MG PO TABS: 300.0000 mg | ORAL_TABLET | Freq: Every evening | ORAL | 1 refills | Status: DC

## 2021-07-10 MED ORDER — LAMOTRIGINE 150 MG PO TABS: 150.0000 mg | ORAL_TABLET | Freq: Every day | ORAL | 1 refills | Status: DC

## 2021-07-10 MED ORDER — METHYLPHENIDATE HCL 20 MG PO TABS: 20.0000 mg | ORAL_TABLET | Freq: Three times a day (TID) | ORAL | 0 refills | Status: DC

## 2021-07-10 NOTE — Progress Notes (Signed)
Katrina Gamble [id number] 26-Dec-1940 80 y.o.     Subjective:   Patient ID:  Katrina Gamble is a 80 y.o. (DOB [birth year]) female.  Chief Complaint:  Chief Complaint  Patient presents with   Follow-up   Depression   Anxiety   Sleeping Problem    Depression        Associated symptoms include no decreased concentration, no fatigue and no suicidal ideas.  Past medical history includes anxiety.   Anxiety Patient reports no confusion, decreased concentration, nausea, nervous/anxious behavior, palpitations or suicidal ideas.    Medication Refill Pertinent negatives include no abdominal pain, fatigue, nausea or weakness.  [name] presents today for follow-up of ADD and mood.  When t seen May 15, 2019.  She continued to have cycles of depression without clear precipitant.  It was suggested that she increase lithium to 450 mg daily check a lithium level.  seen July 15, 2019.  She was having more depression.  The following changes were made. Rec increase lithium to 600 mg daily for a week and check blood level. If this fails to help or is intolerable then recommend lamotrigine trial as noted. She continued Ritalin 20 mg 3 times daily per usual  No lithium level nor BMP were received.  seen August 20 2019.  She was still dealing with depression and had not seen any improvement in depression at 600 mg versus 450 mg daily.  She did not get a blood level. Reduce lithium to 450 mg daily DT NR at 600 daily  recommend lamotrigine trial as noted.  She called back December 30 stating she had been on lamotrigine for 2 weeks and was noticing a tremor and wondered if it was related.  BC of tremor she went down again from 450 mg in lithium and had reduced it to 300 mg instead of 450 mg.  seen October 15, 2019.  The following was noted: CO  Worsening tremor intentional affecting mouse use about December 30.  Wonders about essential tremor vs TD.   Thyroid checked a  couple months ago and dose was not changed.  Tremor in head interfered with dental procedures some.  Alcohol does not help tremor and  Never has.   Essential tremor dx decades ago. Mood is better with lamotrigine 100 mg daily.  Mood more level.   Plan: No meds were changed.  She continued the following: Continue lamotrigine 100 mg daily as it was helpful. Continue Ritalin as prescribed. Continue lithium 300 mg nightly.  So far her mood is been stable with a lower dose Not using much propranolol.  01/12/2020 appointment, the following is noted: Questions about the propranolol.  Still has sig tremor that varies in intensity from mild to severe.  Not always bothered by it unless doing things with fine motor control like writing.  Has taken up to 20 mg propranolol and not more bc ? GI SE.   Mood still good.  Dep controlled.  Anxiety hard to tell bc D with recurrent breast CA. Plan no meds changed.  04/13/2020 appointment with the following noted: Propranolol with limited effect on tremor even up to 50 mg daily.  Hard time writing. Worse with guilty, anxious depressed thoughts.  Neg self talk.  Not sure if anxiety triggered neg thinking.  No change stress or meds.  Sleep is disjointed.  Plan: Increase lamotrigine to 150 mg daily as it was helpful for several months at 100. Continue Ritalin as prescribed. Continue lithium 300 mg  nightly.   05/25/2020 appointment with the following noted: After increased lamotrigine to mood overall has been very good with mood.  Bad part is tremor has gotten much worse with intention.  Wonders about neurologist. No med changes  08/24/2020 appointment with the following noted:  Disc lithium and kidney function and questions about CKD 3. Missed neuro appt bc got sick right before it.  RS for FEB.  But opening for tomorrow.  Tremor is increasing . Insomnia is worse.  Trouble going to sleep for a couple of hours.  3 nights per week. Racing thoughts all over the place.   Some are regretful thoughts from the past but random things.  Don't have the thoughts in the day. No naps. Cut caffeine max 2 cups coffee as late as 11 AM. Ritalin as late as 630 pm and usually that won't bother sleep.  Kids think her ADD meds aren't working well.  Scattered and hard to stay on task.   Not hyper spending or otherwise hyper.  Always tendency to talk if someone to listen. Can internally feel tense.  Not irritable. Doesn't want more meds. Pretty good with depression. Plan: The increase lamotrigine to 150 mg daily resolved the depression.  it was helpful for several months at 100. Continue Ritalin as prescribed 3/4 tablet QID.  Prefers Malincroft generic. She got different brand Accord of methylphenidate and it shatters when tries to quarter it. Continue lithium 300 mg nightly.  Agree with her plans to see neurology.  11/22/2020 appointment with following noted: Neurology diagnosed essential tremor with possible exacerbation by lithium and anxiety and suggested at the moment to withhold treatment for fear of worsening her balance and because the tremor was rather mild on exam.  MRI showed age-appropriate moderate chronic white matter disease. Pt doesn't think her tremor is mild as did neuro.  Worse with writing.  Disc neuro consult.  Disc propranolol dosing and risk of depression at higher dose.  Tremor noticeable in AM before coffee and affects writing and typing. Depression is under control.  Anxiety a little worse and experienced physically.  If get anxious then it builds on itself. Denies appetite disturbance.  Patient reports that energy and motivation have been good.  Patient denies any difficulty with concentration. Patient denies any suicidal ideation. Plan: Retry propranolol per her request 40 mg twice daily for a few days and if no benefit then increase to 50 mg twice daily for a few days. Can increase every few days by 10 mg to a max of 80 mg twice daily as long as there  are no side effects and you monitor blood pressure and pulse without significant changes or problems. The increase lamotrigine to 150 mg daily resolved the depression.  it was helpful for several months at 100. Continue Ritalin as prescribed 3/4 tablet QID.  Prefers Malincroft generic. She got different brand Accord of methylphenidate and it shatters when tries to quarter it. Continue lithium 300 mg nightly.   01/25/2021 appointment with the following noted: Difficult time adjusting to and fearful of changing meds.  Had number of stressors.   Since here # stressors.  GS rehab alcohol and bipolar disorder.   Recognizes pattern of avoidance in her life.  Plans to still try the propranolol bc the tremor is better. Patient reports stable mood and denies depressed or irritable moods.   Patient denies difficulty with sleep initiation or maintenance. Denies appetite disturbance.  Patient reports that energy and motivation have been good.  Patient  denies any difficulty with concentration.  Patient denies any suicidal ideation.  Consistent with meds. Pllan: Retry propranolol when she feel ready per her request 40 mg twice daily for a few days and if no benefit then increase to 50 mg twice daily for a few days. Can increase every few days by 10 mg to a max of 80 mg twice daily as long as there are no side effects and you monitor blood pressure and pulse without significant changes or problems. It can help both tremor and anxiety. The increase lamotrigine to 150 mg daily resolved the depression.  it was helpful for several months at 100. Continue Ritalin as prescribed 3/4 tablet QID.  Prefers Malincroft generic. She got different brand Accord of methylphenidate and it shatters when tries to quarter it. Continue lithium 300 mg nightly.   05/11/2021 appointment with the following noted: Continues lamotrigine 150 mg daily, lithium 300 mg daily, Ritalin 20 mg 3 times daily.  Has not been taking propranolol which  was prescribed last visit for tremor and anxiety.  She never tried it.  She didn't feel it was bad enough to warrant treatment.  Tremor affects use of mouse.  Affects writing.   Has recurrence of obsessive thinking and trouble sleeping for a couple of mos.  Obs thoughts on things she's done or not done and that she's responsible for something bad that might happen..  Obs interfere with sleep and are worse.  Last night after 330 before asleep and awake 730.  Never nap and not really tired in the daytime.   Periods of several days in a row of Energizer bunny and then resolves. Plan: Start Trileptal 150 mg HS for 5 days then 300 mg HS.  If not better in a couple of weeks call.  07/10/2021 appointment with the following noted: Disc BP this am.  Not checking it at home.  Is having some back and leg pain today which might increase BP. Really helped anxiety with Trileptal 300 mg nightly.  Choose to go to sleep late.   Avg 4-7 hours sleep. Patient reports stable mood and denies depressed or irritable moods.  Patient denies any recent difficulty with anxiety.  Patient denies difficulty with sleep initiation or maintenance. Denies appetite disturbance.  Patient reports that energy and motivation have been good.  Patient denies any difficulty with concentration.  Patient denies any suicidal ideation. SE always kind of clutsy but may be a little more.  No balance issues in the day.  Holter monitor occ SVT and rare VT but not requiring treatment  H died 4 years ago  Youngest D relapse breast CA.  Stressful.  New great grand D [place].  Psych med history:  Abilify no response,  Mellaril,  Refuses Seroquel bc F problems with it.  Nardil hyper and goofy, Prozac with side effects, try cyclic antidepressants, Viibryd, Trintellix caused anxiety,  Wellbutrin which caused tremors and goofy which Lexapro toned down, imipramine, Pristiq 100,  Vyvanse, Cerefolin NAC, Ritalin, history of Dexedrine during pregnancy which  caused weight gain,  lithium 600 tremor worse (started lithium 150 mg Nov 2018) Lamotrigine 150 Propranolol 50 without helpf for tremor  [facility] hosp for obs on dangerousness irrationally about water in a vase. and panic at [facility] and was on Nardil at the time. F took Seroquel for hallucinations with PD and SE  Review of Systems:  Review of Systems  Constitutional:  Negative for fatigue.  Cardiovascular:  Negative for palpitations.  Gastrointestinal:  Negative  for abdominal pain and nausea.  Neurological:  Positive for tremors. Negative for weakness.       Tremor worse at lithium 600 mg daily versus lithium 450 mg daily  Psychiatric/Behavioral:  Positive for dysphoric mood and sleep disturbance. Negative for agitation, behavioral problems, confusion, decreased concentration, hallucinations, self-injury and suicidal ideas. The patient is not nervous/anxious and is not hyperactive.    Medications: I have reviewed the patient's current medications.  Current Outpatient Medications  Medication Sig Dispense Refill   levothyroxine (SYNTHROID) 50 MCG tablet Take 50 mcg by mouth daily.     lithium carbonate 300 MG capsule Take 1 capsule (300 mg total) by mouth daily. 90 capsule 3   lamoTRIgine (LAMICTAL) 150 MG tablet Take 1 tablet (150 mg total) by mouth daily. 90 tablet 1   methylphenidate (RITALIN) 20 MG tablet Take 1 tablet (20 mg total) by mouth 3 (three) times daily with meals. 270 tablet 0   Oxcarbazepine (TRILEPTAL) 300 MG tablet Take 1 tablet (300 mg total) by mouth at bedtime. 90 tablet 1   No current facility-administered medications for this visit.    Medication Side Effects: tremor is affecting handwriting and computer worse with 600 mg lithium..  Numbness in toes.  Hands and feet always cold.  Allergies:  Allergies  Allergen Reactions   Codeine Other (See Comments)    Abdominal pain    Latex Hives   Septra [Sulfamethoxazole-Trimethoprim] Rash   Sulfa Antibiotics  Rash and Other (See Comments)    Past Medical History:  Diagnosis Date   ADD (attention deficit disorder)    Depression    Diverticulitis    Hypothyroid    d/t lithium   Osteopenia 11/2017   T score -1.1 FRAX 24% / 14%   Peripheral neuropathy    Tremor, essential    Vitamin D deficiency 12/2017    Family History  Problem Relation Age of Onset   Hypertension Mother    Thyroid disease Mother    Breast cancer Mother 66   Breast cancer Daughter 77   Thyroid disease Daughter    Other Father        Brain tumor   Parkinson's disease Father    Dementia Father    Pneumonia Father    Cancer Daughter        Melanoma   Heart disease Paternal Grandfather    Leukemia Maternal Grandmother    Stomach cancer Paternal Grandmother     Social History   Socioeconomic History   Marital status: Widowed    Spouse name: Not on file   Number of children: 3   Years of education: Not on file   Highest education level: Bachelor's degree (e.g., BA, AB, BS)  Occupational History    Comment: retired  Tobacco Use   Smoking status: Former    Packs/day: 0.50    Years: 30.00    Pack years: 15.00    Types: Cigarettes    Quit date: 2012    Years since quitting: 10.8   Smokeless tobacco: Never  Vaping Use   Vaping Use: Never used  Substance and Sexual Activity   Alcohol use: Yes    Comment: 4-5 a week   Drug use: Never   Sexual activity: Not Currently    Birth control/protection: Post-menopausal    Comment: 1st intercourse 80 yo-Fewer than 5 partners  Other Topics Concern   Not on file  Social History Narrative   Lives alone   Caffeine- 1 c coffee   Social Determinants  of Health   Financial Resource Strain: Not on file  Food Insecurity: Not on file  Transportation Needs: Not on file  Physical Activity: Not on file  Stress: Not on file  Social Connections: Not on file  Intimate Partner Violence: Not on file    Past Medical History, Surgical history, Social history, and Family  history were reviewed and updated as appropriate.   Please see review of systems for further details on the patient's review from today.   Objective:   Physical Exam:  BP (!) 154/79   Pulse 75   Physical Exam Constitutional:      General: She is not in acute distress.    Appearance: She is well-developed and normal weight.  Musculoskeletal:        General: No deformity.  Neurological:     Mental Status: She is alert and oriented to person, place, and time.     Cranial Nerves: No dysarthria.     Motor: Tremor present.     Coordination: Coordination normal.  Psychiatric:        Attention and Perception: Perception normal. She is inattentive. She does not perceive auditory or visual hallucinations.        Mood and Affect: Mood is anxious. Mood is not depressed. Affect is not labile, blunt, angry, tearful or inappropriate.        Speech: Speech normal.        Behavior: Behavior normal. Behavior is cooperative.        Thought Content: Thought content is not paranoid or delusional. Thought content does not include homicidal or suicidal ideation. Thought content does not include suicidal plan.        Cognition and Memory: Cognition and memory normal.        Judgment: Judgment normal.     Comments: Good insight Depression better after increase lamotrigine Lately anxiety and obs thoughts are a problem.  objectively the patient does not show significant mouth movements but she displayed video at prior visit in which typical mouth movements cheek movements associated with tardive dyskinesia was evident.  Her tremor is rhythmic and is not supportive of a tardive dyskinesia type tremor.  Lab Review:     Component Value Date/Time   NA 139 08/25/2020 1410   K 4.3 08/25/2020 1410   CL 103 08/25/2020 1410   CO2 22 08/25/2020 1410   GLUCOSE 85 08/25/2020 1410   BUN 11 08/25/2020 1410   CREATININE 0.93 08/25/2020 1410   CALCIUM 9.7 08/25/2020 1410   PROT 6.7 08/25/2020 1410   ALBUMIN 4.3  08/25/2020 1410   AST 22 08/25/2020 1410   ALT 16 08/25/2020 1410   ALKPHOS 69 08/25/2020 1410   BILITOT 0.5 08/25/2020 1410   GFRNONAA 59 (L) 08/25/2020 1410   GFRAA 69 08/25/2020 1410    No results found for: WBC, RBC, HGB, HCT, PLT, MCV, MCH, MCHC, RDW, LYMPHSABS, MONOABS, EOSABS, BASOSABS  No results found for: POCLITH, LITHIUM   No results found for: PHENYTOIN, PHENOBARB, VALPROATE, CBMZ   She says lithium level was 0.6 at PCP on 450 mg daily.   07/2020 lithium low therapeutic by her report [facility]  Specifically her lithium level May 17, 2019 on this dosage of lithium was 0.3.  Her urinalysis was normal she had a normal CBC, comprehensive metabolic panel was normal including creatinine 0.83 calcium 9.9 and TSH was normal at 2.83.  Lithium level in April 2019 was 0.2 at this dosage.  We will not increase the lithium because  she is currently having tremor at the low dose.  .res Assessment: Plan:    Depression, major, recurrent, in partial remission ([place]) - Plan: Oxcarbazepine (TRILEPTAL) 300 MG tablet, methylphenidate (RITALIN) 20 MG tablet  Attention deficit hyperactivity disorder (ADHD), predominantly inattentive type - Plan: methylphenidate (RITALIN) 20 MG tablet  Generalized anxiety disorder - Plan: Oxcarbazepine (TRILEPTAL) 300 MG tablet  Tremor, essential  Low serum vitamin D  Lithium use  Severe episode of recurrent major depressive disorder, without psychotic features ([place]) - Plan: lamoTRIgine (LAMICTAL) 150 MG tablet   .Greater than 50% of 30-minute face to face time with patient was spent on counseling and coordination of care. We discussed Khamya has a long history over decades of cycling major depression and treatment resistance to meds noted above of nearly every antidepressant category.   Lately more anxious and obsessive with mixed type sx.  Pt has had what she describes as mild hypomanic episodes in the past in response to antidepressants primarily.  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  Not been depressed lately.  Lowest risk mood stabilizing med should be Trileptal.  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  SSRI would make mood cycling worse and she's done poorly on them in past.  Benefit Trileptal for anxiety and somewhat for sleep. Cont Trileptal 300 mg HS.   Her best response so far has been from low-dose lithium with a stimulant but lamotrigine has seemed to help for 6 mos in 2020 until relapse in Summer.  The depression resolved again with the increase in lamotrigine from 100 to 150 mg daily.  They do not follow any pattern and do not appear to have any precipitant.   While she does not identify herself is bipolar because her upswings seem only normal to her.  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  We tried an increase lithium to 600 mg daily and check a blood level.   The increase in lithium did not help her mood and her made her tremor worse. Counseled patient regarding potential benefits, risks, and side effects of lithium to include potential risk of lithium affecting thyroid and renal function.  Discussed need for periodic lab monitoring to determine drug level and to assess for potential adverse effects.  Counseled patient regarding signs and symptoms of lithium toxicity and advised that they notify office immediately or seek urgent medical attention if experiencing these signs and symptoms.  Patient advised to contact office with any questions or concerns.    The tremor predates lithium and predates stimulant use but appears to have been made worse by the increase in lamotrigine.  It appears to be predominantly an  intention tremor.  She plans to see a neurologist.  objectively the patient does not show significant mouth movements but she displayed video since her last visit in which typical mouth movements cheek movements associated with tardive dyskinesia was evident.  Her tremor is rhythmic and is not supportive of a tardive dyskinesia type tremor. History of essential tremor prior to lithium. Alternative primidone. Neurology concerned about balance with this one. Disc seeing neurology consult in detail. Never tried propranolol.  Could consider in the future  The increase  lamotrigine to 150 mg daily resolved the depression.  it was helpful for several months at 100.  Continue lithium 300 mg nightly.  It appears she cannot tolerate a higher dose  Discussed potential benefits, risks, and side effects of stimulants with patient to include increased heart rate, palpitations, insomnia, increased anxiety, increased irritability, or decreased appetite.  Instructed patient to contact office if experiencing any significant tolerability issues. Pleased she got the Malincroft generic Ritalin bc easier to cut. Continue Ritalin as prescribed 3/4 tablet QID.  Prefers Malincroft generic. She got different brand Accord of methylphenidate and it shatters when tries to quarter it.  Continue  Vitamin D. Disc reasons for doing so for mental and physical health in detail.  She has stopped bc she hates taking pills.  Option trazodone. She prefers less meds.  Supportive therapy dealing with GS's mental health problems.  FU 8 weeks  Lynder Parents, MD, DFAPA '   Please see After Visit Summary for patient specific instructions.  Future Appointments  Date Time Provider [place]  08/01/2021  2:30 PM [facility] 2 [facility]  12/14/2021 10:30 AM Princess Bruins, MD [facility] None    No orders of the defined types were placed in this encounter.     -------------------------------

## 2021-08-01 ENCOUNTER — Ambulatory Visit
Admission: RE | Admit: 2021-08-01 | Discharge: 2021-08-01 | Disposition: A | Discharge status: Home / Self Care | Payer: Medicare Other | Source: Ambulatory Visit | Attending: Obstetrics & Gynecology | Admitting: Obstetrics & Gynecology

## 2021-08-01 DIAGNOSIS — Z1231 Encounter for screening mammogram for malignant neoplasm of breast: Secondary | ICD-10-CM | POA: Diagnosis not present

## 2021-08-09 DIAGNOSIS — E559 Vitamin D deficiency, unspecified: Secondary | ICD-10-CM | POA: Diagnosis not present

## 2021-08-09 DIAGNOSIS — R195 Other fecal abnormalities: Secondary | ICD-10-CM | POA: Diagnosis not present

## 2021-08-09 DIAGNOSIS — E039 Hypothyroidism, unspecified: Secondary | ICD-10-CM | POA: Diagnosis not present

## 2021-08-09 DIAGNOSIS — Z0000 Encounter for general adult medical examination without abnormal findings: Secondary | ICD-10-CM | POA: Diagnosis not present

## 2021-08-09 DIAGNOSIS — R251 Tremor, unspecified: Secondary | ICD-10-CM | POA: Diagnosis not present

## 2021-08-09 DIAGNOSIS — Z79899 Other long term (current) drug therapy: Secondary | ICD-10-CM | POA: Diagnosis not present

## 2021-08-09 DIAGNOSIS — E538 Deficiency of other specified B group vitamins: Secondary | ICD-10-CM | POA: Diagnosis not present

## 2021-09-05 DIAGNOSIS — K5900 Constipation, unspecified: Secondary | ICD-10-CM | POA: Diagnosis not present

## 2021-10-09 ENCOUNTER — Other Ambulatory Visit: Payer: Self-pay

## 2021-10-09 ENCOUNTER — Ambulatory Visit (INDEPENDENT_AMBULATORY_CARE_PROVIDER_SITE_OTHER): Payer: Medicare Other | Admitting: Psychiatry

## 2021-10-09 ENCOUNTER — Encounter: Payer: Self-pay | Admitting: Psychiatry

## 2021-10-09 VITALS — BP 139/93 | HR 88

## 2021-10-09 DIAGNOSIS — G250 Essential tremor: Secondary | ICD-10-CM | POA: Diagnosis not present

## 2021-10-09 DIAGNOSIS — R7989 Other specified abnormal findings of blood chemistry: Secondary | ICD-10-CM

## 2021-10-09 DIAGNOSIS — F3341 Major depressive disorder, recurrent, in partial remission: Secondary | ICD-10-CM | POA: Diagnosis not present

## 2021-10-09 DIAGNOSIS — G2401 Drug induced subacute dyskinesia: Secondary | ICD-10-CM | POA: Diagnosis not present

## 2021-10-09 DIAGNOSIS — F900 Attention-deficit hyperactivity disorder, predominantly inattentive type: Secondary | ICD-10-CM

## 2021-10-09 DIAGNOSIS — F411 Generalized anxiety disorder: Secondary | ICD-10-CM

## 2021-10-09 DIAGNOSIS — Z79899 Other long term (current) drug therapy: Secondary | ICD-10-CM | POA: Diagnosis not present

## 2021-10-09 DIAGNOSIS — F331 Major depressive disorder, recurrent, moderate: Secondary | ICD-10-CM | POA: Diagnosis not present

## 2021-10-09 MED ORDER — LITHIUM CARBONATE 150 MG PO CAPS: 150.0000 mg | ORAL_CAPSULE | Freq: Every day | ORAL | 0 refills | Status: DC

## 2021-10-09 NOTE — Patient Instructions (Signed)
Reduce lithium to 150 mg capsule daily Add Cerefolin NAC to help cognitive concerns

## 2021-10-09 NOTE — Progress Notes (Signed)
Katrina Gamble [id number] [birth year] 81 y.o.     Subjective:   Patient ID:  Katrina Gamble is a 81 y.o. (DOB November 21, 1940) female.  Chief Complaint:  Chief Complaint  Patient presents with   Follow-up   Depression   ADD   Other    Cognitive and tremor    Depression        Associated symptoms include no decreased concentration, no fatigue and no suicidal ideas.  Past medical history includes anxiety.   Anxiety Patient reports no confusion, decreased concentration, nausea, nervous/anxious behavior, palpitations or suicidal ideas.    Medication Refill Pertinent negatives include no abdominal pain, fatigue, nausea or weakness.  Katrina Gamble presents today for follow-up of ADD and mood.  When t seen May 15, 2019.  She continued to have cycles of depression without clear precipitant.  It was suggested that she increase lithium to 450 mg daily check a lithium level.  seen July 15, 2019.  She was having more depression.  The following changes were made. Rec increase lithium to 600 mg daily for a week and check blood level. If this fails to help or is intolerable then recommend lamotrigine trial as noted. She continued Ritalin 20 mg 3 times daily per usual  No lithium level nor BMP were received.  seen August 20 2019.  She was still dealing with depression and had not seen any improvement in depression at 600 mg versus 450 mg daily.  She did not get a blood level. Reduce lithium to 450 mg daily DT NR at 600 daily  recommend lamotrigine trial as noted.  She called back December 30 stating she had been on lamotrigine for 2 weeks and was noticing a tremor and wondered if it was related.  BC of tremor she went down again from 450 mg in lithium and had reduced it to 300 mg instead of 450 mg.  seen October 15, 2019.  The following was noted: CO  Worsening tremor intentional affecting mouse use about December 30.  Wonders about essential tremor vs TD.   Thyroid  checked a couple months ago and dose was not changed.  Tremor in head interfered with dental procedures some.  Alcohol does not help tremor and  Never has.   Essential tremor dx decades ago. Mood is better with lamotrigine 100 mg daily.  Mood more level.   Plan: No meds were changed.  She continued the following: Continue lamotrigine 100 mg daily as it was helpful. Continue Ritalin as prescribed. Continue lithium 300 mg nightly.  So far her mood is been stable with a lower dose Not using much propranolol.  01/12/2020 appointment, the following is noted: Questions about the propranolol.  Still has sig tremor that varies in intensity from mild to severe.  Not always bothered by it unless doing things with fine motor control like writing.  Has taken up to 20 mg propranolol and not more bc ? GI SE.   Mood still good.  Dep controlled.  Anxiety hard to tell bc D with recurrent breast CA. Plan no meds changed.  04/13/2020 appointment with the following noted: Propranolol with limited effect on tremor even up to 50 mg daily.  Hard time writing. Worse with guilty, anxious depressed thoughts.  Neg self talk.  Not sure if anxiety triggered neg thinking.  No change stress or meds.  Sleep is disjointed.  Plan: Increase lamotrigine to 150 mg daily as it was helpful for several months at 100. Continue Ritalin as  prescribed. Continue lithium 300 mg nightly.   05/25/2020 appointment with the following noted: After increased lamotrigine to mood overall has been very good with mood.  Bad part is tremor has gotten much worse with intention.  Wonders about neurologist. No med changes  08/24/2020 appointment with the following noted:  Disc lithium and kidney function and questions about CKD 3. Missed neuro appt bc got sick right before it.  RS for FEB.  But opening for tomorrow.  Tremor is increasing . Insomnia is worse.  Trouble going to sleep for a couple of hours.  3 nights per week. Racing thoughts all over  the place.  Some are regretful thoughts from the past but random things.  Don't have the thoughts in the day. No naps. Cut caffeine max 2 cups coffee as late as 11 AM. Ritalin as late as 630 pm and usually that won't bother sleep.  Kids think her ADD meds aren't working well.  Scattered and hard to stay on task.   Not hyper spending or otherwise hyper.  Always tendency to talk if someone to listen. Can internally feel tense.  Not irritable. Doesn't want more meds. Pretty good with depression. Plan: The increase lamotrigine to 150 mg daily resolved the depression.  it was helpful for several months at 100. Continue Ritalin as prescribed 3/4 tablet QID.  Prefers Malincroft generic. She got different brand Accord of methylphenidate and it shatters when tries to quarter it. Continue lithium 300 mg nightly.  Agree with her plans to see neurology.  11/22/2020 appointment with following noted: Neurology diagnosed essential tremor with possible exacerbation by lithium and anxiety and suggested at the moment to withhold treatment for fear of worsening her balance and because the tremor was rather mild on exam.  MRI showed age-appropriate moderate chronic white matter disease. Pt doesn't think her tremor is mild as did neuro.  Worse with writing.  Disc neuro consult.  Disc propranolol dosing and risk of depression at higher dose.  Tremor noticeable in AM before coffee and affects writing and typing. Depression is under control.  Anxiety a little worse and experienced physically.  If get anxious then it builds on itself. Denies appetite disturbance.  Patient reports that energy and motivation have been good.  Patient denies any difficulty with concentration. Patient denies any suicidal ideation. Plan: Retry propranolol per her request 40 mg twice daily for a few days and if no benefit then increase to 50 mg twice daily for a few days. Can increase every few days by 10 mg to a max of 80 mg twice daily as long  as there are no side effects and you monitor blood pressure and pulse without significant changes or problems. The increase lamotrigine to 150 mg daily resolved the depression.  it was helpful for several months at 100. Continue Ritalin as prescribed 3/4 tablet QID.  Prefers Malincroft generic. She got different brand Accord of methylphenidate and it shatters when tries to quarter it. Continue lithium 300 mg nightly.   01/25/2021 appointment with the following noted: Difficult time adjusting to and fearful of changing meds.  Had number of stressors.   Since here # stressors.  GS rehab alcohol and bipolar disorder.   Recognizes pattern of avoidance in her life.  Plans to still try the propranolol bc the tremor is better. Patient reports stable mood and denies depressed or irritable moods.   Patient denies difficulty with sleep initiation or maintenance. Denies appetite disturbance.  Patient reports that energy and motivation  have been good.  Patient denies any difficulty with concentration.  Patient denies any suicidal ideation.  Consistent with meds. Pllan: Retry propranolol when she feel ready per her request 40 mg twice daily for a few days and if no benefit then increase to 50 mg twice daily for a few days. Can increase every few days by 10 mg to a max of 80 mg twice daily as long as there are no side effects and you monitor blood pressure and pulse without significant changes or problems. It can help both tremor and anxiety. The increase lamotrigine to 150 mg daily resolved the depression.  it was helpful for several months at 100. Continue Ritalin as prescribed 3/4 tablet QID.  Prefers Malincroft generic. She got different brand Accord of methylphenidate and it shatters when tries to quarter it. Continue lithium 300 mg nightly.   05/11/2021 appointment with the following noted: Continues lamotrigine 150 mg daily, lithium 300 mg daily, Ritalin 20 mg 3 times daily.  Has not been taking  propranolol which was prescribed last visit for tremor and anxiety.  She never tried it.  She didn't feel it was bad enough to warrant treatment.  Tremor affects use of mouse.  Affects writing.   Has recurrence of obsessive thinking and trouble sleeping for a couple of mos.  Obs thoughts on things she's done or not done and that she's responsible for something bad that might happen..  Obs interfere with sleep and are worse.  Last night after 330 before asleep and awake 730.  Never nap and not really tired in the daytime.   Periods of several days in a row of Energizer bunny and then resolves. Plan: Start Trileptal 150 mg HS for 5 days then 300 mg HS.  If not better in a couple of weeks call.  07/10/2021 appointment with the following noted: Disc BP this am.  Not checking it at home.  Is having some back and leg pain today which might increase BP. Really helped anxiety with Trileptal 300 mg nightly.  Choose to go to sleep late.   Avg 4-7 hours sleep. Patient reports stable mood and denies depressed or irritable moods.  Patient denies any recent difficulty with anxiety.  Patient denies difficulty with sleep initiation or maintenance. Denies appetite disturbance.  Patient reports that energy and motivation have been good.  Patient denies any difficulty with concentration.  Patient denies any suicidal ideation. SE always kind of clutsy but may be a little more.  No balance issues in the day. Plan: The increase lamotrigine to 150 mg daily resolved the depression.  it was helpful for several months at 100. Continue Trileptal 300 mg nightly Continue lithium 300 mg nightly Continue Ritalin 20 mg tablets 3/4 to 1 tablet 3 times daily  10/09/2021 appointment with the following noted: 3 issues.  Main concerns are cognitive and tremor. 1 is cognitive seems atypical like with numbers like figuring years.  Computation.   More trouble folding fitted sheets.  Thinks it is worse over 3-6 mos.   No sig trouble paying  bills or doing checking account.  Pays online Has to reread recipes over longer period of time. Not as motivated for self care and getting out of the house.  Not markedly depressed otherwise. Anxiety is still better with Trileptal, but will tend to worry over somatic concerns and think the worst. Sleep is pretty good and sleeping later than usual. Trmor is worse.  Neuro was somewhat dismissive of tremor.  Remote dx  of essential tremor Lithium is successful for mood.  Holter monitor occ SVT and rare VT but not requiring treatment  H died 4 years ago  Youngest D relapse breast CA.  Stressful.  New great grand D [place].  Psych med history:  Abilify no response,  Mellaril,  Refuses Seroquel bc F problems with it.  Nardil hyper and goofy, Prozac with side effects, try cyclic antidepressants, Viibryd, Trintellix caused anxiety,  Wellbutrin which caused tremors and goofy which Lexapro toned down, imipramine, Pristiq 100,  Vyvanse, Ritalin, history of Dexedrine during pregnancy which caused weight gain,  Cerefolin NAC,  lithium 600 tremor worse (started lithium 150 mg Nov 2018) Lamotrigine 150 Trileptal 300 helped anxiety Propranolol 50 without help for tremor  Remote hosp for obs on dangerousness irrationally about water in a vase. and panic at [facility] and was on Nardil at the time. F took Seroquel for hallucinations with PD and associated dementia and SE  Review of Systems:  Review of Systems  Constitutional:  Negative for fatigue.  Cardiovascular:  Negative for palpitations.  Gastrointestinal:  Negative for abdominal pain and nausea.  Neurological:  Positive for tremors. Negative for weakness.       Tremor worse at lithium 600 mg daily versus lithium 450 mg daily  Psychiatric/Behavioral:  Positive for sleep disturbance. Negative for agitation, behavioral problems, confusion, decreased concentration, dysphoric mood, hallucinations, self-injury and suicidal ideas. The patient is  not nervous/anxious and is not hyperactive.    Medications: I have reviewed the patient's current medications.  Current Outpatient Medications  Medication Sig Dispense Refill   lamoTRIgine (LAMICTAL) 150 MG tablet Take 1 tablet (150 mg total) by mouth daily. 90 tablet 1   levothyroxine (SYNTHROID) 50 MCG tablet Take 50 mcg by mouth daily.     methylphenidate (RITALIN) 20 MG tablet Take 1 tablet (20 mg total) by mouth 3 (three) times daily with meals. 270 tablet 0   Oxcarbazepine (TRILEPTAL) 300 MG tablet Take 1 tablet (300 mg total) by mouth at bedtime. 90 tablet 1   lithium carbonate 150 MG capsule Take 1 capsule (150 mg total) by mouth daily. 90 capsule 0   No current facility-administered medications for this visit.    Medication Side Effects: tremor is affecting handwriting and computer worse with 600 mg lithium..  Numbness in toes.  Hands and feet always cold.  Allergies:  Allergies  Allergen Reactions   Codeine Other (See Comments)    Abdominal pain    Latex Hives   Septra [Sulfamethoxazole-Trimethoprim] Rash   Sulfa Antibiotics Rash and Other (See Comments)    Past Medical History:  Diagnosis Date   ADD (attention deficit disorder)    Depression    Diverticulitis    Hypothyroid    d/t lithium   Osteopenia 11/2017   T score -1.1 FRAX 24% / 14%   Peripheral neuropathy    Tremor, essential    Vitamin D deficiency 12/2017    Family History  Problem Relation Age of Onset   Hypertension Mother    Thyroid disease Mother    Breast cancer Mother 39   Breast cancer Daughter 27   Thyroid disease Daughter    Other Father        Brain tumor   Parkinson's disease Father    Dementia Father    Pneumonia Father    Cancer Daughter        Melanoma   Heart disease Paternal Grandfather    Leukemia Maternal Grandmother    Stomach cancer  Paternal Grandmother     Social History   Socioeconomic History   Marital status: Widowed    Spouse name: Not on file   Number of  children: 3   Years of education: Not on file   Highest education level: Bachelor's degree (e.g., BA, AB, BS)  Occupational History    Comment: retired  Tobacco Use   Smoking status: Former    Packs/day: 0.50    Years: 30.00    Pack years: 15.00    Types: Cigarettes    Quit date: 2012    Years since quitting: 11.0   Smokeless tobacco: Never  Vaping Use   Vaping Use: Never used  Substance and Sexual Activity   Alcohol use: Yes    Comment: 4-5 a week   Drug use: Never   Sexual activity: Not Currently    Birth control/protection: Post-menopausal    Comment: 1st intercourse 81 yo-Fewer than 5 partners  Other Topics Concern   Not on file  Social History Narrative   Lives alone   Caffeine- 1 c coffee   Social Determinants of Health   Financial Resource Strain: Not on file  Food Insecurity: Not on file  Transportation Needs: Not on file  Physical Activity: Not on file  Stress: Not on file  Social Connections: Not on file  Intimate Partner Violence: Not on file    Past Medical History, Surgical history, Social history, and Family history were reviewed and updated as appropriate.   Please see review of systems for further details on the patient's review from today.   Objective:   Physical Exam:  BP (!) 139/93    Pulse 88   Physical Exam Constitutional:      General: She is not in acute distress.    Appearance: She is well-developed and normal weight.  Musculoskeletal:        General: No deformity.  Neurological:     Mental Status: She is alert and oriented to person, place, and time.     Cranial Nerves: No dysarthria.     Motor: Tremor present.     Coordination: Coordination normal.  Psychiatric:        Attention and Perception: Perception normal. She is inattentive. She does not perceive auditory or visual hallucinations.        Mood and Affect: Mood is anxious. Mood is not depressed. Affect is not labile, blunt, angry, tearful or inappropriate.        Speech:  Speech normal.        Behavior: Behavior normal. Behavior is cooperative.        Thought Content: Thought content is not paranoid or delusional. Thought content does not include homicidal or suicidal ideation. Thought content does not include suicidal plan.        Cognition and Memory: Cognition and memory normal.        Judgment: Judgment normal.     Comments: Good insight Depression better after increase lamotrigine Lately anxiety and obs thoughts are a problem.  objectively the patient does not show significant mouth movements but she displayed video at prior visit in which typical mouth movements cheek movements associated with tardive dyskinesia was evident.  Her tremor is rhythmic and is not supportive of a tardive dyskinesia type tremor. Relatively mild facial movements at this time.  Lab Review:     Component Value Date/Time   NA 139 08/25/2020 1410   K 4.3 08/25/2020 1410   CL 103 08/25/2020 1410   CO2 22 08/25/2020 1410  GLUCOSE 85 08/25/2020 1410   BUN 11 08/25/2020 1410   CREATININE 0.93 08/25/2020 1410   CALCIUM 9.7 08/25/2020 1410   PROT 6.7 08/25/2020 1410   ALBUMIN 4.3 08/25/2020 1410   AST 22 08/25/2020 1410   ALT 16 08/25/2020 1410   ALKPHOS 69 08/25/2020 1410   BILITOT 0.5 08/25/2020 1410   GFRNONAA 59 (L) 08/25/2020 1410   GFRAA 69 08/25/2020 1410    No results found for: WBC, RBC, HGB, HCT, PLT, MCV, MCH, MCHC, RDW, LYMPHSABS, MONOABS, EOSABS, BASOSABS  No results found for: POCLITH, LITHIUM   No results found for: PHENYTOIN, PHENOBARB, VALPROATE, CBMZ   She says lithium level was 0.6 at PCP on 450 mg daily.   07/2020 lithium low therapeutic by her report [place]  Specifically her lithium level May 17, 2019 on this dosage of lithium was 0.3.  Her urinalysis was normal she had a normal CBC, comprehensive metabolic panel was normal including creatinine 0.83 calcium 9.9 and TSH was normal at 2.83.  Lithium level in April 2019 was 0.2 at this dosage.  We  will not increase the lithium because she is currently having tremor at the low dose.  .res Assessment: Plan:    Depression, major, recurrent, in partial remission ([place])  Generalized anxiety disorder  Attention deficit hyperactivity disorder (ADHD), predominantly inattentive type  Tremor, essential  Low serum vitamin D  Tardive dyskinesia  Lithium use  Major depressive disorder, recurrent episode, moderate ([place]) - Plan: lithium carbonate 150 MG capsule   .Greater than 50% of 30-minute face to face time with patient was spent on counseling and coordination of care. We discussed Sahra has a long history over decades of cycling major depression and treatment resistance to meds noted above of nearly every antidepressant category.   Lately more anxious and obsessive with mixed type sx.  Pt has had what she describes as mild hypomanic episodes in the past in response to antidepressants primarily.  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  Not been depressed lately with increase lamotrigine and less anxiety with Trileptal. While she does not identify herself is bipolar because her upswings seem only normal to her.  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  Lowest risk mood stabilizing med should be Trileptal.  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   Benefit Trileptal for anxiety and somewhat for sleep. Cont Trileptal 300 mg HS.   Her best response so far has been from low-dose lithium with a stimulant but lamotrigine has seemed to help for 6 mos in 2020 until relapse in Summer.  The depression resolved again with the increase in lamotrigine from 100 to 150 mg daily.  They do  not follow any pattern and do not appear to have any precipitant.   Cognitive complaints but not substantially impairing at this time.  She doesn't want cognitive enhancing meds.  She might retry Cerefolin NAC.  Gave her samples  We tried an increase lithium to 600 mg daily and check a blood level.   The increase in lithium did not help her mood and her made her tremor worse. Counseled patient regarding potential benefits, risks, and side effects of lithium to include  potential risk of lithium affecting thyroid and renal function.  Discussed need for periodic lab monitoring to determine drug level and to assess for potential adverse effects.  Counseled patient regarding signs and symptoms of lithium toxicity and advised that they notify office immediately or seek urgent medical attention if experiencing these signs and symptoms.  Patient advised to contact office with any questions or concerns.    The tremor predates lithium and predates stimulant use but appears to have been made worse by the increase in lamotrigine.  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  She saw a neurologist. Does not really want to add meds for tremor.  objectively the patient does not show significant mouth movements but she displayed video since her last visit in which typical mouth movements cheek movements associated with tardive dyskinesia was evident.  Her tremor is rhythmic and is not supportive of a tardive dyskinesia type tremor. History of essential tremor prior to lithium. Alternative primidone. Neurology concerned about balance with this one.  The increase lamotrigine to 150 mg daily resolved the depression.  it was helpful for several months at 100.  On lithium 300 mg nightly.  It appears she cannot tolerate a higher dose.  Consider reduction to reduce tremor possibly given improvement from lamotrigine. She wants to try lower dose lithium 150 mg dailyl  Discussed potential benefits, risks, and side  effects of stimulants with patient to include increased heart rate, palpitations, insomnia, increased anxiety, increased irritability, or decreased appetite.  Instructed patient to contact office if experiencing any significant tolerability issues. Pleased she got the Malincroft generic Ritalin bc easier to cut. Continue Ritalin as prescribed 3/4 tablet QID.  Prefers Malincroft generic. She got different brand Accord of methylphenidate and it shatters when tries to quarter it.  Continue  Vitamin D. Disc reasons for doing so for mental and physical health in detail.  She has stopped bc she hates taking pills.  Supportive therapy dealing with GS's mental health problems.  FU 8 weeks  Lynder Parents, MD, DFAPA '   Please see After Visit Summary for patient specific instructions.  Future Appointments  Date Time Provider [place]  11/15/2021  2:45 PM Lavonna Monarch, MD [facility]  12/14/2021 10:30 AM Princess Bruins, MD [facility] None    No orders of the defined types were placed in this encounter.      -------------------------------

## 2021-11-15 ENCOUNTER — Other Ambulatory Visit: Payer: Self-pay

## 2021-11-15 ENCOUNTER — Ambulatory Visit (INDEPENDENT_AMBULATORY_CARE_PROVIDER_SITE_OTHER): Payer: Medicare Other | Admitting: Dermatology

## 2021-11-15 DIAGNOSIS — C44319 Basal cell carcinoma of skin of other parts of face: Secondary | ICD-10-CM

## 2021-11-15 DIAGNOSIS — B079 Viral wart, unspecified: Secondary | ICD-10-CM | POA: Diagnosis not present

## 2021-11-15 DIAGNOSIS — L710 Perioral dermatitis: Secondary | ICD-10-CM

## 2021-11-15 DIAGNOSIS — C44311 Basal cell carcinoma of skin of nose: Secondary | ICD-10-CM | POA: Diagnosis not present

## 2021-11-15 DIAGNOSIS — D485 Neoplasm of uncertain behavior of skin: Secondary | ICD-10-CM

## 2021-11-15 NOTE — Patient Instructions (Addendum)
Otc hydrocortisone  ? ?Will Gramig hand surgeon  ,  Dr. Wells Guiles  Tat ? ?Biopsy, Surgery (Curettage) & Surgery (Excision) Aftercare Instructions ? ?1. Okay to remove bandage in 24 hours ? ?2. Wash area with soap and water ? ?3. Apply Vaseline to area twice daily until healed (Not Neosporin) ? ?4. Okay to cover with a Band-Aid to decrease the chance of infection or prevent irritation from clothing; also it's okay to uncover lesion at home. ? ?5. Suture instructions: return to our office in 7-10 or 10-14 days for a nurse visit for suture removal. Variable healing with sutures, if pain or itching occurs call our office. It's okay to shower or bathe 24 hours after sutures are given. ? ?6. The following risks may occur after a biopsy, curettage or excision: bleeding, scarring, discoloration, recurrence, infection (redness, yellow drainage, pain or swelling). ? ?7. For questions, concerns and results call our office at [facility] before 4pm & Friday before 3pm. Biopsy results will be available in 1 week. ? ?

## 2021-11-21 ENCOUNTER — Telehealth: Payer: Self-pay | Admitting: *Deleted

## 2021-11-21 NOTE — Telephone Encounter (Signed)
Path to patient. Patient wants to go to skin surgery center to have lesion on nose treated. Will get referral and office notes sent over via proficient.  ?

## 2021-11-21 NOTE — Telephone Encounter (Signed)
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  She can schedule surgery with me for minor surgery that will cure roughly 3 out of 4 or she can schedule for Mohs surgery, a significantly larger surgery with the highest cure rate.  If she is uncertain then have her schedule with me and will discuss this. ?

## 2021-11-24 ENCOUNTER — Telehealth: Payer: Self-pay | Admitting: Psychiatry

## 2021-11-24 ENCOUNTER — Other Ambulatory Visit: Payer: Self-pay

## 2021-11-24 DIAGNOSIS — F3341 Major depressive disorder, recurrent, in partial remission: Secondary | ICD-10-CM

## 2021-11-24 DIAGNOSIS — F900 Attention-deficit hyperactivity disorder, predominantly inattentive type: Secondary | ICD-10-CM

## 2021-11-24 MED ORDER — METHYLPHENIDATE HCL 20 MG PO TABS: 20.0000 mg | ORAL_TABLET | Freq: Three times a day (TID) | ORAL | 0 refills | Status: DC

## 2021-11-24 NOTE — Telephone Encounter (Signed)
Pt called  at 4:15 pm and asked for a refill on her ritalin 20 mg . [facility]acy is [facility] on [address] ?

## 2021-11-24 NOTE — Telephone Encounter (Signed)
Pended.

## 2021-11-26 ENCOUNTER — Encounter: Payer: Self-pay | Admitting: Dermatology

## 2021-11-26 NOTE — Progress Notes (Signed)
? ?  Follow-Up Visit ?  ?Subjective  ?Katrina Gamble is a 81 y.o. female who presents for the following: Skin Problem (Pt has a spot of concern on the nose and L posterior ear. When pt blows her nose, it causes pain ). ? ?Growths on nose and behind ear.  Check other spots and rash on face ?Location:  ?Duration:  ?Quality:  ?Associated Signs/Symptoms: ?Modifying Factors:  ?Severity:  ?Timing: ?Context:  ? ?Objective  ?Well appearing patient in no apparent distress; mood and affect are within normal limits. ?Left Buccal Cheek, Right Buccal Cheek ?Mild patchy dermatitis, few micropapules. ? ?Right Ala Nasi ?Pearly 4 mm papule, dermoscopy shows vessels suggestive of BCC ? ? ? ? ? ? ?Left Postauricular Area ?Blood work is 6 mm pink papule ? ? ? ? ? ? ? ? ?All sun exposed areas plus back examined. ? ? ?Assessment & Plan  ? ? ?Perioral dermatitis ?Left Buccal Cheek; Right Buccal Cheek ? ?May try over-the-counter hydrocortisone.  Contact me if this gets worse and we may try topical metronidazole. ? ?Neoplasm of uncertain behavior of skin (2) ?Right Ala Nasi ? ?Skin / nail biopsy ?Type of biopsy: tangential   ?Informed consent: discussed and consent obtained   ?Timeout: patient name, date of birth, surgical site, and procedure verified   ?Anesthesia: the lesion was anesthetized in a standard fashion   ?Anesthetic:  1% lidocaine w/ epinephrine 1-100,000 local infiltration ?Instrument used: flexible razor blade   ?Hemostasis achieved with: ferric subsulfate and electrodesiccation   ?Outcome: patient tolerated procedure well   ?Post-procedure details: wound care instructions given   ? ?Specimen 1 - Surgical pathology ?Differential Diagnosis: bcc vs scc  ? ?Check Margins: No ? ?Left Postauricular Area ? ?Skin / nail biopsy ?Type of biopsy: tangential   ?Informed consent: discussed and consent obtained   ?Timeout: patient name, date of birth, surgical site, and procedure verified   ?Anesthesia: the lesion was anesthetized in  a standard fashion   ?Anesthetic:  1% lidocaine w/ epinephrine 1-100,000 local infiltration ?Instrument used: flexible razor blade   ?Hemostasis achieved with: ferric subsulfate   ?Outcome: patient tolerated procedure well   ?Post-procedure details: sterile dressing applied and wound care instructions given   ?Dressing type: bandage and petrolatum   ? ?Specimen 2 - Surgical pathology ?Differential Diagnosis: bcc vs scc -cautery after bx ? ?Check Margins: No ? ? ? ? ? ?I, Lavonna Monarch, MD, have reviewed all documentation for this visit.  The documentation on 11/26/21 for the exam, diagnosis, procedures, and orders are all accurate and complete. ?

## 2021-12-07 ENCOUNTER — Encounter: Payer: Self-pay | Admitting: Psychiatry

## 2021-12-07 ENCOUNTER — Ambulatory Visit (INDEPENDENT_AMBULATORY_CARE_PROVIDER_SITE_OTHER): Payer: Medicare Other | Admitting: Psychiatry

## 2021-12-07 DIAGNOSIS — F3341 Major depressive disorder, recurrent, in partial remission: Secondary | ICD-10-CM | POA: Diagnosis not present

## 2021-12-07 DIAGNOSIS — G250 Essential tremor: Secondary | ICD-10-CM | POA: Diagnosis not present

## 2021-12-07 DIAGNOSIS — F411 Generalized anxiety disorder: Secondary | ICD-10-CM

## 2021-12-07 DIAGNOSIS — R7989 Other specified abnormal findings of blood chemistry: Secondary | ICD-10-CM | POA: Diagnosis not present

## 2021-12-07 DIAGNOSIS — G2401 Drug induced subacute dyskinesia: Secondary | ICD-10-CM

## 2021-12-07 DIAGNOSIS — G251 Drug-induced tremor: Secondary | ICD-10-CM

## 2021-12-07 DIAGNOSIS — Z79899 Other long term (current) drug therapy: Secondary | ICD-10-CM

## 2021-12-07 DIAGNOSIS — F900 Attention-deficit hyperactivity disorder, predominantly inattentive type: Secondary | ICD-10-CM

## 2021-12-07 DIAGNOSIS — F331 Major depressive disorder, recurrent, moderate: Secondary | ICD-10-CM

## 2021-12-07 MED ORDER — METHYLPHENIDATE HCL 20 MG PO TABS: 20.0000 mg | ORAL_TABLET | Freq: Three times a day (TID) | ORAL | 0 refills | Status: DC

## 2021-12-07 MED ORDER — LITHIUM CARBONATE 300 MG PO CAPS: 300.0000 mg | ORAL_CAPSULE | Freq: Every day | ORAL | 1 refills | Status: DC

## 2021-12-07 NOTE — Progress Notes (Signed)
Katrina Gamble ?[id number] ?[birth year] ?81 y.o.  ? ? ? ?Subjective:  ? ?Patient ID:  Katrina Gamble is a 81 y.o. (DOB 08-20-1941) female. ? ?Chief Complaint:  ?Chief Complaint  ?Patient presents with  ? Follow-up  ? ADD  ? Depression  ? Anxiety  ? ? ?Depression ?       Associated symptoms include no decreased concentration, no fatigue and no suicidal ideas.  Past medical history includes anxiety.   ?Anxiety ?Patient reports no confusion, decreased concentration, nausea, nervous/anxious behavior, palpitations or suicidal ideas.  ? ? ?Medication Refill ?Pertinent negatives include no abdominal pain, fatigue, nausea or weakness.  ?Katrina Gamble presents today for follow-up of ADD and mood. ? ?When t seen May 15, 2019.  She continued to have cycles of depression without clear precipitant.  It was suggested that she increase lithium to 450 mg daily check a lithium level. ? ?seen July 15, 2019.  She was having more depression.  The following changes were made. ?Rec increase lithium to 600 mg daily for a week and check blood level. ?If this fails to help or is intolerable then recommend lamotrigine trial as noted. ?She continued Ritalin 20 mg 3 times daily per usual ? No lithium level nor BMP were received. ? ?seen August 20 2019.  She was still dealing with depression and had not seen any improvement in depression at 600 mg versus 450 mg daily.  She did not get a blood level. ?Reduce lithium to 450 mg daily DT NR at 600 daily ? recommend lamotrigine trial as noted. ? ?She called back December 30 stating she had been on lamotrigine for 2 weeks and was noticing a tremor and wondered if it was related.  BC of tremor she went down again from 450 mg in lithium and had reduced it to 300 mg instead of 450 mg. ? ?seen October 15, 2019.  The following was noted: ?CO  Worsening tremor intentional affecting mouse use about December 30.  Wonders about essential tremor vs TD.   ?Thyroid checked a couple  months ago and dose was not changed.  Tremor in head interfered with dental procedures some.  Alcohol does not help tremor and  Never has.   ?Essential tremor dx decades ago. ?Mood is better with lamotrigine 100 mg daily.  Mood more level.   ?Plan: No meds were changed.  She continued the following: ?Continue lamotrigine 100 mg daily as it was helpful. ?Continue Ritalin as prescribed. ?Continue lithium 300 mg nightly.  So far her mood is been stable with a lower dose ?Not using much propranolol. ? ?01/12/2020 appointment, the following is noted: ?Questions about the propranolol.  Still has sig tremor that varies in intensity from mild to severe.  Not always bothered by it unless doing things with fine motor control like writing.  Has taken up to 20 mg propranolol and not more bc ? GI SE.   ?Mood still good.  Dep controlled.  Anxiety hard to tell bc D with recurrent breast CA. ?Plan no meds changed. ? ?04/13/2020 appointment with the following noted: ?Propranolol with limited effect on tremor even up to 50 mg daily.  Hard time writing. ?Worse with guilty, anxious depressed thoughts.  Neg self talk.  Not sure if anxiety triggered neg thinking.  No change stress or meds.  Sleep is disjointed.  ?Plan: Increase lamotrigine to 150 mg daily as it was helpful for several months at 100. ?Continue Ritalin as prescribed. ?Continue lithium 300 mg nightly.  ? ?  05/25/2020 appointment with the following noted: ?After increased lamotrigine to mood overall has been very good with mood.  ?Bad part is tremor has gotten much worse with intention.  Wonders about neurologist. ?No med changes ? ?08/24/2020 appointment with the following noted: ? Disc lithium and kidney function and questions about CKD 3. ?Missed neuro appt bc got sick right before it.  RS for FEB.  But opening for tomorrow.  Tremor is increasing . ?Insomnia is worse.  Trouble going to sleep for a couple of hours.  3 nights per week. ?Racing thoughts all over the place.  Some  are regretful thoughts from the past but random things.  Don't have the thoughts in the day. No naps. ?Cut caffeine max 2 cups coffee as late as 11 AM. ?Ritalin as late as 630 pm and usually that won't bother sleep.  Kids think her ADD meds aren't working well.  Scattered and hard to stay on task.   ?Not hyper spending or otherwise hyper.  Always tendency to talk if someone to listen. ?Can internally feel tense.  Not irritable. ?Doesn't want more meds. ?Pretty good with depression. ?Plan: The increase lamotrigine to 150 mg daily resolved the depression.  it was helpful for several months at 100. ?Continue Ritalin as prescribed 3/4 tablet QID.  Prefers [employer]ro[employer] generic. ?She got different brand Accord of methylphenidate and it shatters when tries to quarter it. ?Continue lithium 300 mg nightly.  ?Agree with her plans to see neurology. ? ?11/22/2020 appointment with following noted: ?Neurology diagnosed essential tremor with possible exacerbation by lithium and anxiety and suggested at the moment to withhold treatment for fear of worsening her balance and because the tremor was rather mild on exam.  MRI showed age-appropriate moderate chronic white matter disease. ?Pt doesn't think her tremor is mild as did neuro.  Worse with writing.  Disc neuro consult.  Disc propranolol dosing and risk of depression at higher dose.  ?Tremor noticeable in AM before coffee and affects writing and typing. ?Depression is under control.  Anxiety a little worse and experienced physically.  If get anxious then it builds on itself. ?Denies appetite disturbance.  Patient reports that energy and motivation have been good.  Patient denies any difficulty with concentration. Patient denies any suicidal ideation. ?Plan: Retry propranolol per her request 40 mg twice daily for a few days and if no benefit then increase to 50 mg twice daily for a few days. ?Can increase every few days by 10 mg to a max of 80 mg twice daily as long as there are no  side effects and you monitor blood pressure and pulse without significant changes or problems. ?The increase lamotrigine to 150 mg daily resolved the depression.  it was helpful for several months at 100. ?Continue Ritalin as prescribed 3/4 tablet QID.  Prefers Mal[employer]roft generic. ?She got different brand Accord of methylphenidate and it shatters when tries to quarter it. ?Continue lithium 300 mg nightly.  ? ?01/25/2021 appointment with the following noted: ?Difficult time adjusting to and fearful of changing meds.  Had number of stressors.   ?Since here # stressors.  [facility] rehab alcohol and bipolar disorder.   ?Recognizes pattern of avoidance in her life.  Plans to still try the propranolol bc the tremor is better. ?Patient reports stable mood and denies depressed or irritable moods.   Patient denies difficulty with sleep initiation or maintenance. Denies appetite disturbance.  Patient reports that energy and motivation have been good.  Patient denies any difficulty  with concentration.  Patient denies any suicidal ideation. ? Consistent with meds. ?Pllan: Retry propranolol when she feel ready per her request 40 mg twice daily for a few days and if no benefit then increase to 50 mg twice daily for a few days. ?Can increase every few days by 10 mg to a max of 80 mg twice daily as long as there are no side effects and you monitor blood pressure and pulse without significant changes or problems. ?It can help both tremor and anxiety. ?The increase lamotrigine to 150 mg daily resolved the depression.  it was helpful for several months at 100. ?Continue Ritalin as prescribed 3/4 tablet QID.  Prefers Mal[employer]roft generic. ?She got different brand Accord of methylphenidate and it shatters when tries to quarter it. ?Continue lithium 300 mg nightly.  ? ?05/11/2021 appointment with the following noted: ?Continues lamotrigine 150 mg daily, lithium 300 mg daily, Ritalin 20 mg 3 times daily.  Has not been taking propranolol which was  prescribed last visit for tremor and anxiety.  She never tried it.  She didn't feel it was bad enough to warrant treatment.  Tremor affects use of mouse.  Affects writing.   ?Has recurrence of obsessive thinki

## 2021-12-07 NOTE — Patient Instructions (Signed)
NAC ( N-Acetylcysteine) 1 or 2 daily of the 600 mg capsules daily to help with mild cognitive problems.  It can be combined with a B-complex vitamin as the B-12 and folate have been shown to sometimes enhance the effect. ? ?

## 2021-12-11 ENCOUNTER — Telehealth: Payer: Self-pay | Admitting: Dermatology

## 2021-12-11 NOTE — Telephone Encounter (Signed)
1) Says she hasn't heard from [facility] yet about an appointment and it's been over 2 weeks ?

## 2021-12-11 NOTE — Telephone Encounter (Signed)
Patient was given [facility] number [phone] per exchange she has a consult date 02/15/22 8:00 ?

## 2021-12-14 ENCOUNTER — Encounter: Payer: Self-pay | Admitting: Obstetrics & Gynecology

## 2021-12-14 ENCOUNTER — Ambulatory Visit (INDEPENDENT_AMBULATORY_CARE_PROVIDER_SITE_OTHER): Payer: Medicare Other | Admitting: Obstetrics & Gynecology

## 2021-12-14 VITALS — BP 120/76 | HR 68 | Resp 16 | Ht 64.25 in | Wt 137.0 lb

## 2021-12-14 DIAGNOSIS — Z9189 Other specified personal risk factors, not elsewhere classified: Secondary | ICD-10-CM

## 2021-12-14 DIAGNOSIS — Z780 Asymptomatic menopausal state: Secondary | ICD-10-CM

## 2021-12-14 DIAGNOSIS — Z01419 Encounter for gynecological examination (general) (routine) without abnormal findings: Secondary | ICD-10-CM

## 2021-12-14 NOTE — Progress Notes (Signed)
? ? ?Katrina Gamble September 21, 1940 [id number] ? ? ?History:    81 y.o. G3P3L3 ?  ?RP: Established patient presenting for annual gyn exam  ?  ?HPI: Postmenopausal, well on no HRT.  No PMB.  No pelvic pain. Abstinent.  No h/o abnormal Pap, no need to repeat. H/O diverticulitis followed by Gertie Fey.  Urine normal.  Breasts normal. Mammo Neg 07/2021. BMI 23.33.  Enjoys gardening. BD normal in 11/2019.  Health labs with Fam MD.  [place] 2010. ? ? ?Past medical history,surgical history, family history and social history were all reviewed and documented in the EPIC chart. ? ?Gynecologic History ?No LMP recorded. Patient is postmenopausal. ? ?Obstetric History ?OB History  ?Gravida Para Term Preterm AB Living  ?'3 3 3     3  '$ ?SAB IAB Ectopic Multiple Live Births  ?        3  ?  ?# Outcome Date GA Lbr Len/2nd Weight Sex Delivery Anes PTL Lv  ?3 Term           ?2 Term           ?1 Term           ? ? ? ?ROS: A ROS was performed and pertinent positives and negatives are included in the history. ? GENERAL: No fevers or chills. HEENT: No change in vision, no earache, sore throat or sinus congestion. NECK: No pain or stiffness. CARDIOVASCULAR: No chest pain or pressure. No palpitations. PULMONARY: No shortness of breath, cough or wheeze. GASTROINTESTINAL: No abdominal pain, nausea, vomiting or diarrhea, melena or bright red blood per rectum. GENITOURINARY: No urinary frequency, urgency, hesitancy or dysuria. MUSCULOSKELETAL: No joint or muscle pain, no back pain, no recent trauma. DERMATOLOGIC: No rash, no itching, no lesions. ENDOCRINE: No polyuria, polydipsia, no heat or cold intolerance. No recent change in weight. HEMATOLOGICAL: No anemia or easy bruising or bleeding. NEUROLOGIC: No headache, seizures, numbness, tingling or weakness. PSYCHIATRIC: No depression, no loss of interest in normal activity or change in sleep pattern.  ?  ? ?Exam: ? ? ?BP 120/76   Pulse 68   Resp 16   Ht 5' 4.25" (1.632 m)   Wt 137 lb (62.1 kg)   BMI  23.33 kg/m?  ? ?Body mass index is 23.33 kg/m?. ? ?General appearance : Well developed well nourished female. No acute distress ?HEENT: Eyes: no retinal hemorrhage or exudates,  Neck supple, trachea midline, no carotid bruits, no thyroidmegaly ?Lungs: Clear to auscultation, no rhonchi or wheezes, or rib retractions  ?Heart: Regular rate and rhythm, no murmurs or gallops ?Breast:Examined in sitting and supine position were symmetrical in appearance, no palpable masses or tenderness,  no skin retraction, no nipple inversion, no nipple discharge, no skin discoloration, no axillary or supraclavicular lymphadenopathy ?Abdomen: no palpable masses or tenderness, no rebound or guarding ?Extremities: no edema or skin discoloration or tenderness ? ?Pelvic: Vulva: Normal ?            Vagina: No gross lesions or discharge ? Cervix: No gross lesions or discharge ? Uterus  AV, normal size, shape and consistency, non-tender and mobile ? Adnexa  Without masses or tenderness ? Anus: Normal ? ? ?Assessment/Plan:  81 y.o. female for annual exam  ? ?1. Well female exam with routine gynecological exam ?Postmenopausal, well on no HRT.  No PMB.  No pelvic pain. Abstinent.  No h/o abnormal Pap, no need to repeat. H/O diverticulitis followed by Gertie Fey.  Urine normal.  Breasts normal. Mammo Neg 07/2021.  BMI 23.33.  Enjoys gardening. BD normal in 11/2019.  Health labs with Fam MD.  [place] 2010. ? ?2. Postmenopause ?Postmenopausal, well on no HRT.  No PMB.  No pelvic pain. Abstinent. BD normal in 11/2019. ? ?3. Other specified personal risk factors, not elsewhere classified  ? ?Princess Bruins MD, 10:36 AM 12/14/2021 ? ?  ?

## 2022-01-08 DIAGNOSIS — R059 Cough, unspecified: Secondary | ICD-10-CM | POA: Diagnosis not present

## 2022-01-08 DIAGNOSIS — J029 Acute pharyngitis, unspecified: Secondary | ICD-10-CM | POA: Diagnosis not present

## 2022-01-08 DIAGNOSIS — J069 Acute upper respiratory infection, unspecified: Secondary | ICD-10-CM | POA: Diagnosis not present

## 2022-01-08 DIAGNOSIS — Z20822 Contact with and (suspected) exposure to covid-19: Secondary | ICD-10-CM | POA: Diagnosis not present

## 2022-01-10 ENCOUNTER — Other Ambulatory Visit: Payer: Self-pay | Admitting: Psychiatry

## 2022-01-10 DIAGNOSIS — F3341 Major depressive disorder, recurrent, in partial remission: Secondary | ICD-10-CM

## 2022-01-10 DIAGNOSIS — F411 Generalized anxiety disorder: Secondary | ICD-10-CM

## 2022-01-10 DIAGNOSIS — F332 Major depressive disorder, recurrent severe without psychotic features: Secondary | ICD-10-CM

## 2022-02-15 DIAGNOSIS — C44311 Basal cell carcinoma of skin of nose: Secondary | ICD-10-CM | POA: Diagnosis not present

## 2022-02-28 DIAGNOSIS — H2513 Age-related nuclear cataract, bilateral: Secondary | ICD-10-CM | POA: Diagnosis not present

## 2022-03-05 ENCOUNTER — Ambulatory Visit (INDEPENDENT_AMBULATORY_CARE_PROVIDER_SITE_OTHER): Payer: Medicare Other | Admitting: Psychiatry

## 2022-03-05 ENCOUNTER — Encounter: Payer: Self-pay | Admitting: Psychiatry

## 2022-03-05 VITALS — BP 124/84 | HR 80

## 2022-03-05 DIAGNOSIS — F428 Other obsessive-compulsive disorder: Secondary | ICD-10-CM

## 2022-03-05 DIAGNOSIS — F900 Attention-deficit hyperactivity disorder, predominantly inattentive type: Secondary | ICD-10-CM

## 2022-03-05 DIAGNOSIS — F3341 Major depressive disorder, recurrent, in partial remission: Secondary | ICD-10-CM

## 2022-03-05 DIAGNOSIS — G2401 Drug induced subacute dyskinesia: Secondary | ICD-10-CM

## 2022-03-05 DIAGNOSIS — F332 Major depressive disorder, recurrent severe without psychotic features: Secondary | ICD-10-CM | POA: Diagnosis not present

## 2022-03-05 DIAGNOSIS — G250 Essential tremor: Secondary | ICD-10-CM

## 2022-03-05 DIAGNOSIS — F411 Generalized anxiety disorder: Secondary | ICD-10-CM | POA: Diagnosis not present

## 2022-03-05 DIAGNOSIS — Z79899 Other long term (current) drug therapy: Secondary | ICD-10-CM

## 2022-03-05 MED ORDER — OXCARBAZEPINE 150 MG PO TABS: 450.0000 mg | ORAL_TABLET | Freq: Every day | ORAL | 0 refills | Status: DC

## 2022-03-05 MED ORDER — LAMOTRIGINE 150 MG PO TABS: 150.0000 mg | ORAL_TABLET | Freq: Every day | ORAL | 0 refills | Status: DC

## 2022-03-05 NOTE — Progress Notes (Signed)
Katrina Gamble [id number] [birth year] 81 y.o.     Subjective:   Patient ID:  Katrina Gamble is a 81 y.o. (DOB [birth year]) female.  Chief Complaint:  Chief Complaint  Patient presents with   Follow-up   Depression   Anxiety   ADD    Depression        Associated symptoms include no decreased concentration, no fatigue and no suicidal ideas.  Past medical history includes anxiety.   Anxiety Symptoms include nervous/anxious behavior. Patient reports no confusion, decreased concentration, nausea, palpitations or suicidal ideas.    Medication Refill Pertinent negatives include no abdominal pain, fatigue, nausea or weakness.   Katrina Gamble presents today for follow-up of ADD and mood.  When t seen May 15, 2019.  Katrina Gamble continued to have cycles of depression without clear precipitant.  It was suggested that Katrina Gamble increase lithium to 450 mg daily check a lithium level.  seen July 15, 2019.  Katrina Gamble was having more depression.  The following changes were made. Rec increase lithium to 600 mg daily for a week and check blood level. If this fails to help or is intolerable then recommend lamotrigine trial as noted. Katrina Gamble continued Ritalin 20 mg 3 times daily per usual  No lithium level nor BMP were received.  seen August 20 2019.  Katrina Gamble was still dealing with depression and had not seen any improvement in depression at 600 mg versus 450 mg daily.  Katrina Gamble did not get a blood level. Reduce lithium to 450 mg daily DT NR at 600 daily  recommend lamotrigine trial as noted.  Katrina Gamble called back December 30 stating Katrina Gamble had been on lamotrigine for 2 weeks and was noticing a tremor and wondered if it was related.  BC of tremor Katrina Gamble went down again from 450 mg in lithium and had reduced it to 300 mg instead of 450 mg.  seen October 15, 2019.  The following was noted: CO  Worsening tremor intentional affecting mouse use about December 30.  Wonders about essential tremor vs TD.   Thyroid  checked a couple months ago and dose was not changed.  Tremor in head interfered with dental procedures some.  Alcohol does not help tremor and  Never has.   Essential tremor dx decades ago. Mood is better with lamotrigine 100 mg daily.  Mood more level.   Plan: No meds were changed.  Katrina Gamble continued the following: Continue lamotrigine 100 mg daily as it was helpful. Continue Ritalin as prescribed. Continue lithium 300 mg nightly.  So far her mood is been stable with a lower dose Not using much propranolol.  01/12/2020 appointment, the following is noted: Questions about the propranolol.  Still has sig tremor that varies in intensity from mild to severe.  Not always bothered by it unless doing things with fine motor control like writing.  Has taken up to 20 mg propranolol and not more bc ? GI SE.   Mood still good.  Dep controlled.  Anxiety hard to tell bc D with recurrent breast CA. Plan no meds changed.  04/13/2020 appointment with the following noted: Propranolol with limited effect on tremor even up to 50 mg daily.  Hard time writing. Worse with guilty, anxious depressed thoughts.  Neg self talk.  Not sure if anxiety triggered neg thinking.  No change stress or meds.  Sleep is disjointed.  Plan: Increase lamotrigine to 150 mg daily as it was helpful for several months at 100. Continue Ritalin as prescribed. Continue lithium  300 mg nightly.   05/25/2020 appointment with the following noted: After increased lamotrigine to mood overall has been very good with mood.  Bad part is tremor has gotten much worse with intention.  Wonders about neurologist. No med changes  08/24/2020 appointment with the following noted:  Disc lithium and kidney function and questions about CKD 3. Missed neuro appt bc got sick right before it.  RS for FEB.  But opening for tomorrow.  Tremor is increasing . Insomnia is worse.  Trouble going to sleep for a couple of hours.  3 nights per week. Racing thoughts all over  the place.  Some are regretful thoughts from the past but random things.  Don't have the thoughts in the day. No naps. Cut caffeine max 2 cups coffee as late as 11 AM. Ritalin as late as 630 pm and usually that won't bother sleep.  Kids think her ADD meds aren't working well.  Scattered and hard to stay on task.   Not hyper spending or otherwise hyper.  Always tendency to talk if someone to listen. Can internally feel tense.  Not irritable. Doesn't want more meds. Pretty good with depression. Plan: The increase lamotrigine to 150 mg daily resolved the depression.  it was helpful for several months at 100. Continue Ritalin as prescribed 3/4 tablet QID.  Prefers [facility] generic. Katrina Gamble got different brand Accord of methylphenidate and it shatters when tries to quarter it. Continue lithium 300 mg nightly.  Agree with her plans to see neurology.  11/22/2020 appointment with following noted: Neurology diagnosed essential tremor with possible exacerbation by lithium and anxiety and suggested at the moment to withhold treatment for fear of worsening her balance and because the tremor was rather mild on exam.  MRI showed age-appropriate moderate chronic white matter disease. Pt doesn't think her tremor is mild as did neuro.  Worse with writing.  Disc neuro consult.  Disc propranolol dosing and risk of depression at higher dose.  Tremor noticeable in AM before coffee and affects writing and typing. Depression is under control.  Anxiety a little worse and experienced physically.  If get anxious then it builds on itself. Denies appetite disturbance.  Patient reports that energy and motivation have been good.  Patient denies any difficulty with concentration. Patient denies any suicidal ideation. Plan: Retry propranolol per her request 40 mg twice daily for a few days and if no benefit then increase to 50 mg twice daily for a few days. Can increase every few days by 10 mg to a max of 80 mg twice daily as long  as there are no side effects and you monitor blood pressure and pulse without significant changes or problems. The increase lamotrigine to 150 mg daily resolved the depression.  it was helpful for several months at 100. Continue Ritalin as prescribed 3/4 tablet QID.  Prefers [employer] generic. Katrina Gamble got different brand Accord of methylphenidate and it shatters when tries to quarter it. Continue lithium 300 mg nightly.   01/25/2021 appointment with the following noted: Difficult time adjusting to and fearful of changing meds.  Had number of stressors.   Since here # stressors.  GS rehab alcohol and bipolar disorder.   Recognizes pattern of avoidance in her life.  Plans to still try the propranolol bc the tremor is better. Patient reports stable mood and denies depressed or irritable moods.   Patient denies difficulty with sleep initiation or maintenance. Denies appetite disturbance.  Patient reports that energy and motivation have been good.  Patient denies any difficulty with concentration.  Patient denies any suicidal ideation.  Consistent with meds. Pllan: Retry propranolol when Katrina Gamble feel ready per her request 40 mg twice daily for a few days and if no benefit then increase to 50 mg twice daily for a few days. Can increase every few days by 10 mg to a max of 80 mg twice daily as long as there are no side effects and you monitor blood pressure and pulse without significant changes or problems. It can help both tremor and anxiety. The increase lamotrigine to 150 mg daily resolved the depression.  it was helpful for several months at 100. Continue Ritalin as prescribed 3/4 tablet QID.  Prefers [employer] generic. Katrina Gamble got different brand Accord of methylphenidate and it shatters when tries to quarter it. Continue lithium 300 mg nightly.   05/11/2021 appointment with the following noted: Continues lamotrigine 150 mg daily, lithium 300 mg daily, Ritalin 20 mg 3 times daily.  Has not been taking  propranolol which was prescribed last visit for tremor and anxiety.  Katrina Gamble never tried it.  Katrina Gamble didn't feel it was bad enough to warrant treatment.  Tremor affects use of mouse.  Affects writing.   Has recurrence of obsessive thinking and trouble sleeping for a couple of mos.  Obs thoughts on things Katrina Gamble's done or not done and that Katrina Gamble's responsible for something bad that might happen..  Obs interfere with sleep and are worse.  Last night after 330 before asleep and awake 730.  Never nap and not really tired in the daytime.   Periods of several days in a row of Energizer bunny and then resolves. Plan: Start Trileptal 150 mg HS for 5 days then 300 mg HS.  If not better in a couple of weeks call.  07/10/2021 appointment with the following noted: Disc BP this am.  Not checking it at home.  Is having some back and leg pain today which might increase BP. Really helped anxiety with Trileptal 300 mg nightly.  Choose to go to sleep late.   Avg 4-7 hours sleep. Patient reports stable mood and denies depressed or irritable moods.  Patient denies any recent difficulty with anxiety.  Patient denies difficulty with sleep initiation or maintenance. Denies appetite disturbance.  Patient reports that energy and motivation have been good.  Patient denies any difficulty with concentration.  Patient denies any suicidal ideation. SE always kind of clutsy but may be a little more.  No balance issues in the day. Plan: The increase lamotrigine to 150 mg daily resolved the depression.  it was helpful for several months at 100. Continue Trileptal 300 mg nightly Continue lithium 300 mg nightly Continue Ritalin 20 mg tablets 3/4 to 1 tablet 3 times daily  10/09/2021 appointment with the following noted: 3 issues.  Main concerns are cognitive and tremor. 1 is cognitive seems atypical like with numbers like figuring years.  Computation.   More trouble folding fitted sheets.  Thinks it is worse over 3-6 mos.   No sig trouble paying  bills or doing checking account.  Pays online Has to reread recipes over longer period of time. Not as motivated for self care and getting out of the house.  Not markedly depressed otherwise. Anxiety is still better with Trileptal, but will tend to worry over somatic concerns and think the worst. Sleep is pretty good and sleeping later than usual. Trmor is worse.  Neuro was somewhat dismissive of tremor.  Remote dx of essential tremor Lithium  is successful for mood. Plan; no med changes except reduced lithium to 150 mg daily to see if tremor would be better.  Continue lamotrigine 150, Ritalin 20 mg tablets 3/4 tablets 3 times daily, oxcarbazepine 300 mg nightly,   12/07/21 appt noted: Tremor no better with less lithium. More mood cycling from depression to highs with energizer bunny to anxiety with sense of doom.   Periods of flashing or intrusive thoughts Katrina Gamble had done something wrong to new great grand child. Still prefers [employer] generic Ritalin. Plan: Katrina Gamble wanted to try lower dose lithium 150 mg daily and this did not help tremor and mood cycling is worse, so increase lithium bak to300 mg daily.  03/05/2022 appointment with the following noted: Continues lamotrigine 150 mg daily, lithium 300 mg daily, oxcarbazepine 300 mg nightly, Ritalin 20 mg 3 times daily Last still having obsessive intrusive thoughts without reason.  Katrina Gamble thinks it's related  to meds but reviewed med sequencing of changes do not provide an obvious med pcpt.   Obsessions primarily about doing something wrong that may harm others.  Can cause sleeplessness and panic feelings. Having insomnia initially with anxious thoughts.    No depression periods.  No hyperactive periods. Tremor seems worse affecting  typing. No PCP now.    Holter monitor occ SVT and rare VT but not requiring treatment  H died 4 years ago  Youngest D relapse breast CA.  Stressful.  New great grand D [place].  Psych med history:  Abilify no response,   Mellaril,  Refuses Seroquel bc F problems with it.  Nardil hyper and goofy, Prozac with side effects, try cyclic antidepressants, Viibryd, Trintellix caused anxiety,  Wellbutrin which caused tremors and goofy which Lexapro toned down, imipramine, Pristiq 100,  Vyvanse, Ritalin, history of Dexedrine during pregnancy which caused weight gain,  Cerefolin NAC,  lithium 600 tremor worse (started lithium 150 mg Nov 2018) Lamotrigine 150 Trileptal 300 helped anxiety Propranolol 50 without help for tremor  Remote hosp for obs on dangerousness irrationally about water in a vase. and panic at [facility] and was on Nardil at the time. F took Seroquel for hallucinations with PD and associated dementia and SE  Review of Systems:  Review of Systems  Constitutional:  Negative for fatigue.  Cardiovascular:  Negative for palpitations.  Gastrointestinal:  Negative for abdominal pain and nausea.  Neurological:  Positive for tremors. Negative for weakness.       Tremor worse at lithium 600 mg daily versus lithium 450 mg daily  Psychiatric/Behavioral:  Positive for sleep disturbance. Negative for agitation, behavioral problems, confusion, decreased concentration, dysphoric mood, hallucinations, self-injury and suicidal ideas. The patient is nervous/anxious. The patient is not hyperactive.     Medications: I have reviewed the patient's current medications.  Current Outpatient Medications  Medication Sig Dispense Refill   Cetirizine HCl (ZYRTEC ALLERGY) 10 MG CAPS 1 capsule     levothyroxine (SYNTHROID) 50 MCG tablet Take 50 mcg by mouth daily.     lithium carbonate 300 MG capsule Take 1 capsule (300 mg total) by mouth daily. 90 capsule 1   methylphenidate (RITALIN) 20 MG tablet Take 1 tablet (20 mg total) by mouth 3 (three) times daily with meals. 270 tablet 0   lamoTRIgine (LAMICTAL) 150 MG tablet Take 1 tablet (150 mg total) by mouth daily. 90 tablet 0   Oxcarbazepine (TRILEPTAL) 150 MG tablet  Take 3 tablets (450 mg total) by mouth at bedtime. 270 tablet 0   No current facility-administered medications  for this visit.    Medication Side Effects: tremor is affecting handwriting and computer worse with 600 mg lithium..  Numbness in toes.  Hands and feet always cold.  Allergies:  Allergies  Allergen Reactions   Codeine Other (See Comments)    Abdominal pain  Other reaction(s): stomach upset   Latex Hives    Other reaction(s): clammy, "feeling horrible"   Septra [Sulfamethoxazole-Trimethoprim] Rash   Sulfa Antibiotics Rash and Other (See Comments)    Other reaction(s): rash    Past Medical History:  Diagnosis Date   ADD (attention deficit disorder)    Basal cell carcinoma    Depression    Diverticulitis    Hypothyroid    d/t lithium   Osteopenia 11/2017   T score -1.1 FRAX 24% / 14%   Peripheral neuropathy    Tremor, essential    Vitamin D deficiency 12/2017    Family History  Problem Relation Age of Onset   Hypertension Mother    Thyroid disease Mother    Breast cancer Mother 37   Breast cancer Daughter 81   Thyroid disease Daughter    Other Father        Brain tumor   Parkinson's disease Father    Dementia Father    Pneumonia Father    Cancer Daughter        Melanoma   Heart disease Paternal Grandfather    Leukemia Maternal Grandmother    Stomach cancer Paternal Grandmother     Social History   Socioeconomic History   Marital status: Widowed    Spouse name: Not on file   Number of children: 3   Years of education: Not on file   Highest education level: Bachelor's degree (e.g., BA, AB, BS)  Occupational History    Comment: retired  Tobacco Use   Smoking status: Former    Packs/day: 0.50    Years: 30.00    Total pack years: 15.00    Types: Cigarettes    Quit date: 2012    Years since quitting: 11.4   Smokeless tobacco: Never  Vaping Use   Vaping Use: Never used  Substance and Sexual Activity   Alcohol use: Not Currently    Comment:  2 glasses of wine nightly   Drug use: Never   Sexual activity: Not Currently    Birth control/protection: Post-menopausal    Comment: 1st intercourse 81 yo-Fewer than 5 partners  Other Topics Concern   Not on file  Social History Narrative   Lives alone   Caffeine- 1 c coffee   Social Determinants of Health   Financial Resource Strain: Not on file  Food Insecurity: Not on file  Transportation Needs: Not on file  Physical Activity: Not on file  Stress: Not on file  Social Connections: Not on file  Intimate Partner Violence: Not on file    Past Medical History, Surgical history, Social history, and Family history were reviewed and updated as appropriate.   Please see review of systems for further details on the patient's review from today.   Objective:   Physical Exam:  BP 124/84   Pulse 80   Physical Exam Constitutional:      General: Katrina Gamble is not in acute distress.    Appearance: Katrina Gamble is well-developed and normal weight.  Musculoskeletal:        General: No deformity.  Neurological:     Mental Status: Katrina Gamble is alert and oriented to person, place, and time.     Cranial Nerves: No  dysarthria.     Motor: Tremor present.     Coordination: Coordination normal.  Psychiatric:        Attention and Perception: Perception normal. Katrina Gamble is inattentive. Katrina Gamble does not perceive auditory or visual hallucinations.        Mood and Affect: Mood is anxious. Mood is not depressed. Affect is not labile, blunt, angry, tearful or inappropriate.        Speech: Speech normal.        Behavior: Behavior normal. Behavior is cooperative.        Thought Content: Thought content is not paranoid or delusional. Thought content does not include homicidal or suicidal ideation. Thought content does not include suicidal plan.        Cognition and Memory: Cognition and memory normal.        Judgment: Judgment normal.     Comments: Good insight Not currently manic or depressed but has been recently. Lately  anxiety and obs thoughts are a problem.   objectively the patient does not show significant mouth movements but Katrina Gamble displayed video at prior visit in which typical mouth movements cheek movements associated with tardive dyskinesia was evident.  Her tremor is rhythmic and is not supportive of a tardive dyskinesia type tremor. Relatively mild facial movements at this time.  Lab Review:     Component Value Date/Time   NA 139 08/25/2020 1410   K 4.3 08/25/2020 1410   CL 103 08/25/2020 1410   CO2 22 08/25/2020 1410   GLUCOSE 85 08/25/2020 1410   BUN 11 08/25/2020 1410   CREATININE 0.93 08/25/2020 1410   CALCIUM 9.7 08/25/2020 1410   PROT 6.7 08/25/2020 1410   ALBUMIN 4.3 08/25/2020 1410   AST 22 08/25/2020 1410   ALT 16 08/25/2020 1410   ALKPHOS 69 08/25/2020 1410   BILITOT 0.5 08/25/2020 1410   GFRNONAA 59 (L) 08/25/2020 1410   GFRAA 69 08/25/2020 1410    No results found for: "WBC", "RBC", "HGB", "HCT", "PLT", "MCV", "MCH", "MCHC", "RDW", "LYMPHSABS", "MONOABS", "EOSABS", "BASOSABS"  No results found for: "POCLITH", "LITHIUM"   No results found for: "PHENYTOIN", "PHENOBARB", "VALPROATE", "CBMZ"   Katrina Gamble says lithium level was 0.6 at PCP on 450 mg daily.   07/2020 lithium low therapeutic by her report [facility]  Specifically her lithium level May 17, 2019 on this dosage of lithium was 0.3.  Her urinalysis was normal Katrina Gamble had a normal CBC, comprehensive metabolic panel was normal including creatinine 0.83 calcium 9.9 and TSH was normal at 2.83.  Lithium level in April 2019 was 0.2 at this dosage.  We will not increase the lithium because Katrina Gamble is currently having tremor at the low dose.  .res Assessment: Plan:    Depression, major, recurrent, in partial remission (HCC) - Plan: Oxcarbazepine (TRILEPTAL) 150 MG tablet  Obsessional thoughts of harming others  Generalized anxiety disorder - Plan: Oxcarbazepine (TRILEPTAL) 150 MG tablet  Attention deficit hyperactivity disorder  (ADHD), predominantly inattentive type  Tremor, essential  Tardive dyskinesia  Lithium use  Severe episode of recurrent major depressive disorder, without psychotic features (HCC) - Plan: lamoTRIgine (LAMICTAL) 150 MG tablet   .Greater than 50% of 30-minute face to face time with patient was spent on counseling and coordination of care. We discussed Katrina Gamble has a long history over decades of cycling major depression and treatment resistance to meds noted above of nearly every antidepressant category.   Lately more anxious and obsessive with mixed type sx.  Pt has had what Katrina Gamble describes  as mild hypomanic episodes in the past in response to antidepressants primarily.  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  Not been depressed lately with increase lamotrigine and less anxiety with Trileptal. Mood cycling worse with lithium reduction from 300 to 150 mg While Katrina Gamble does not identify herself is bipolar because her upswings seem only normal to her.  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  Obsessions Katrina Gamble made a mistake which will cause harm to others. still having obsessive intrusive thoughts without reason.  Katrina Gamble thinks it's related  to meds but reviewed med sequencing of changes do not provide an obvious med pcpt.  Lowest risk mood stabilizing med should be Trileptal.  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   Benefit Trileptal for anxiety and somewhat for sleep seems to be lost but not mood cycling. increase Trileptal to 450  mg HS for anxiety and insomnia.   Her best response so far has been from low-dose lithium with a stimulant but lamotrigine has seemed to help for 6 mos in 2020  until relapse in Summer.  The depression resolved again with the increase in lamotrigine from 100 to 150 mg daily.  They do not follow any pattern and do not appear to have any precipitant.   Cognitive complaints but not substantially impairing at this time.  Katrina Gamble doesn't want cognitive enhancing meds.  Cerefolin NAC for a month with unclear effects. Rec trial Disc the off-label use of N-Acetylcysteine at 600 mg daily to help with mild cognitive problems.  It can be combined with a B-complex vitamin as the B-12 and folate have been shown to sometimes enhance the effect.  Gave her samples  We tried an increase lithium to 600 mg daily and check a blood level.   The increase in lithium did not help her mood and her made her tremor worse. Counseled patient regarding potential benefits, risks, and side effects of lithium to include potential risk of lithium affecting thyroid and renal function.  Discussed need for periodic lab monitoring to determine drug level and to assess for potential adverse effects.  Counseled patient regarding signs and symptoms of lithium toxicity and advised that they notify office immediately or seek urgent medical attention if experiencing these signs and symptoms.  Patient advised to contact office with any questions or concerns.    The tremor predates lithium and predates stimulant use but appears to have been made worse by the increase in lamotrigine.  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  Katrina Gamble saw a neurologist. Does not really want to add meds for tremor.  objectively the patient does not show significant mouth movements but Katrina Gamble displayed video since her last visit in which typical mouth movements cheek movements associated with tardive dyskinesia was evident.  Her tremor is rhythmic and is not supportive of a tardive dyskinesia type tremor. History of essential tremor prior to lithium. Alternative primidone. Neurology concerned about balance with this  one.  The increase lamotrigine to 150 mg daily resolved the depression.  it was helpful for several months at 100. Consider reducing lamotrigine to see if obs thoughts get better  Consider primidone for tremor.  On lithium 300  mg nightly.  It appears Katrina Gamble cannot tolerate a higher dose.  Consider reduction to reduce tremor possibly given improvement from lamotrigine. Katrina Gamble wanted to try lower dose lithium 150 mg daily and this did not help tremor and mood cycling is worse, so increase lithium bak to300 mg daily.  Discussed potential benefits, risks, and side effects of stimulants with patient to include increased heart rate, palpitations, insomnia, increased anxiety, increased irritability, or decreased appetite.  Instructed patient to contact office if experiencing any significant tolerability issues. Pleased Katrina Gamble got the [place] generic Ritalin bc easier to cut. Continue Ritalin as prescribed 3/4 tablet QID.  Prefers [place] generic. Katrina Gamble got different brand [employer] of methylphenidate and it shatters when tries to quarter it.  Continue  Vitamin D. Disc reasons for doing so for mental and physical health in detail.  Katrina Gamble has stopped bc Katrina Gamble hates taking pills.  Supportive therapy dealing with GS's mental health problems.  FU 8 weeks or sooner if possible.  Meredith Staggers, MD, DFAPA '   Please see After Visit Summary for patient specific instructions.  No future appointments.   No orders of the defined types were placed in this encounter.      -------------------------------

## 2022-03-30 ENCOUNTER — Telehealth: Payer: Self-pay | Admitting: Psychiatry

## 2022-03-30 ENCOUNTER — Other Ambulatory Visit: Payer: Self-pay

## 2022-03-30 DIAGNOSIS — F900 Attention-deficit hyperactivity disorder, predominantly inattentive type: Secondary | ICD-10-CM

## 2022-03-30 DIAGNOSIS — F3341 Major depressive disorder, recurrent, in partial remission: Secondary | ICD-10-CM

## 2022-03-30 MED ORDER — METHYLPHENIDATE HCL 20 MG PO TABS: 20.0000 mg | ORAL_TABLET | Freq: Three times a day (TID) | ORAL | 0 refills | Status: DC

## 2022-03-30 NOTE — Telephone Encounter (Signed)
Katrina Gamble requesting a return call to discuss her medication.  Contact # [phone]

## 2022-03-30 NOTE — Telephone Encounter (Signed)
Pended.

## 2022-04-25 DIAGNOSIS — M26601 Right temporomandibular joint disorder, unspecified: Secondary | ICD-10-CM | POA: Diagnosis not present

## 2022-04-25 DIAGNOSIS — Z6822 Body mass index (BMI) 22.0-22.9, adult: Secondary | ICD-10-CM | POA: Diagnosis not present

## 2022-05-03 DIAGNOSIS — M792 Neuralgia and neuritis, unspecified: Secondary | ICD-10-CM | POA: Diagnosis not present

## 2022-05-03 DIAGNOSIS — L821 Other seborrheic keratosis: Secondary | ICD-10-CM | POA: Diagnosis not present

## 2022-05-03 DIAGNOSIS — Z9189 Other specified personal risk factors, not elsewhere classified: Secondary | ICD-10-CM | POA: Diagnosis not present

## 2022-05-24 ENCOUNTER — Encounter: Payer: Self-pay | Admitting: Psychiatry

## 2022-05-24 ENCOUNTER — Ambulatory Visit (INDEPENDENT_AMBULATORY_CARE_PROVIDER_SITE_OTHER): Payer: Medicare Other | Admitting: Psychiatry

## 2022-05-24 VITALS — BP 142/68 | HR 75

## 2022-05-24 DIAGNOSIS — G250 Essential tremor: Secondary | ICD-10-CM | POA: Diagnosis not present

## 2022-05-24 DIAGNOSIS — F900 Attention-deficit hyperactivity disorder, predominantly inattentive type: Secondary | ICD-10-CM | POA: Diagnosis not present

## 2022-05-24 DIAGNOSIS — F411 Generalized anxiety disorder: Secondary | ICD-10-CM

## 2022-05-24 DIAGNOSIS — F3341 Major depressive disorder, recurrent, in partial remission: Secondary | ICD-10-CM

## 2022-05-24 DIAGNOSIS — F331 Major depressive disorder, recurrent, moderate: Secondary | ICD-10-CM

## 2022-05-24 DIAGNOSIS — G2401 Drug induced subacute dyskinesia: Secondary | ICD-10-CM

## 2022-05-24 DIAGNOSIS — Z79899 Other long term (current) drug therapy: Secondary | ICD-10-CM

## 2022-05-24 DIAGNOSIS — F332 Major depressive disorder, recurrent severe without psychotic features: Secondary | ICD-10-CM

## 2022-05-24 DIAGNOSIS — F428 Other obsessive-compulsive disorder: Secondary | ICD-10-CM | POA: Diagnosis not present

## 2022-05-24 MED ORDER — LITHIUM CARBONATE 300 MG PO CAPS: 300.0000 mg | ORAL_CAPSULE | Freq: Every day | ORAL | 1 refills | Status: DC

## 2022-05-24 MED ORDER — OXCARBAZEPINE 150 MG PO TABS: 450.0000 mg | ORAL_TABLET | Freq: Every day | ORAL | 0 refills | Status: DC

## 2022-05-24 MED ORDER — METHYLPHENIDATE HCL 20 MG PO TABS: 20.0000 mg | ORAL_TABLET | Freq: Three times a day (TID) | ORAL | 0 refills | Status: DC

## 2022-05-24 MED ORDER — LAMOTRIGINE 150 MG PO TABS: 150.0000 mg | ORAL_TABLET | Freq: Every day | ORAL | 0 refills | Status: DC

## 2022-05-24 NOTE — Progress Notes (Signed)
Katrina Gamble [id number] 31-Jan-1941 81 y.o.     Subjective:   Patient ID:  KASIDY GIANINO is a 81 y.o. (DOB 10-Dec-1940) female.  Chief Complaint:  Chief Complaint  Patient presents with   Follow-up   Depression   ADD   Medication Reaction    Depression        Associated symptoms include no decreased concentration, no fatigue and no suicidal ideas.  Past medical history includes anxiety.   Anxiety Symptoms include nervous/anxious behavior. Patient reports no confusion, decreased concentration, dizziness, nausea, palpitations or suicidal ideas.    Medication Refill Pertinent negatives include no abdominal pain, fatigue, nausea or weakness.   Kayal A Blevens presents today for follow-up of ADD and mood.  When t seen May 15, 2019.  She continued to have cycles of depression without clear precipitant.  It was suggested that she increase lithium to 450 mg daily check a lithium level.  seen July 15, 2019.  She was having more depression.  The following changes were made. Rec increase lithium to 600 mg daily for a week and check blood level. If this fails to help or is intolerable then recommend lamotrigine trial as noted. She continued Ritalin 20 mg 3 times daily per usual  No lithium level nor BMP were received.  seen August 20 2019.  She was still dealing with depression and had not seen any improvement in depression at 600 mg versus 450 mg daily.  She did not get a blood level. Reduce lithium to 450 mg daily DT NR at 600 daily  recommend lamotrigine trial as noted.  She called back December 30 stating she had been on lamotrigine for 2 weeks and was noticing a tremor and wondered if it was related.  BC of tremor she went down again from 450 mg in lithium and had reduced it to 300 mg instead of 450 mg.  seen October 15, 2019.  The following was noted: CO  Worsening tremor intentional affecting mouse use about December 30.  Wonders about essential  tremor vs TD.   Thyroid checked a couple months ago and dose was not changed.  Tremor in head interfered with dental procedures some.  Alcohol does not help tremor and  Never has.   Essential tremor dx decades ago. Mood is better with lamotrigine 100 mg daily.  Mood more level.   Plan: No meds were changed.  She continued the following: Continue lamotrigine 100 mg daily as it was helpful. Continue Ritalin as prescribed. Continue lithium 300 mg nightly.  So far her mood is been stable with a lower dose Not using much propranolol.  01/12/2020 appointment, the following is noted: Questions about the propranolol.  Still has sig tremor that varies in intensity from mild to severe.  Not always bothered by it unless doing things with fine motor control like writing.  Has taken up to 20 mg propranolol and not more bc ? GI SE.   Mood still good.  Dep controlled.  Anxiety hard to tell bc D with recurrent breast CA. Plan no meds changed.  04/13/2020 appointment with the following noted: Propranolol with limited effect on tremor even up to 50 mg daily.  Hard time writing. Worse with guilty, anxious depressed thoughts.  Neg self talk.  Not sure if anxiety triggered neg thinking.  No change stress or meds.  Sleep is disjointed.  Plan: Increase lamotrigine to 150 mg daily as it was helpful for several months at 100. Continue Ritalin as prescribed.  Continue lithium 300 mg nightly.   05/25/2020 appointment with the following noted: After increased lamotrigine to mood overall has been very good with mood.  Bad part is tremor has gotten much worse with intention.  Wonders about neurologist. No med changes  08/24/2020 appointment with the following noted:  Disc lithium and kidney function and questions about CKD 3. Missed neuro appt bc got sick right before it.  RS for FEB.  But opening for tomorrow.  Tremor is increasing . Insomnia is worse.  Trouble going to sleep for a couple of hours.  3 nights per  week. Racing thoughts all over the place.  Some are regretful thoughts from the past but random things.  Don't have the thoughts in the day. No naps. Cut caffeine max 2 cups coffee as late as 11 AM. Ritalin as late as 630 pm and usually that won't bother sleep.  Kids think her ADD meds aren't working well.  Scattered and hard to stay on task.   Not hyper spending or otherwise hyper.  Always tendency to talk if someone to listen. Can internally feel tense.  Not irritable. Doesn't want more meds. Pretty good with depression. Plan: The increase lamotrigine to 150 mg daily resolved the depression.  it was helpful for several months at 100. Continue Ritalin as prescribed 3/4 tablet QID.  Prefers Mal[employer]roft generic. She got different brand Accord of methylphenidate and it shatters when tries to quarter it. Continue lithium 300 mg nightly.  Agree with her plans to see neurology.  11/22/2020 appointment with following noted: Neurology diagnosed essential tremor with possible exacerbation by lithium and anxiety and suggested at the moment to withhold treatment for fear of worsening her balance and because the tremor was rather mild on exam.  MRI showed age-appropriate moderate chronic white matter disease. Pt doesn't think her tremor is mild as did neuro.  Worse with writing.  Disc neuro consult.  Disc propranolol dosing and risk of depression at higher dose.  Tremor noticeable in AM before coffee and affects writing and typing. Depression is under control.  Anxiety a little worse and experienced physically.  If get anxious then it builds on itself. Denies appetite disturbance.  Patient reports that energy and motivation have been good.  Patient denies any difficulty with concentration. Patient denies any suicidal ideation. Plan: Retry propranolol per her request 40 mg twice daily for a few days and if no benefit then increase to 50 mg twice daily for a few days. Can increase every few days by 10 mg to a  max of 80 mg twice daily as long as there are no side effects and you monitor blood pressure and pulse without significant changes or problems. The increase lamotrigine to 150 mg daily resolved the depression.  it was helpful for several months at 100. Continue Ritalin as prescribed 3/4 tablet QID.  Prefers Malincroft generic. She got different brand Accord of methylphenidate and it shatters when tries to quarter it. Continue lithium 300 mg nightly.   01/25/2021 appointment with the following noted: Difficult time adjusting to and fearful of changing meds.  Had number of stressors.   Since here # stressors.  GS rehab alcohol and bipolar disorder.   Recognizes pattern of avoidance in her life.  Plans to still try the propranolol bc the tremor is better. Patient reports stable mood and denies depressed or irritable moods.   Patient denies difficulty with sleep initiation or maintenance. Denies appetite disturbance.  Patient reports that energy and motivation have  been good.  Patient denies any difficulty with concentration.  Patient denies any suicidal ideation.  Consistent with meds. Pllan: Retry propranolol when she feel ready per her request 40 mg twice daily for a few days and if no benefit then increase to 50 mg twice daily for a few days. Can increase every few days by 10 mg to a max of 80 mg twice daily as long as there are no side effects and you monitor blood pressure and pulse without significant changes or problems. It can help both tremor and anxiety. The increase lamotrigine to 150 mg daily resolved the depression.  it was helpful for several months at 100. Continue Ritalin as prescribed 3/4 tablet QID.  Prefers Malincroft generic. She got different brand Accord of methylphenidate and it shatters when tries to quarter it. Continue lithium 300 mg nightly.   05/11/2021 appointment with the following noted: Continues lamotrigine 150 mg daily, lithium 300 mg daily, Ritalin 20 mg 3 times  daily.  Has not been taking propranolol which was prescribed last visit for tremor and anxiety.  She never tried it.  She didn't feel it was bad enough to warrant treatment.  Tremor affects use of mouse.  Affects writing.   Has recurrence of obsessive thinking and trouble sleeping for a couple of mos.  Obs thoughts on things she's done or not done and that she's responsible for something bad that might happen..  Obs interfere with sleep and are worse.  Last night after 330 before asleep and awake 730.  Never nap and not really tired in the daytime.   Periods of several days in a row of Energizer bunny and then resolves. Plan: Start Trileptal 150 mg HS for 5 days then 300 mg HS.  If not better in a couple of weeks call.  07/10/2021 appointment with the following noted: Disc BP this am.  Not checking it at home.  Is having some back and leg pain today which might increase BP. Really helped anxiety with Trileptal 300 mg nightly.  Choose to go to sleep late.   Avg 4-7 hours sleep. Patient reports stable mood and denies depressed or irritable moods.  Patient denies any recent difficulty with anxiety.  Patient denies difficulty with sleep initiation or maintenance. Denies appetite disturbance.  Patient reports that energy and motivation have been good.  Patient denies any difficulty with concentration.  Patient denies any suicidal ideation. SE always kind of clutsy but may be a little more.  No balance issues in the day. Plan: The increase lamotrigine to 150 mg daily resolved the depression.  it was helpful for several months at 100. Continue Trileptal 300 mg nightly Continue lithium 300 mg nightly Continue Ritalin 20 mg tablets 3/4 to 1 tablet 3 times daily  10/09/2021 appointment with the following noted: 3 issues.  Main concerns are cognitive and tremor. 1 is cognitive seems atypical like with numbers like figuring years.  Computation.   More trouble folding fitted sheets.  Thinks it is worse over 3-6  mos.   No sig trouble paying bills or doing checking account.  Pays online Has to reread recipes over longer period of time. Not as motivated for self care and getting out of the house.  Not markedly depressed otherwise. Anxiety is still better with Trileptal, but will tend to worry over somatic concerns and think the worst. Sleep is pretty good and sleeping later than usual. Trmor is worse.  Neuro was somewhat dismissive of tremor.  Remote dx of  essential tremor Lithium is successful for mood. Plan; no med changes except reduced lithium to 150 mg daily to see if tremor would be better.  Continue lamotrigine 150, Ritalin 20 mg tablets 3/4 tablets 3 times daily, oxcarbazepine 300 mg nightly,   12/07/21 appt noted: Tremor no better with less lithium. More mood cycling from depression to highs with energizer bunny to anxiety with sense of doom.   Periods of flashing or intrusive thoughts she had done something wrong to new great grand child. Still prefers Mal[employer]roft generic Ritalin. Plan: She wanted to try lower dose lithium 150 mg daily and this did not help tremor and mood cycling is worse, so increase lithium bak to300 mg daily.  03/05/2022 appointment with the following noted: Continues lamotrigine 150 mg daily, lithium 300 mg daily, oxcarbazepine 300 mg nightly, Ritalin 20 mg 3 times daily Last still having obsessive intrusive thoughts without reason.  She thinks it's related  to meds but reviewed med sequencing of changes do not provide an obvious med pcpt.   Obsessions primarily about doing something wrong that may harm others.  Can cause sleeplessness and panic feelings. Having insomnia initially with anxious thoughts.    No depression periods.  No hyperactive periods. Tremor seems worse affecting  typing. No PCP now.   Plan: increase oxcarb 450 HS  05/24/2022 appointment noted: Continues lamotrigine 150 mg daily, lithium 300 mg nightly, Trileptal 450 mg nightly, Ritalin 20 mg 3 times  daily. Better with increase oxcarbazepine 450 mg HS with anxiety and insomnia. Last couple of weeks exceptionally organized and motivated.   Sleep is good but tendency to stay up late.  No racing thoughts at night.  No reckless impulsive behavior. No depression. Anxiety is better with oxcarb No sig SE. To [place] to visit lifelong friend next week.  Holter monitor occ SVT and rare VT but not requiring treatment  H died 4 years ago  Youngest D relapse breast CA.  Stressful.  New great grand D [place].  Psych med history:  Abilify no response,  Mellaril,  Refuses Seroquel bc F problems with it.  Nardil hyper and goofy, Prozac with side effects, try cyclic antidepressants, Viibryd, Trintellix caused anxiety,  Wellbutrin which caused tremors and goofy which Lexapro toned down, imipramine, Pristiq 100,  Vyvanse, Ritalin, history of Dexedrine during pregnancy which caused weight gain,  Cerefolin NAC,   lithium 600 tremor worse (started lithium 150 mg Nov 2018) We tried an increase lithium to 600 mg daily and check a blood level.   The increase in lithium did not help her mood and her made her tremor worse.  Lamotrigine 150 Trileptal 300 helped anxiety Propranolol 50 without help for tremor  Remote hosp for obs on dangerousness irrationally about water in a vase. and panic at [facility] and was on Nardil at the time. F took Seroquel for hallucinations with PD and associated dementia and SE  Review of Systems:  Review of Systems  Constitutional:  Negative for fatigue.  Cardiovascular:  Negative for palpitations.  Gastrointestinal:  Negative for abdominal pain and nausea.  Neurological:  Positive for tremors. Negative for dizziness and weakness.       Tremor worse at lithium 600 mg daily versus lithium 450 mg daily  Psychiatric/Behavioral:  Positive for sleep disturbance. Negative for agitation, behavioral problems, confusion, decreased concentration, dysphoric mood, hallucinations,  self-injury and suicidal ideas. The patient is nervous/anxious. The patient is not hyperactive.     Medications: I have reviewed the patient's current medications.  Current Outpatient Medications  Medication Sig Dispense Refill   Cetirizine HCl (ZYRTEC ALLERGY) 10 MG CAPS 1 capsule     levothyroxine (SYNTHROID) 50 MCG tablet Take 50 mcg by mouth daily.     lamoTRIgine (LAMICTAL) 150 MG tablet Take 1 tablet (150 mg total) by mouth daily. 90 tablet 0   lithium carbonate 300 MG capsule Take 1 capsule (300 mg total) by mouth daily. 90 capsule 1   methylphenidate (RITALIN) 20 MG tablet Take 1 tablet (20 mg total) by mouth 3 (three) times daily with meals. 270 tablet 0   OXcarbazepine (TRILEPTAL) 150 MG tablet Take 3 tablets (450 mg total) by mouth at bedtime. 270 tablet 0   No current facility-administered medications for this visit.    Medication Side Effects: tremor is affecting handwriting and computer worse with 600 mg lithium..  Numbness in toes.  Hands and feet always cold.  Allergies:  Allergies  Allergen Reactions   Codeine Other (See Comments)    Abdominal pain  Other reaction(s): stomach upset   Latex Hives    Other reaction(s): clammy, "feeling horrible"   Septra [Sulfamethoxazole-Trimethoprim] Rash   Sulfa Antibiotics Rash and Other (See Comments)    Other reaction(s): rash    Past Medical History:  Diagnosis Date   ADD (attention deficit disorder)    Basal cell carcinoma    Depression    Diverticulitis    Hypothyroid    d/t lithium   Osteopenia 11/2017   T score -1.1 FRAX 24% / 14%   Peripheral neuropathy    Tremor, essential    Vitamin D deficiency 12/2017    Family History  Problem Relation Age of Onset   Hypertension Mother    Thyroid disease Mother    Breast cancer Mother 14   Breast cancer Daughter 42   Thyroid disease Daughter    Other Father        Brain tumor   Parkinson's disease Father    Dementia Father    Pneumonia Father    Cancer  Daughter        Melanoma   Heart disease Paternal Grandfather    Leukemia Maternal Grandmother    Stomach cancer Paternal Grandmother     Social History   Socioeconomic History   Marital status: Widowed    Spouse name: Not on file   Number of children: 3   Years of education: Not on file   Highest education level: Bachelor's degree (e.g., BA, AB, BS)  Occupational History    Comment: retired  Tobacco Use   Smoking status: Former    Packs/day: 0.50    Years: 30.00    Total pack years: 15.00    Types: Cigarettes    Quit date: 2012    Years since quitting: 11.7   Smokeless tobacco: Never  Vaping Use   Vaping Use: Never used  Substance and Sexual Activity   Alcohol use: Not Currently    Comment: 2 glasses of wine nightly   Drug use: Never   Sexual activity: Not Currently    Birth control/protection: Post-menopausal    Comment: 1st intercourse 81 yo-Fewer than 5 partners  Other Topics Concern   Not on file  Social History Narrative   Lives alone   Caffeine- 1 c coffee   Social Determinants of Health   Financial Resource Strain: Not on file  Food Insecurity: Not on file  Transportation Needs: Not on file  Physical Activity: Not on file  Stress: Not on file  Social  Connections: Not on file  Intimate Partner Violence: Not on file    Past Medical History, Surgical history, Social history, and Family history were reviewed and updated as appropriate.   Please see review of systems for further details on the patient's review from today.   Objective:   Physical Exam:  BP (!) 142/68   Pulse 75   Physical Exam Constitutional:      General: She is not in acute distress.    Appearance: She is well-developed and normal weight.  Musculoskeletal:        General: No deformity.  Neurological:     Mental Status: She is alert and oriented to person, place, and time.     Cranial Nerves: No dysarthria.     Motor: Tremor present.     Coordination: Coordination normal.   Psychiatric:        Attention and Perception: Perception normal. She is inattentive. She does not perceive auditory or visual hallucinations.        Mood and Affect: Mood is not anxious or depressed. Affect is not labile, blunt, angry, tearful or inappropriate.        Speech: Speech normal.        Behavior: Behavior normal. Behavior is cooperative.        Thought Content: Thought content is not paranoid or delusional. Thought content does not include homicidal or suicidal ideation. Thought content does not include suicidal plan.        Cognition and Memory: Cognition and memory normal.        Judgment: Judgment normal.     Comments: Good insight Not currently manic or depressed  Lately anxiety and obs thoughts are better   objectively the patient does not show significant mouth movements but she displayed video at prior visit in which typical mouth movements cheek movements associated with tardive dyskinesia was evident.  Her tremor is rhythmic and is not supportive of a tardive dyskinesia type tremor. Relatively mild facial movements at this time.  Lab Review:     Component Value Date/Time   NA 139 08/25/2020 1410   K 4.3 08/25/2020 1410   CL 103 08/25/2020 1410   CO2 22 08/25/2020 1410   GLUCOSE 85 08/25/2020 1410   BUN 11 08/25/2020 1410   CREATININE 0.93 08/25/2020 1410   CALCIUM 9.7 08/25/2020 1410   PROT 6.7 08/25/2020 1410   ALBUMIN 4.3 08/25/2020 1410   AST 22 08/25/2020 1410   ALT 16 08/25/2020 1410   ALKPHOS 69 08/25/2020 1410   BILITOT 0.5 08/25/2020 1410   GFRNONAA 59 (L) 08/25/2020 1410   GFRAA 69 08/25/2020 1410    No results found for: "WBC", "RBC", "HGB", "HCT", "PLT", "MCV", "MCH", "MCHC", "RDW", "LYMPHSABS", "MONOABS", "EOSABS", "BASOSABS"  No results found for: "POCLITH", "LITHIUM"   No results found for: "PHENYTOIN", "PHENOBARB", "VALPROATE", "CBMZ"   She says lithium level was 0.6 at PCP on 450 mg daily.   07/2020 lithium low therapeutic by her  report [facility]  Specifically her lithium level May 17, 2019 on this dosage of lithium was 0.3.  Her urinalysis was normal she had a normal CBC, comprehensive metabolic panel was normal including creatinine 0.83 calcium 9.9 and TSH was normal at 2.83.  Lithium level in April 2019 was 0.2 at this dosage.  We will not increase the lithium because she is currently having tremor at the low dose.  .res Assessment: Plan:    Depression, major, recurrent, in partial remission ([place]) - Plan: methylphenidate (RITALIN) 20  MG tablet, OXcarbazepine (TRILEPTAL) 150 MG tablet  Generalized anxiety disorder - Plan: OXcarbazepine (TRILEPTAL) 150 MG tablet  Attention deficit hyperactivity disorder (ADHD), predominantly inattentive type - Plan: methylphenidate (RITALIN) 20 MG tablet  Obsessional thoughts of harming others  Tremor, essential  Tardive dyskinesia  Lithium use  Severe episode of recurrent major depressive disorder, without psychotic features ([place]) - Plan: lamoTRIgine (LAMICTAL) 150 MG tablet  Major depressive disorder, recurrent episode, moderate ([place]) - Plan: lithium carbonate 300 MG capsule   .Greater than 50% of 30-minute face to face time with patient was spent on counseling and coordination of care. We discussed Novalynn has a long history over decades of cycling major depression and treatment resistance to meds noted above of nearly every antidepressant category.  Was more anxious and obsessive with mixed type sx until increase oxcarbazepine 450 mg HS.  Pt has had what she describes as mild hypomanic episodes in the past in response to antidepressants primarily.  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  Not been depressed lately with increase lamotrigine and less anxiety with Trileptal. Mood cycling worse with lithium reduction from 300 to 150 mg and better with 300 mg. While she does not identify herself is bipolar  because her upswings seem only normal to her.  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  Obsessions she made a mistake which will cause harm to others are better.  Lowest risk mood stabilizing med should be Trileptal.  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   Benefit Trileptal for anxiety and somewhat for sleep even better with increase. Continue Trileptal to 450  mg HS for anxiety and insomnia.   Her best response so far has been from low-dose lithium with a stimulant but lamotrigine has seemed to help for 6 mos in 2020 until relapse in Summer.  The depression resolved again with the increase in lamotrigine from 100 to 150 mg daily.  They do not follow any pattern and do not appear to have any precipitant.   Cognitive complaints but not substantially impairing at this time.  She doesn't want cognitive enhancing meds.  Cerefolin NAC for a month with unclear effects. Rec trial Disc the off-label use of N-Acetylcysteine at 600 mg daily to help with mild cognitive problems.  It can be combined with a B-complex vitamin as the B-12 and folate have been shown to sometimes enhance the effect.  Gave her samples  We tried an increase lithium to 600 mg daily and check a blood level.   The increase in lithium did not help her mood and her made her tremor worse. Counseled patient regarding potential benefits, risks, and side effects of lithium to include potential risk of lithium affecting thyroid and renal function.  Discussed need for periodic lab monitoring to determine drug level and to assess for potential adverse effects.  Counseled patient regarding signs and symptoms of lithium toxicity and advised that they notify office immediately or seek urgent medical  attention if experiencing these signs and symptoms.  Patient advised to contact office with any questions or concerns.    The tremor predates lithium and predates stimulant use but appears to have been made worse by the  increase in lamotrigine.  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  She saw a neurologist. Does not really want to add meds for tremor.  objectively the patient does not show significant mouth movements but she displayed video since her last visit in which typical mouth movements cheek movements associated with tardive dyskinesia was evident.  Her tremor is rhythmic and is not supportive of a tardive dyskinesia type tremor. History of essential tremor prior to lithium. Alternative primidone. Neurology concerned about balance with this one.  The increase lamotrigine to 150 mg daily resolved the depression.  it was helpful for several months at 100. Consider reducing lamotrigine to see if obs thoughts get better  Consider primidone for tremor.  On lithium 300 mg nightly.  It appears she cannot tolerate a higher dose.  Consider reduction to reduce tremor possibly given improvement from lamotrigine. She wanted to try lower dose lithium 150 mg daily and this did not help tremor and mood cycling is worse, so increase lithium back to 300 mg daily.  Discussed potential benefits, risks, and side effects of stimulants with patient to include increased heart rate, palpitations, insomnia, increased anxiety, increased irritability, or decreased appetite.  Instructed patient to contact office if experiencing any significant tolerability issues. Pleased she got the [place] generic Ritalin bc easier to cut. Continue Ritalin as prescribed 3/4 tablet QID.  Prefers [place] generic. She got different brand [place] of methylphenidate and it shatters when tries to quarter it.  Continue  Vitamin D. Disc reasons for doing so for mental and physical health in detail.  She has stopped bc  she hates taking pills.  Supportive therapy dealing with GS's mental health problems.  No med changes: lamotrigine 150 mg daily, lithium 300 mg nightly, Trileptal 450 mg nightly, Ritalin 20 mg 3 times daily.  FU  3 mos  Lynder Parents, MD, DFAPA '   Please see After Visit Summary for patient specific instructions.  Future Appointments  Date Time Provider [place]  06/20/2022  9:00 AM Cottle, Billey Co., MD [facility] None     No orders of the defined types were placed in this encounter.      -------------------------------

## 2022-05-28 DIAGNOSIS — Z23 Encounter for immunization: Secondary | ICD-10-CM | POA: Diagnosis not present

## 2022-06-20 ENCOUNTER — Ambulatory Visit: Payer: Medicare Other | Admitting: Psychiatry

## 2022-07-02 ENCOUNTER — Other Ambulatory Visit: Payer: Self-pay | Admitting: Obstetrics & Gynecology

## 2022-07-02 DIAGNOSIS — Z1231 Encounter for screening mammogram for malignant neoplasm of breast: Secondary | ICD-10-CM

## 2022-07-13 DIAGNOSIS — Z23 Encounter for immunization: Secondary | ICD-10-CM | POA: Diagnosis not present

## 2022-07-13 DIAGNOSIS — R2689 Other abnormalities of gait and mobility: Secondary | ICD-10-CM | POA: Diagnosis not present

## 2022-07-13 DIAGNOSIS — R251 Tremor, unspecified: Secondary | ICD-10-CM | POA: Diagnosis not present

## 2022-07-23 ENCOUNTER — Other Ambulatory Visit: Payer: Self-pay | Admitting: Psychiatry

## 2022-07-23 DIAGNOSIS — F331 Major depressive disorder, recurrent, moderate: Secondary | ICD-10-CM

## 2022-08-09 DIAGNOSIS — M25561 Pain in right knee: Secondary | ICD-10-CM | POA: Diagnosis not present

## 2022-08-09 DIAGNOSIS — M545 Low back pain, unspecified: Secondary | ICD-10-CM | POA: Diagnosis not present

## 2022-08-09 DIAGNOSIS — M25551 Pain in right hip: Secondary | ICD-10-CM | POA: Diagnosis not present

## 2022-08-14 DIAGNOSIS — Z6822 Body mass index (BMI) 22.0-22.9, adult: Secondary | ICD-10-CM | POA: Diagnosis not present

## 2022-08-14 DIAGNOSIS — M25521 Pain in right elbow: Secondary | ICD-10-CM | POA: Diagnosis not present

## 2022-08-14 DIAGNOSIS — E559 Vitamin D deficiency, unspecified: Secondary | ICD-10-CM | POA: Diagnosis not present

## 2022-08-14 DIAGNOSIS — Z122 Encounter for screening for malignant neoplasm of respiratory organs: Secondary | ICD-10-CM | POA: Diagnosis not present

## 2022-08-14 DIAGNOSIS — Z0000 Encounter for general adult medical examination without abnormal findings: Secondary | ICD-10-CM | POA: Diagnosis not present

## 2022-08-14 DIAGNOSIS — M25522 Pain in left elbow: Secondary | ICD-10-CM | POA: Diagnosis not present

## 2022-08-14 DIAGNOSIS — F339 Major depressive disorder, recurrent, unspecified: Secondary | ICD-10-CM | POA: Diagnosis not present

## 2022-08-14 DIAGNOSIS — E039 Hypothyroidism, unspecified: Secondary | ICD-10-CM | POA: Diagnosis not present

## 2022-08-14 DIAGNOSIS — Z79899 Other long term (current) drug therapy: Secondary | ICD-10-CM | POA: Diagnosis not present

## 2022-08-14 DIAGNOSIS — M8580 Other specified disorders of bone density and structure, unspecified site: Secondary | ICD-10-CM | POA: Diagnosis not present

## 2022-08-14 DIAGNOSIS — Z23 Encounter for immunization: Secondary | ICD-10-CM | POA: Diagnosis not present

## 2022-08-22 ENCOUNTER — Ambulatory Visit
Admission: RE | Admit: 2022-08-22 | Discharge: 2022-08-22 | Disposition: A | Discharge status: Home / Self Care | Payer: Medicare Other | Source: Ambulatory Visit | Attending: Obstetrics & Gynecology | Admitting: Obstetrics & Gynecology

## 2022-08-22 DIAGNOSIS — Z1231 Encounter for screening mammogram for malignant neoplasm of breast: Secondary | ICD-10-CM

## 2022-08-23 ENCOUNTER — Ambulatory Visit (INDEPENDENT_AMBULATORY_CARE_PROVIDER_SITE_OTHER): Payer: Medicare Other | Admitting: Psychiatry

## 2022-08-23 ENCOUNTER — Encounter: Payer: Self-pay | Admitting: Psychiatry

## 2022-08-23 VITALS — BP 121/58 | HR 88

## 2022-08-23 DIAGNOSIS — Z79899 Other long term (current) drug therapy: Secondary | ICD-10-CM

## 2022-08-23 DIAGNOSIS — F428 Other obsessive-compulsive disorder: Secondary | ICD-10-CM

## 2022-08-23 DIAGNOSIS — F900 Attention-deficit hyperactivity disorder, predominantly inattentive type: Secondary | ICD-10-CM

## 2022-08-23 DIAGNOSIS — F3341 Major depressive disorder, recurrent, in partial remission: Secondary | ICD-10-CM

## 2022-08-23 DIAGNOSIS — F411 Generalized anxiety disorder: Secondary | ICD-10-CM

## 2022-08-23 DIAGNOSIS — G2401 Drug induced subacute dyskinesia: Secondary | ICD-10-CM | POA: Diagnosis not present

## 2022-08-23 DIAGNOSIS — G250 Essential tremor: Secondary | ICD-10-CM

## 2022-08-23 NOTE — Progress Notes (Signed)
Katrina Gamble [id number] 1940-10-28 81 y.o.     Subjective:   Patient ID:  Katrina Gamble is a 81 y.o. (DOB 01-Mar-1941) female.  Chief Complaint:  Chief Complaint  Patient presents with   Follow-up   Depression   Anxiety   ADHD    Depression        Associated symptoms include no decreased concentration, no fatigue and no suicidal ideas.  Past medical history includes anxiety.   Anxiety Symptoms include nervous/anxious behavior. Patient reports no confusion, decreased concentration, dizziness, nausea, palpitations or suicidal ideas.    Medication Refill Pertinent negatives include no abdominal pain, fatigue or nausea.   Katrina Gamble presents today for follow-up of ADD and mood.  When t seen May 15, 2019.  She continued to have cycles of depression without clear precipitant.  It was suggested that she increase lithium to 450 mg daily check a lithium level.  seen July 15, 2019.  She was having more depression.  The following changes were made. Rec increase lithium to 600 mg daily for a week and check blood level. If this fails to help or is intolerable then recommend lamotrigine trial as noted. She continued Ritalin 20 mg 3 times daily per usual  No lithium level nor BMP were received.  seen August 20 2019.  She was still dealing with depression and had not seen any improvement in depression at 600 mg versus 450 mg daily.  She did not get a blood level. Reduce lithium to 450 mg daily DT NR at 600 daily  recommend lamotrigine trial as noted.  She called back December 30 stating she had been on lamotrigine for 2 weeks and was noticing a tremor and wondered if it was related.  BC of tremor she went down again from 450 mg in lithium and had reduced it to 300 mg instead of 450 mg.  seen October 15, 2019.  The following was noted: CO  Worsening tremor intentional affecting mouse use about December 30.  Wonders about essential tremor vs TD.   Thyroid  checked a couple months ago and dose was not changed.  Tremor in head interfered with dental procedures some.  Alcohol does not help tremor and  Never has.   Essential tremor dx decades ago. Mood is better with lamotrigine 100 mg daily.  Mood more level.   Plan: No meds were changed.  She continued the following: Continue lamotrigine 100 mg daily as it was helpful. Continue Ritalin as prescribed. Continue lithium 300 mg nightly.  So far her mood is been stable with a lower dose Not using much propranolol.  01/12/2020 appointment, the following is noted: Questions about the propranolol.  Still has sig tremor that varies in intensity from mild to severe.  Not always bothered by it unless doing things with fine motor control like writing.  Has taken up to 20 mg propranolol and not more bc ? GI SE.   Mood still good.  Dep controlled.  Anxiety hard to tell bc D with recurrent breast CA. Plan no meds changed.  04/13/2020 appointment with the following noted: Propranolol with limited effect on tremor even up to 50 mg daily.  Hard time writing. Worse with guilty, anxious depressed thoughts.  Neg self talk.  Not sure if anxiety triggered neg thinking.  No change stress or meds.  Sleep is disjointed.  Plan: Increase lamotrigine to 150 mg daily as it was helpful for several months at 100. Continue Ritalin as prescribed. Continue lithium  300 mg nightly.   05/25/2020 appointment with the following noted: After increased lamotrigine to mood overall has been very good with mood.  Bad part is tremor has gotten much worse with intention.  Wonders about neurologist. No med changes  08/24/2020 appointment with the following noted:  Disc lithium and kidney function and questions about CKD 3. Missed neuro appt bc got sick right before it.  RS for FEB.  But opening for tomorrow.  Tremor is increasing . Insomnia is worse.  Trouble going to sleep for a couple of hours.  3 nights per week. Racing thoughts all over  the place.  Some are regretful thoughts from the past but random things.  Don't have the thoughts in the day. No naps. Cut caffeine max 2 cups coffee as late as 11 AM. Ritalin as late as 630 pm and usually that won't bother sleep.  Kids think her ADD meds aren't working well.  Scattered and hard to stay on task.   Not hyper spending or otherwise hyper.  Always tendency to talk if someone to listen. Can internally feel tense.  Not irritable. Doesn't want more meds. Pretty good with depression. Plan: The increase lamotrigine to 150 mg daily resolved the depression.  it was helpful for several months at 100. Continue Ritalin as prescribed 3/4 tablet QID.  Prefers Malincroft generic. She got different brand Accord of methylphenidate and it shatters when tries to quarter it. Continue lithium 300 mg nightly.  Agree with her plans to see neurology.  11/22/2020 appointment with following noted: Neurology diagnosed essential tremor with possible exacerbation by lithium and anxiety and suggested at the moment to withhold treatment for fear of worsening her balance and because the tremor was rather mild on exam.  MRI showed age-appropriate moderate chronic white matter disease. Pt doesn't think her tremor is mild as did neuro.  Worse with writing.  Disc neuro consult.  Disc propranolol dosing and risk of depression at higher dose.  Tremor noticeable in AM before coffee and affects writing and typing. Depression is under control.  Anxiety a little worse and experienced physically.  If get anxious then it builds on itself. Denies appetite disturbance.  Patient reports that energy and motivation have been good.  Patient denies any difficulty with concentration. Patient denies any suicidal ideation. Plan: Retry propranolol per her request 40 mg twice daily for a few days and if no benefit then increase to 50 mg twice daily for a few days. Can increase every few days by 10 mg to a max of 80 mg twice daily as long  as there are no side effects and you monitor blood pressure and pulse without significant changes or problems. The increase lamotrigine to 150 mg daily resolved the depression.  it was helpful for several months at 100. Continue Ritalin as prescribed 3/4 tablet QID.  Prefers Malincroft generic. She got different brand Accord of methylphenidate and it shatters when tries to quarter it. Continue lithium 300 mg nightly.   01/25/2021 appointment with the following noted: Difficult time adjusting to and fearful of changing meds.  Had number of stressors.   Since here # stressors.  GS rehab alcohol and bipolar disorder.   Recognizes pattern of avoidance in her life.  Plans to still try the propranolol bc the tremor is better. Patient reports stable mood and denies depressed or irritable moods.   Patient denies difficulty with sleep initiation or maintenance. Denies appetite disturbance.  Patient reports that energy and motivation have been good.  Patient denies any difficulty with concentration.  Patient denies any suicidal ideation.  Consistent with meds. Pllan: Retry propranolol when she feel ready per her request 40 mg twice daily for a few days and if no benefit then increase to 50 mg twice daily for a few days. Can increase every few days by 10 mg to a max of 80 mg twice daily as long as there are no side effects and you monitor blood pressure and pulse without significant changes or problems. It can help both tremor and anxiety. The increase lamotrigine to 150 mg daily resolved the depression.  it was helpful for several months at 100. Continue Ritalin as prescribed 3/4 tablet QID.  Prefers Malincroft generic. She got different brand Accord of methylphenidate and it shatters when tries to quarter it. Continue lithium 300 mg nightly.   05/11/2021 appointment with the following noted: Continues lamotrigine 150 mg daily, lithium 300 mg daily, Ritalin 20 mg 3 times daily.  Has not been taking  propranolol which was prescribed last visit for tremor and anxiety.  She never tried it.  She didn't feel it was bad enough to warrant treatment.  Tremor affects use of mouse.  Affects writing.   Has recurrence of obsessive thinking and trouble sleeping for a couple of mos.  Obs thoughts on things she's done or not done and that she's responsible for something bad that might happen..  Obs interfere with sleep and are worse.  Last night after 330 before asleep and awake 730.  Never nap and not really tired in the daytime.   Periods of several days in a row of Energizer bunny and then resolves. Plan: Start Trileptal 150 mg HS for 5 days then 300 mg HS.  If not better in a couple of weeks call.  07/10/2021 appointment with the following noted: Disc BP this am.  Not checking it at home.  Is having some back and leg pain today which might increase BP. Really helped anxiety with Trileptal 300 mg nightly.  Choose to go to sleep late.   Avg 4-7 hours sleep. Patient reports stable mood and denies depressed or irritable moods.  Patient denies any recent difficulty with anxiety.  Patient denies difficulty with sleep initiation or maintenance. Denies appetite disturbance.  Patient reports that energy and motivation have been good.  Patient denies any difficulty with concentration.  Patient denies any suicidal ideation. SE always kind of clutsy but may be a little more.  No balance issues in the day. Plan: The increase lamotrigine to 150 mg daily resolved the depression.  it was helpful for several months at 100. Continue Trileptal 300 mg nightly Continue lithium 300 mg nightly Continue Ritalin 20 mg tablets 3/4 to 1 tablet 3 times daily  10/09/2021 appointment with the following noted: 3 issues.  Main concerns are cognitive and tremor. 1 is cognitive seems atypical like with numbers like figuring years.  Computation.   More trouble folding fitted sheets.  Thinks it is worse over 3-6 mos.   No sig trouble paying  bills or doing checking account.  Pays online Has to reread recipes over longer period of time. Not as motivated for self care and getting out of the house.  Not markedly depressed otherwise. Anxiety is still better with Trileptal, but will tend to worry over somatic concerns and think the worst. Sleep is pretty good and sleeping later than usual. Trmor is worse.  Neuro was somewhat dismissive of tremor.  Remote dx of essential tremor Lithium  is successful for mood. Plan; no med changes except reduced lithium to 150 mg daily to see if tremor would be better.  Continue lamotrigine 150, Ritalin 20 mg tablets 3/4 tablets 3 times daily, oxcarbazepine 300 mg nightly,   12/07/21 appt noted: Tremor no better with less lithium. More mood cycling from depression to highs with energizer bunny to anxiety with sense of doom.   Periods of flashing or intrusive thoughts she had done something wrong to new great grand child. Still prefers Mal[employer]roft generic Ritalin. Plan: She wanted to try lower dose lithium 150 mg daily and this did not help tremor and mood cycling is worse, so increase lithium bak to300 mg daily.  03/05/2022 appointment with the following noted: Continues lamotrigine 150 mg daily, lithium 300 mg daily, oxcarbazepine 300 mg nightly, Ritalin 20 mg 3 times daily Last still having obsessive intrusive thoughts without reason.  She thinks it's related  to meds but reviewed med sequencing of changes do not provide an obvious med pcpt.   Obsessions primarily about doing something wrong that may harm others.  Can cause sleeplessness and panic feelings. Having insomnia initially with anxious thoughts.    No depression periods.  No hyperactive periods. Tremor seems worse affecting  typing. No PCP now.   Plan: increase oxcarb 450 HS  05/24/2022 appointment noted: Continues lamotrigine 150 mg daily, lithium 300 mg nightly, Trileptal 450 mg nightly, Ritalin 20 mg 3 times daily. Better with increase  oxcarbazepine 450 mg HS with anxiety and insomnia. Last couple of weeks exceptionally organized and motivated.   Sleep is good but tendency to stay up late.  No racing thoughts at night.  No reckless impulsive behavior. No depression. Anxiety is better with oxcarb No sig SE. To [place] to visit lifelong friend next week. Plan no med changes  08/23/22 appt noted: No med changes: lamotrigine 150 mg daily, lithium 300 mg nightly, Trileptal 450 mg nightly, Ritalin 20 mg 3 times daily. Doing OK.  Overall fairly stable.  Holiday mode.  Indecisiveness and procrastinates at this time of year.   She has 25 people at her house at Christmas.   Sleep is good overall without further insomnia.  No mania.   Depression is under control.  Usually has it in November but not this year.   No sig SE. PCP referred her to Dr. Carles Collet at her request and they wouldn't accept her bc already seen another neuro.  Tremor has gotten worse with more trouble using the mouse.  She is aware of essential tremor weighted glove and gets the newsletter.  Functional.  Holter monitor occ SVT and rare VT but not requiring treatment  H died 4 years ago  Youngest D relapse breast CA.  Stressful.  New great grand D [place].  Psych med history:  Abilify no response,  Mellaril,  Refuses Seroquel bc F problems with it.  Nardil hyper and goofy, Prozac with side effects, try cyclic antidepressants, Viibryd, Trintellix caused anxiety,  Wellbutrin which caused tremors and goofy which Lexapro toned down, imipramine, Pristiq 100,  Vyvanse, Ritalin, history of Dexedrine during pregnancy which caused weight gain,  Cerefolin NAC,   lithium 600 tremor worse (started lithium 150 mg Nov 2018) We tried an increase lithium to 600 mg daily and check a blood level.   The increase in lithium did not help her mood and her made her tremor worse.  Lamotrigine 150 Trileptal 300 helped anxiety Propranolol 50 without help for tremor  Remote hosp for  obs  on dangerousness irrationally about water in a vase. and panic at [facility] and was on Nardil at the time. F took Seroquel for hallucinations with PD and associated dementia and SE  Review of Systems:  Review of Systems  Constitutional:  Negative for fatigue.  Cardiovascular:  Negative for palpitations.  Gastrointestinal:  Negative for abdominal pain and nausea.  Neurological:  Positive for tremors. Negative for dizziness.       Tremor worse at lithium 600 mg daily versus lithium 450 mg daily  Psychiatric/Behavioral:  Positive for sleep disturbance. Negative for agitation, behavioral problems, confusion, decreased concentration, dysphoric mood, hallucinations, self-injury and suicidal ideas. The patient is nervous/anxious. The patient is not hyperactive.     Medications: I have reviewed the patient's current medications.  Current Outpatient Medications  Medication Sig Dispense Refill   Cetirizine HCl (ZYRTEC ALLERGY) 10 MG CAPS 1 capsule     lamoTRIgine (LAMICTAL) 150 MG tablet Take 1 tablet (150 mg total) by mouth daily. 90 tablet 0   levothyroxine (SYNTHROID) 50 MCG tablet Take 50 mcg by mouth daily.     lithium carbonate 300 MG capsule TAKE ONE CAPSULE BY MOUTH DAILY 90 capsule 0   methylphenidate (RITALIN) 20 MG tablet Take 1 tablet (20 mg total) by mouth 3 (three) times daily with meals. 270 tablet 0   OXcarbazepine (TRILEPTAL) 150 MG tablet Take 3 tablets (450 mg total) by mouth at bedtime. 270 tablet 0   No current facility-administered medications for this visit.    Medication Side Effects: tremor is affecting handwriting and computer worse with 600 mg lithium..  Numbness in toes.  Hands and feet always cold.  Allergies:  Allergies  Allergen Reactions   Codeine Other (See Comments)    Abdominal pain  Other reaction(s): stomach upset   Latex Hives    Other reaction(s): clammy, "feeling horrible"   Septra [Sulfamethoxazole-Trimethoprim] Rash   Sulfa Antibiotics  Rash and Other (See Comments)    Other reaction(s): rash    Past Medical History:  Diagnosis Date   ADD (attention deficit disorder)    Basal cell carcinoma    Depression    Diverticulitis    Hypothyroid    d/t lithium   Osteopenia 11/2017   T score -1.1 FRAX 24% / 14%   Peripheral neuropathy    Tremor, essential    Vitamin D deficiency 12/2017    Family History  Problem Relation Age of Onset   Hypertension Mother    Thyroid disease Mother    Breast cancer Mother 77   Breast cancer Daughter 69   Thyroid disease Daughter    Other Father        Brain tumor   Parkinson's disease Father    Dementia Father    Pneumonia Father    Cancer Daughter        Melanoma   Heart disease Paternal Grandfather    Leukemia Maternal Grandmother    Stomach cancer Paternal Grandmother     Social History   Socioeconomic History   Marital status: Widowed    Spouse name: Not on file   Number of children: 3   Years of education: Not on file   Highest education level: Bachelor's degree (e.g., BA, AB, BS)  Occupational History    Comment: retired  Tobacco Use   Smoking status: Former    Packs/day: 0.50    Years: 30.00    Total pack years: 15.00    Types: Cigarettes    Quit date: 2012  Years since quitting: 11.9   Smokeless tobacco: Never  Vaping Use   Vaping Use: Never used  Substance and Sexual Activity   Alcohol use: Not Currently    Comment: 2 glasses of wine nightly   Drug use: Never   Sexual activity: Not Currently    Birth control/protection: Post-menopausal    Comment: 1st intercourse 81 yo-Fewer than 5 partners  Other Topics Concern   Not on file  Social History Narrative   Lives alone   Caffeine- 1 c coffee   Social Determinants of Health   Financial Resource Strain: Not on file  Food Insecurity: Not on file  Transportation Needs: Not on file  Physical Activity: Not on file  Stress: Not on file  Social Connections: Not on file  Intimate Partner Violence:  Not on file    Past Medical History, Surgical history, Social history, and Family history were reviewed and updated as appropriate.   Please see review of systems for further details on the patient's review from today.   Objective:   Physical Exam:  BP (!) 121/58   Pulse 88   Physical Exam Constitutional:      General: She is not in acute distress.    Appearance: She is well-developed and normal weight.  Musculoskeletal:        General: No deformity.  Neurological:     Mental Status: She is alert and oriented to person, place, and time.     Cranial Nerves: No dysarthria.     Motor: Tremor present.     Coordination: Coordination normal.  Psychiatric:        Attention and Perception: Perception normal. She is inattentive. She does not perceive auditory or visual hallucinations.        Mood and Affect: Mood is not anxious or depressed. Affect is not labile, blunt, angry or tearful.        Speech: Speech normal.        Behavior: Behavior normal. Behavior is cooperative.        Thought Content: Thought content is not paranoid or delusional. Thought content does not include homicidal or suicidal ideation. Thought content does not include suicidal plan.        Cognition and Memory: Cognition and memory normal.        Judgment: Judgment normal.     Comments: Good insight Not currently manic or depressed  Lately anxiety and obs thoughts are better   objectively the patient does not show significant mouth movements but she displayed video at prior visit in which typical mouth movements cheek movements associated with tardive dyskinesia was evident.  Her tremor is rhythmic and is not supportive of a tardive dyskinesia type tremor. Relatively mild facial movements at this time.  Lab Review:     Component Value Date/Time   NA 139 08/25/2020 1410   K 4.3 08/25/2020 1410   CL 103 08/25/2020 1410   CO2 22 08/25/2020 1410   GLUCOSE 85 08/25/2020 1410   BUN 11 08/25/2020 1410    CREATININE 0.93 08/25/2020 1410   CALCIUM 9.7 08/25/2020 1410   PROT 6.7 08/25/2020 1410   ALBUMIN 4.3 08/25/2020 1410   AST 22 08/25/2020 1410   ALT 16 08/25/2020 1410   ALKPHOS 69 08/25/2020 1410   BILITOT 0.5 08/25/2020 1410   GFRNONAA 59 (L) 08/25/2020 1410   GFRAA 69 08/25/2020 1410    No results found for: "WBC", "RBC", "HGB", "HCT", "PLT", "MCV", "MCH", "MCHC", "RDW", "LYMPHSABS", "MONOABS", "EOSABS", "BASOSABS"  No  results found for: "POCLITH", "LITHIUM"   No results found for: "PHENYTOIN", "PHENOBARB", "VALPROATE", "CBMZ"   She says lithium level was 0.6 at PCP on 450 mg daily.   07/2020 lithium low therapeutic by her report [facility]  Specifically her lithium level May 17, 2019 on this dosage of lithium was 0.3.  Her urinalysis was normal she had a normal CBC, comprehensive metabolic panel was normal including creatinine 0.83 calcium 9.9 and TSH was normal at 2.83.  Lithium level in April 2019 was 0.2 at this dosage.  We will not increase the lithium because she is currently having tremor at the low dose.  .res Assessment: Plan:    Depression, major, recurrent, in partial remission ([facility])  Generalized anxiety disorder  Attention deficit hyperactivity disorder (ADHD), predominantly inattentive type  Tremor, essential  Obsessional thoughts of harming others  Tardive dyskinesia  Lithium use   .Greater than 50% of 30-minute face to face time with patient was spent on counseling and coordination of care. We discussed Katrina Gamble has a long history over decades of cycling major depression and treatment resistance to meds noted above of nearly every antidepressant category.  Was more anxious and obsessive with mixed type sx until increase oxcarbazepine 450 mg HS.  Pt has had what she describes as mild hypomanic episodes in the past in response to antidepressants primarily.  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  Not been depressed lately with increase lamotrigine and less anxiety with Trileptal. Mood cycling worse with lithium reduction from 300 to 150 mg and better with 300 mg. While she does not identify herself is bipolar because her upswings seem only normal to her.  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  Overall is much more stable with current med regimen than she has been in the past.  So no changes indicated.  Obsessions she made a mistake which will cause harm to others are better.  Lowest risk mood stabilizing med should be Trileptal.  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   Benefit Trileptal for anxiety and somewhat for sleep even better with increase. Continue Trileptal to 450  mg HS for anxiety and insomnia.   Her best response so far has been from low-dose lithium with a stimulant but lamotrigine has seemed to help for 6 mos in 2020 until relapse in Summer.  The depression resolved again with the increase in lamotrigine from 100 to 150 mg daily.  They do not follow any pattern and do not appear to have any precipitant.   Cognitive complaints but not substantially impairing at this time.  She doesn't want cognitive enhancing meds.  Cerefolin NAC for a month with unclear effects. Rec trial Disc the off-label use of N-Acetylcysteine at 600 mg daily to help with mild cognitive problems.  It can be combined with a B-complex vitamin as the B-12 and folate have been shown to sometimes enhance the effect.  Gave her samples in the past.  We tried an increase lithium to 600 mg daily and check a blood level.   The increase in lithium did not help her mood and her made her tremor worse. Counseled patient regarding potential benefits, risks, and  side effects of lithium to include potential risk of lithium affecting thyroid and renal function.  Discussed need for periodic lab monitoring to determine drug level and to assess for potential adverse effects.  Counseled patient regarding signs and symptoms of lithium toxicity and advised that they notify office immediately or seek urgent medical attention if experiencing these signs and symptoms.  Patient advised to contact office with any questions or concerns.    The tremor predates lithium and predates stimulant use but appears to have been made worse by the increase in lamotrigine.  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  She saw a neurologist. Does not really want to add meds for tremor.  the increase lamotrigine to 150 mg daily resolved the depression.  it was helpful for several months at 100. Consider reducing lamotrigine to see if obs thoughts get better  Consider primidone for tremor.  On lithium 300 mg nightly.  It appears she cannot tolerate a higher dose.  Consider reduction to reduce tremor possibly given improvement from lamotrigine. She wanted to try lower dose lithium 150 mg daily and this did not help tremor and mood cycling is worse, so increase lithium back to 300 mg daily.  Discussed potential benefits, risks, and side effects of stimulants with patient to include increased heart rate, palpitations, insomnia, increased anxiety, increased irritability, or decreased appetite.  Instructed patient to contact office if experiencing any significant tolerability issues. Pleased she got the [place] generic Ritalin bc easier to cut. Continue Ritalin as prescribed 3/4 tablet QID.  Prefers [place] generic. She got different brand [place] of methylphenidate and it shatters when tries to quarter it.  Continue  Vitamin D. Disc reasons for doing so for mental and physical health in detail.  She has stopped bc she hates taking pills.  Supportive therapy dealing with GS's  mental health problems.  She is changing pharmacies and insurance with January  No med changes: lamotrigine 150 mg daily, lithium 300 mg nightly, Trileptal 450 mg nightly, Ritalin 20 mg 3 times daily.  FU  3 mos  Katrina Parents, MD, DFAPA '   Please see After Visit Summary for patient specific instructions.  No future appointments.    No orders of the defined types were placed in this encounter.      -------------------------------

## 2022-08-27 ENCOUNTER — Other Ambulatory Visit: Payer: Self-pay | Admitting: Psychiatry

## 2022-08-27 DIAGNOSIS — F3341 Major depressive disorder, recurrent, in partial remission: Secondary | ICD-10-CM

## 2022-08-27 DIAGNOSIS — F411 Generalized anxiety disorder: Secondary | ICD-10-CM

## 2022-09-12 ENCOUNTER — Other Ambulatory Visit: Payer: Self-pay

## 2022-09-12 ENCOUNTER — Telehealth: Payer: Self-pay | Admitting: Psychiatry

## 2022-09-12 DIAGNOSIS — F332 Major depressive disorder, recurrent severe without psychotic features: Secondary | ICD-10-CM

## 2022-09-12 MED ORDER — LAMOTRIGINE 150 MG PO TABS: 150.0000 mg | ORAL_TABLET | Freq: Every day | ORAL | 0 refills | Status: DC

## 2022-09-12 NOTE — Telephone Encounter (Signed)
Next visit is 11/21/21. Nance called requesting a refill on her Lamotrigine 150 mg called to:  [facility],  [address], [place], [place] [address], phone number is [phone]

## 2022-09-12 NOTE — Telephone Encounter (Signed)
RX SENT

## 2022-09-25 DIAGNOSIS — L814 Other melanin hyperpigmentation: Secondary | ICD-10-CM | POA: Diagnosis not present

## 2022-09-25 DIAGNOSIS — L659 Nonscarring hair loss, unspecified: Secondary | ICD-10-CM | POA: Diagnosis not present

## 2022-09-25 DIAGNOSIS — D1801 Hemangioma of skin and subcutaneous tissue: Secondary | ICD-10-CM | POA: Diagnosis not present

## 2022-09-25 DIAGNOSIS — L570 Actinic keratosis: Secondary | ICD-10-CM | POA: Diagnosis not present

## 2022-09-25 DIAGNOSIS — D229 Melanocytic nevi, unspecified: Secondary | ICD-10-CM | POA: Diagnosis not present

## 2022-09-25 DIAGNOSIS — L821 Other seborrheic keratosis: Secondary | ICD-10-CM | POA: Diagnosis not present

## 2022-09-25 DIAGNOSIS — L578 Other skin changes due to chronic exposure to nonionizing radiation: Secondary | ICD-10-CM | POA: Diagnosis not present

## 2022-09-25 DIAGNOSIS — D239 Other benign neoplasm of skin, unspecified: Secondary | ICD-10-CM | POA: Diagnosis not present

## 2022-09-25 DIAGNOSIS — B078 Other viral warts: Secondary | ICD-10-CM | POA: Diagnosis not present

## 2022-10-03 ENCOUNTER — Telehealth: Payer: Self-pay | Admitting: Psychiatry

## 2022-10-03 ENCOUNTER — Telehealth: Payer: Self-pay

## 2022-10-03 DIAGNOSIS — F900 Attention-deficit hyperactivity disorder, predominantly inattentive type: Secondary | ICD-10-CM

## 2022-10-03 DIAGNOSIS — F3341 Major depressive disorder, recurrent, in partial remission: Secondary | ICD-10-CM

## 2022-10-03 MED ORDER — METHYLPHENIDATE HCL 20 MG PO TABS: 20.0000 mg | ORAL_TABLET | Freq: Three times a day (TID) | ORAL | 0 refills | Status: DC

## 2022-10-03 NOTE — Telephone Encounter (Signed)
Pended.

## 2022-10-03 NOTE — Telephone Encounter (Signed)
PT called and said that she needs [facility]italin [facility] on [address]

## 2022-10-05 ENCOUNTER — Other Ambulatory Visit: Payer: Self-pay | Admitting: Psychiatry

## 2022-10-05 ENCOUNTER — Other Ambulatory Visit: Payer: Self-pay

## 2022-10-05 ENCOUNTER — Other Ambulatory Visit (HOSPITAL_COMMUNITY): Payer: Self-pay

## 2022-10-05 DIAGNOSIS — F900 Attention-deficit hyperactivity disorder, predominantly inattentive type: Secondary | ICD-10-CM

## 2022-10-05 DIAGNOSIS — F3341 Major depressive disorder, recurrent, in partial remission: Secondary | ICD-10-CM

## 2022-10-05 MED ORDER — METHYLPHENIDATE HCL 20 MG PO TABS
20.0000 mg | ORAL_TABLET | Freq: Three times a day (TID) | ORAL | 0 refills | Status: DC
  Filled 2022-10-05: qty 90, 30d supply, fill #0

## 2022-10-05 NOTE — Telephone Encounter (Signed)
[facility].  Yes she gets 90 day #270, if they have it or if needed we could just get 30 d

## 2022-10-05 NOTE — Telephone Encounter (Signed)
I will call [facility] for pt,but does rx have to be a 90 day?Just in case they don't have that quantity

## 2022-10-05 NOTE — Telephone Encounter (Signed)
Pt lvm that her pharmacy will not have her ritalin in stock until next Thursday. She said they will not transfer script. She needs help finding out what to do. Please call her at [phone]

## 2022-10-09 DIAGNOSIS — J069 Acute upper respiratory infection, unspecified: Secondary | ICD-10-CM | POA: Diagnosis not present

## 2022-10-09 DIAGNOSIS — Z03818 Encounter for observation for suspected exposure to other biological agents ruled out: Secondary | ICD-10-CM | POA: Diagnosis not present

## 2022-10-10 ENCOUNTER — Telehealth: Payer: Self-pay

## 2022-10-10 NOTE — Telephone Encounter (Addendum)
Prior Authorization Methylphenidate 20 mg #30 [facility] Medicare  Approved. This approval is for 09/10/2022 until further notice.   This drug has been approved under the Member's Medicare Part D benefit. Approved quantity: 90 units per 30 day(s). You may fill up to a 90 day supply except for those on Specialty Tier 5, which can be filled up to a 30 day supply.

## 2022-10-16 ENCOUNTER — Encounter: Payer: Self-pay | Admitting: Neurology

## 2022-10-16 ENCOUNTER — Ambulatory Visit (INDEPENDENT_AMBULATORY_CARE_PROVIDER_SITE_OTHER): Payer: Medicare Other | Admitting: Neurology

## 2022-10-16 VITALS — BP 130/60 | HR 92 | Ht 65.0 in | Wt 136.8 lb

## 2022-10-16 DIAGNOSIS — G250 Essential tremor: Secondary | ICD-10-CM | POA: Diagnosis not present

## 2022-10-16 DIAGNOSIS — R2689 Other abnormalities of gait and mobility: Secondary | ICD-10-CM | POA: Diagnosis not present

## 2022-10-16 NOTE — Patient Instructions (Signed)
It was nice to see you again today.  For your balance issues, I recommend that you drink more water, 6 to 8 cups/day are recommended, 8 ounce size each.  Please limit your caffeine intake, please reduce your alcohol consumption to not daily, up to 1 glass of wine or less per day, 4 ounce size each. I would recommend that you start using your cane and/or walker for gait safety as you feel unsteady at times.  I would not recommend any new medications for you at this time. Please follow-up with your current providers.

## 2022-10-16 NOTE — Progress Notes (Signed)
Subjective:    Patient ID: Katrina Gamble is a 82 y.o. female.  HPI    Interim history:   Katrina Gamble is a 82 year old right-handed woman with an underlying medical history of vitamin D deficiency, osteopenia, depression, anxiety, and ADD, who presents for follow-up consultation of her tremor and balance issues.  The patient is unaccompanied today and presents for reevaluation.  I had evaluated her for her tremor and balance issues a little over 2 years ago at the request of her psychiatrist.  We talked about her complex issues including taking multiple psychotropic medications, some of which could exacerbate tremors.  We talked about tremor triggers in general.  We proceeded with a brain MRI to rule out a structural cause of her symptoms.  She had a brain MRI with and without contrast through [facility] on 09/13/2020 which showed moderate chronic white matter disease, otherwise normal findings.  Today, 10/16/2022: She reports ongoing issues with tremors, her tremors have become worse over time.  She has had more difficulty writing especially financial papers or writing checks.  She has tried weighted utensils but ultimately she does not have any great improvement in her tremor.  Her balance has been worse over time as well.  She has not fallen recently thankfully.  She does not feel like she gets off balance especially if she has to turns suddenly or get up quickly.  Of note, she is currently on lithium, methylphenidate, oxcarbazepine, Lamictal, and Synthroid generic.  She admits that she does not drink a whole lot of water, estimates that she drinks about 4, at the most 5 cups of water per day.  She drinks alcohol daily in the form of wine, 1 or 2 glasses/day in the evenings, usually 4 ounce size.  She limits her caffeine to about 2 cups of coffee in the morning.  She has a cane and a walker but does not use them typically.  She tries to exercise, gets more exercise when it is warmer outside as  she likes to work in the yard.  The patient's allergies, current medications, family history, past medical history, past social history, past surgical history and problem list were reviewed and updated as appropriate.   Previously:   08/25/20: 82 year old right-handed woman with an underlying medical history of vitamin D deficiency, osteopenia, depression, anxiety, and ADD, who reports a longstanding history of tremor affecting both upper extremities for years.  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  She reports that it was not effective.  I was able to renew old neurological records in our old medical record system, she saw Dr. Doy Mince on 06/11/2005 and was felt to have a mild case of essential tremor and low-dose propranolol at 10 mg strength up to 2 tablets as needed was recommended at the time.  She was seen in follow-up by the nurse practitioner on 10/03/2005, at which time she reported that her tremor was much better since starting the Enbrel and that her head bobbing was completely gone.   I reviewed your office note from 05/25/2020, as well as 08/24/2020.  Of note, she is on multiple medications including Lamictal, Synthroid, lithium, vitamin.  In the past, she has been on other medications including Abilify, Mellaril, Nardil, Prozac, Wellbutrin, Vyvanse, propranolol.  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   She had a head CT without contrast on 06/27/2015 which showed: Impression: No acute intracranial pathology.  She reports that her tremor improved over time.  She is no longer on propranolol.  She does not have a family history of essential tremor but her father had Parkinson's disease.  She has not fallen recently but has had over time some balance problems, feels like she veers to one side, typically the left side while walking.  She denies any orthostatic lightheadedness or vertigo  symptoms. She quit smoking in 2012, drinks alcohol in the form of wine, 2 4 ounce glasses 5 times a week.  She drinks caffeine in the form of regular coffee, 1 or 2 cups/day and 1 or 2 cups of decaf coffee per day, admits that she does not always hydrate well with water.  She is widowed and lives alone.  She has 3 grown children.  She feels that her handwriting has deteriorated over time.  She did fill out our new patient paperwork and has been able to print with very legible handwriting, no micrographia noted, but fairly consistent trembling.  She reports that it took her hours to write it. She retired as a Estate manager/land agent.   She reports that she has been on lithium for years, probably since 2019.  When it was increased to 600 mg daily she noticed that significant increase in her tremor.  Lithium was reduced to 450 mg daily and then to 300 mg daily which she is on currently.    Her Past Medical History Is Significant For: Past Medical History:  Diagnosis Date   ADD (attention deficit disorder)    Basal cell carcinoma    Depression    Diverticulitis    Hypothyroid    d/t lithium   Osteopenia 11/2017   T score -1.1 FRAX 24% / 14%   Peripheral neuropathy    Tremor, essential    Vitamin D deficiency 12/2017    Her Past Surgical History Is Significant For: Past Surgical History:  Procedure Laterality Date   APPENDECTOMY  1960   CATARACT EXTRACTION, BILATERAL  2021   [facility]    Her Family History Is Significant For: Family History  Problem Relation Age of Onset   Hypertension Mother    Thyroid disease Mother    Breast cancer Mother 69   Other Father        Brain tumor   Parkinson's disease Father    Dementia Father    Pneumonia Father    Leukemia Maternal Grandmother    Stomach cancer Paternal Grandmother    Heart disease Paternal Grandfather    Breast cancer Daughter 76   Thyroid disease Daughter    Cancer Daughter        Melanoma    Tremor Neg Hx     Her Social History Is Significant For: Social History   Socioeconomic History   Marital status: Widowed    Spouse name: Not on file   Number of children: 3   Years of education: Not on file   Highest education level: Bachelor's degree (e.g., BA, AB, BS)  Occupational History    Comment: retired  Tobacco Use   Smoking status: Former    Packs/day: 0.50    Years: 30.00    Total pack years: 15.00    Types: Cigarettes    Quit date: 2012    Years since quitting: 12.1   Smokeless tobacco: Never  Vaping Use   Vaping Use: Never used  Substance and Sexual Activity   Alcohol use: Not Currently    Alcohol/week:  10.0 standard drinks of alcohol    Types: 10 Glasses of wine per week   Drug use: Never   Sexual activity: Not Currently    Birth control/protection: Post-menopausal    Comment: 1st intercourse 82 yo-Fewer than 5 partners  Other Topics Concern   Not on file  Social History Narrative   Lives alone   Caffeine- 1 c coffee   Social Determinants of Health   Financial Resource Strain: Not on file  Food Insecurity: Not on file  Transportation Needs: Not on file  Physical Activity: Not on file  Stress: Not on file  Social Connections: Not on file    Her Allergies Are:  Allergies  Allergen Reactions   Codeine Other (See Comments)    Abdominal pain  Other reaction(s): stomach upset   Latex Hives    Other reaction(s): clammy, "feeling horrible"   Septra [Sulfamethoxazole-Trimethoprim] Rash   Sulfa Antibiotics Rash and Other (See Comments)    Other reaction(s): rash  :   Her Current Medications Are:  Outpatient Encounter Medications as of 10/16/2022  Medication Sig   Cetirizine HCl (ZYRTEC ALLERGY) 10 MG CAPS 1 capsule   lamoTRIgine (LAMICTAL) 150 MG tablet Take 1 tablet (150 mg total) by mouth daily.   levothyroxine (SYNTHROID) 50 MCG tablet Take 50 mcg by mouth daily.   lithium carbonate 300 MG capsule TAKE ONE CAPSULE BY MOUTH DAILY    methylphenidate (RITALIN) 20 MG tablet Take 1 tablet (20 mg total) by mouth 3 (three) times daily with meals.   OXcarbazepine (TRILEPTAL) 150 MG tablet TAKE THREE TABLETS BY MOUTH EVERY NIGHT AT BEDTIME   No facility-administered encounter medications on file as of 10/16/2022.  :  Review of Systems:  Out of a complete 14 point review of systems, all are reviewed and negative with the exception of these symptoms as listed below:  Review of Systems  Neurological:        Pt here for increased tremors and decline in gait  Pt states increased tremors in right hand Pt states no falls in last six months     Objective:  Neurological Exam  Physical Exam Physical Examination:   Vitals:   10/16/22 1020  BP: 130/60  Pulse: 92    General Examination: The patient is a very pleasant 82 y.o. female in no acute distress. She appears well-developed and well-nourished and well groomed.   HEENT: Normocephalic, atraumatic, pupils are equal, round and reactive to light, extraocular tracking is well preserved, no obvious nystagmus noted, hearing is grossly intact.  Face is symmetric with normal facial animation, speech with intermittent slight voice tremor.  She has a mild side-to-side head tremor which is also intermittent.  Airway examination reveals moderate mouth dryness, tongue protrudes centrally and palate elevates symmetrically.   Chest: Clear to auscultation without wheezing, rhonchi or crackles noted.   Heart: S1+S2+0, regular and normal without murmurs, rubs or gallops noted.    Abdomen: Soft, non-tender and non-distended.   Extremities: There is no obvious edema in the distal lower extremities bilaterally. Pedal pulses are intact.   Skin: Warm and dry without trophic changes noted.   Musculoskeletal: exam reveals no obvious joint deformities, tenderness or joint swelling or erythema, with the exception of arthritic changes in her hands.    Neurologically:  Mental status: The patient is  awake, alert and oriented in all 4 spheres. Her immediate and remote memory, attention, language skills and fund of knowledge are appropriate. There is no evidence of aphasia, agnosia, apraxia  or anomia. Speech is clear with normal prosody and enunciation. Thought process is linear. Mood is normal and affect is normal.  Cranial nerves II - XII are as described above under HEENT exam.   She has no resting tremor, very slight but lateral upper extremity postural and slight action tremor, no significant intention tremor.  No lower extremity tremor noted.   (Handwriting sample from 2006 which she brought for comparison showed severe trembling and nearly illegible handwriting.)  (08/25/20: Handwriting today shows no micrographia, moderate trembling, she holds the pen between digit 2 and 3 rather than between the thumb and the index finger.  She feels that her pen is more stable that way.) On 10/16/2022: Handwriting is coarsely tremulous, rather large, difficult to read.  On Archimedes spiral drawing she has a coarse very large tremor with the right hand and significant insecurity with the left hand but less tremor.  Motor exam: Thin bulk, global strength normal, no involuntary movements otherwise.  Romberg is not tested for safety concerns.  Fine motor skills grossly normal otherwise.   Cerebellar testing: No dysmetria or intention tremor. There is no truncal or gait ataxia.  Sensory exam: intact to light touch.  Gait, station and balance: She stands without major difficulty, posture is age-appropriate, she walks slightly insecurely and has a tendency to look down, preserved arm swing noted, turns mildly insecurely.    Assessment and Plan:    In summary, Katrina Gamble is a 82 year old right-handed woman with an underlying medical history of vitamin D deficiency, osteopenia, depression, anxiety, and ADD, who presents for follow-up consultation of her tremor and balance issues.  She has a longstanding  history of hand tremors, likely essential tremor, she also has evidence of intermittent head and voice tremor today.  No evidence of parkinsonism.  She had tried propranolol in the past which did not help.  She reports trying it again but it still did not make a big difference.  She is advised that there is unfortunately not a whole lot of medication options for her.  A beta-blocker and higher dose may not be a good option for her due to her history of depression.  She had a brain MRI in 2021 which did not show any obvious structural cause.  We talked about tremor triggers and alleviating measures at length today, we talked about her nonspecific balance issues as well.  She is advised that certain medications may put her at higher risk for balance issues including her Trileptal, Lamictal, and her lithium.  Lithium can also exacerbate tremors.  She is encouraged to stay better hydrated with water, 6 to 8 cups/day are recommended, 8 ounce size each.  She is furthermore advised that daily alcohol consumption could affect her balance adversely and she is encouraged to reduce her daily alcohol consumption and drink less than 1 serving per day, 4 ounce size.  She is advised to start using her cane for gait safety, we talked about the importance of fall prevention.  At this juncture, she is advised to follow-up with her current providers and work on these lifestyle changes.  She can follow-up in this clinic on an as-needed basis. I answered all her questions today and she was in agreement.  I spent 40 minutes in total face-to-face time and in reviewing records during pre-charting, more than 50% of which was spent in counseling and coordination of care, reviewing test results, reviewing medications and treatment regimen and/or in discussing or reviewing the diagnosis of tremor,  balance problem, the prognosis and treatment options. Pertinent laboratory and imaging test results that were available during this visit with the  patient were reviewed by me and considered in my medical decision making (see chart for details).

## 2022-10-25 DIAGNOSIS — M1711 Unilateral primary osteoarthritis, right knee: Secondary | ICD-10-CM | POA: Diagnosis not present

## 2022-10-26 ENCOUNTER — Other Ambulatory Visit: Payer: Self-pay | Admitting: Psychiatry

## 2022-10-26 DIAGNOSIS — F3341 Major depressive disorder, recurrent, in partial remission: Secondary | ICD-10-CM

## 2022-10-26 DIAGNOSIS — F411 Generalized anxiety disorder: Secondary | ICD-10-CM

## 2022-10-29 DIAGNOSIS — R058 Other specified cough: Secondary | ICD-10-CM | POA: Diagnosis not present

## 2022-10-29 DIAGNOSIS — Z6823 Body mass index (BMI) 23.0-23.9, adult: Secondary | ICD-10-CM | POA: Diagnosis not present

## 2022-11-18 ENCOUNTER — Other Ambulatory Visit: Payer: Self-pay | Admitting: Psychiatry

## 2022-11-18 DIAGNOSIS — F411 Generalized anxiety disorder: Secondary | ICD-10-CM

## 2022-11-18 DIAGNOSIS — F3341 Major depressive disorder, recurrent, in partial remission: Secondary | ICD-10-CM

## 2022-11-18 NOTE — Telephone Encounter (Signed)
Has appt with Dr. Clovis Pu on 3/14

## 2022-11-22 ENCOUNTER — Encounter: Payer: Self-pay | Admitting: Psychiatry

## 2022-11-22 ENCOUNTER — Ambulatory Visit (INDEPENDENT_AMBULATORY_CARE_PROVIDER_SITE_OTHER): Payer: Medicare Other | Admitting: Psychiatry

## 2022-11-22 DIAGNOSIS — F411 Generalized anxiety disorder: Secondary | ICD-10-CM | POA: Diagnosis not present

## 2022-11-22 DIAGNOSIS — F900 Attention-deficit hyperactivity disorder, predominantly inattentive type: Secondary | ICD-10-CM | POA: Diagnosis not present

## 2022-11-22 DIAGNOSIS — F3341 Major depressive disorder, recurrent, in partial remission: Secondary | ICD-10-CM | POA: Diagnosis not present

## 2022-11-22 DIAGNOSIS — Z79899 Other long term (current) drug therapy: Secondary | ICD-10-CM | POA: Diagnosis not present

## 2022-11-22 DIAGNOSIS — R7989 Other specified abnormal findings of blood chemistry: Secondary | ICD-10-CM

## 2022-11-22 DIAGNOSIS — F428 Other obsessive-compulsive disorder: Secondary | ICD-10-CM

## 2022-11-22 DIAGNOSIS — G250 Essential tremor: Secondary | ICD-10-CM | POA: Diagnosis not present

## 2022-11-22 DIAGNOSIS — G2401 Drug induced subacute dyskinesia: Secondary | ICD-10-CM | POA: Diagnosis not present

## 2022-11-22 MED ORDER — METHYLPHENIDATE HCL 20 MG PO TABS: 20.0000 mg | ORAL_TABLET | Freq: Three times a day (TID) | ORAL | 0 refills | Status: DC

## 2022-11-22 NOTE — Progress Notes (Signed)
Katrina Gamble [id number] 11-29-1940 82 y.o.     Subjective:   Patient ID:  Katrina Gamble is a 82 y.o. (DOB December 01, 1940) female.  Chief Complaint:  Chief Complaint  Patient presents with   Follow-up   Depression   Anxiety   ADD    Depression        Associated symptoms include no decreased concentration, no fatigue and no suicidal ideas.  Past medical history includes anxiety.   Anxiety Symptoms include nervous/anxious behavior. Patient reports no confusion, decreased concentration, dizziness, nausea, palpitations or suicidal ideas.    Medication Refill Pertinent negatives include no abdominal pain, fatigue or nausea.   Katrina Gamble presents today for follow-up of ADD and mood.  When t seen May 15, 2019.  She continued to have cycles of depression without clear precipitant.  It was suggested that she increase lithium to 450 mg daily check a lithium level.  seen July 15, 2019.  She was having more depression.  The following changes were made. Rec increase lithium to 600 mg daily for a week and check blood level. If this fails to help or is intolerable then recommend lamotrigine trial as noted. She continued Ritalin 20 mg 3 times daily per usual  No lithium level nor BMP were received.  seen August 20 2019.  She was still dealing with depression and had not seen any improvement in depression at 600 mg versus 450 mg daily.  She did not get a blood level. Reduce lithium to 450 mg daily DT NR at 600 daily  recommend lamotrigine trial as noted.  She called back December 30 stating she had been on lamotrigine for 2 weeks and was noticing a tremor and wondered if it was related.  BC of tremor she went down again from 450 mg in lithium and had reduced it to 300 mg instead of 450 mg.  seen October 15, 2019.  The following was noted: CO  Worsening tremor intentional affecting mouse use about December 30.  Wonders about essential tremor vs TD.   Thyroid  checked a couple months ago and dose was not changed.  Tremor in head interfered with dental procedures some.  Alcohol does not help tremor and  Never has.   Essential tremor dx decades ago. Mood is better with lamotrigine 100 mg daily.  Mood more level.   Plan: No meds were changed.  She continued the following: Continue lamotrigine 100 mg daily as it was helpful. Continue Ritalin as prescribed. Continue lithium 300 mg nightly.  So far her mood is been stable with a lower dose Not using much propranolol.  01/12/2020 appointment, the following is noted: Questions about the propranolol.  Still has sig tremor that varies in intensity from mild to severe.  Not always bothered by it unless doing things with fine motor control like writing.  Has taken up to 20 mg propranolol and not more bc ? GI SE.   Mood still good.  Dep controlled.  Anxiety hard to tell bc D with recurrent breast CA. Plan no meds changed.  04/13/2020 appointment with the following noted: Propranolol with limited effect on tremor even up to 50 mg daily.  Hard time writing. Worse with guilty, anxious depressed thoughts.  Neg self talk.  Not sure if anxiety triggered neg thinking.  No change stress or meds.  Sleep is disjointed.  Plan: Increase lamotrigine to 150 mg daily as it was helpful for several months at 100. Continue Ritalin as prescribed. Continue lithium  300 mg nightly.   05/25/2020 appointment with the following noted: After increased lamotrigine to mood overall has been very good with mood.  Bad part is tremor has gotten much worse with intention.  Wonders about neurologist. No med changes  08/24/2020 appointment with the following noted:  Disc lithium and kidney function and questions about CKD 3. Missed neuro appt bc got sick right before it.  RS for FEB.  But opening for tomorrow.  Tremor is increasing . Insomnia is worse.  Trouble going to sleep for a couple of hours.  3 nights per week. Racing thoughts all over  the place.  Some are regretful thoughts from the past but random things.  Don't have the thoughts in the day. No naps. Cut caffeine max 2 cups coffee as late as 11 AM. Ritalin as late as 630 pm and usually that won't bother sleep.  Kids think her ADD meds aren't working well.  Scattered and hard to stay on task.   Not hyper spending or otherwise hyper.  Always tendency to talk if someone to listen. Can internally feel tense.  Not irritable. Doesn't want more meds. Pretty good with depression. Plan: The increase lamotrigine to 150 mg daily resolved the depression.  it was helpful for several months at 100. Continue Ritalin as prescribed 3/4 tablet QID.  Prefers Malincroft generic. She got different brand Accord of methylphenidate and it shatters when tries to quarter it. Continue lithium 300 mg nightly.  Agree with her plans to see neurology.  11/22/2020 appointment with following noted: Neurology diagnosed essential tremor with possible exacerbation by lithium and anxiety and suggested at the moment to withhold treatment for fear of worsening her balance and because the tremor was rather mild on exam.  MRI showed age-appropriate moderate chronic white matter disease. Pt doesn't think her tremor is mild as did neuro.  Worse with writing.  Disc neuro consult.  Disc propranolol dosing and risk of depression at higher dose.  Tremor noticeable in AM before coffee and affects writing and typing. Depression is under control.  Anxiety a little worse and experienced physically.  If get anxious then it builds on itself. Denies appetite disturbance.  Patient reports that energy and motivation have been good.  Patient denies any difficulty with concentration. Patient denies any suicidal ideation. Plan: Retry propranolol per her request 40 mg twice daily for a few days and if no benefit then increase to 50 mg twice daily for a few days. Can increase every few days by 10 mg to a max of 80 mg twice daily as long  as there are no side effects and you monitor blood pressure and pulse without significant changes or problems. The increase lamotrigine to 150 mg daily resolved the depression.  it was helpful for several months at 100. Continue Ritalin as prescribed 3/4 tablet QID.  Prefers Malincroft generic. She got different brand Accord of methylphenidate and it shatters when tries to quarter it. Continue lithium 300 mg nightly.   01/25/2021 appointment with the following noted: Difficult time adjusting to and fearful of changing meds.  Had number of stressors.   Since here # stressors.  GS rehab alcohol and bipolar disorder.   Recognizes pattern of avoidance in her life.  Plans to still try the propranolol bc the tremor is better. Patient reports stable mood and denies depressed or irritable moods.   Patient denies difficulty with sleep initiation or maintenance. Denies appetite disturbance.  Patient reports that energy and motivation have been good.  Patient denies any difficulty with concentration.  Patient denies any suicidal ideation.  Consistent with meds. Pllan: Retry propranolol when she feel ready per her request 40 mg twice daily for a few days and if no benefit then increase to 50 mg twice daily for a few days. Can increase every few days by 10 mg to a max of 80 mg twice daily as long as there are no side effects and you monitor blood pressure and pulse without significant changes or problems. It can help both tremor and anxiety. The increase lamotrigine to 150 mg daily resolved the depression.  it was helpful for several months at 100. Continue Ritalin as prescribed 3/4 tablet QID.  Prefers Malincroft generic. She got different brand Accord of methylphenidate and it shatters when tries to quarter it. Continue lithium 300 mg nightly.   05/11/2021 appointment with the following noted: Continues lamotrigine 150 mg daily, lithium 300 mg daily, Ritalin 20 mg 3 times daily.  Has not been taking  propranolol which was prescribed last visit for tremor and anxiety.  She never tried it.  She didn't feel it was bad enough to warrant treatment.  Tremor affects use of mouse.  Affects writing.   Has recurrence of obsessive thinking and trouble sleeping for a couple of mos.  Obs thoughts on things she's done or not done and that she's responsible for something bad that might happen..  Obs interfere with sleep and are worse.  Last night after 330 before asleep and awake 730.  Never nap and not really tired in the daytime.   Periods of several days in a row of Energizer bunny and then resolves. Plan: Start Trileptal 150 mg HS for 5 days then 300 mg HS.  If not better in a couple of weeks call.  07/10/2021 appointment with the following noted: Disc BP this am.  Not checking it at home.  Is having some back and leg pain today which might increase BP. Really helped anxiety with Trileptal 300 mg nightly.  Choose to go to sleep late.   Avg 4-7 hours sleep. Patient reports stable mood and denies depressed or irritable moods.  Patient denies any recent difficulty with anxiety.  Patient denies difficulty with sleep initiation or maintenance. Denies appetite disturbance.  Patient reports that energy and motivation have been good.  Patient denies any difficulty with concentration.  Patient denies any suicidal ideation. SE always kind of clutsy but may be a little more.  No balance issues in the day. Plan: The increase lamotrigine to 150 mg daily resolved the depression.  it was helpful for several months at 100. Continue Trileptal 300 mg nightly Continue lithium 300 mg nightly Continue Ritalin 20 mg tablets 3/4 to 1 tablet 3 times daily  10/09/2021 appointment with the following noted: 3 issues.  Main concerns are cognitive and tremor. 1 is cognitive seems atypical like with numbers like figuring years.  Computation.   More trouble folding fitted sheets.  Thinks it is worse over 3-6 mos.   No sig trouble paying  bills or doing checking account.  Pays online Has to reread recipes over longer period of time. Not as motivated for self care and getting out of the house.  Not markedly depressed otherwise. Anxiety is still better with Trileptal, but will tend to worry over somatic concerns and think the worst. Sleep is pretty good and sleeping later than usual. Trmor is worse.  Neuro was somewhat dismissive of tremor.  Remote dx of essential tremor Lithium  is successful for mood. Plan; no med changes except reduced lithium to 150 mg daily to see if tremor would be better.  Continue lamotrigine 150, Ritalin 20 mg tablets 3/4 tablets 3 times daily, oxcarbazepine 300 mg nightly,   12/07/21 appt noted: Tremor no better with less lithium. More mood cycling from depression to highs with energizer bunny to anxiety with sense of doom.   Periods of flashing or intrusive thoughts she had done something wrong to new great grand child. Still prefers Malincroft generic Ritalin. Plan: She wanted to try lower dose lithium 150 mg daily and this did not help tremor and mood cycling is worse, so increase lithium bak to300 mg daily.  03/05/2022 appointment with the following noted: Continues lamotrigine 150 mg daily, lithium 300 mg daily, oxcarbazepine 300 mg nightly, Ritalin 20 mg 3 times daily Last still having obsessive intrusive thoughts without reason.  She thinks it's related  to meds but reviewed med sequencing of changes do not provide an obvious med pcpt.   Obsessions primarily about doing something wrong that may harm others.  Can cause sleeplessness and panic feelings. Having insomnia initially with anxious thoughts.    No depression periods.  No hyperactive periods. Tremor seems worse affecting  typing. No PCP now.   Plan: increase oxcarb 450 HS  05/24/2022 appointment noted: Continues lamotrigine 150 mg daily, lithium 300 mg nightly, Trileptal 450 mg nightly, Ritalin 20 mg 3 times daily. Better with increase  oxcarbazepine 450 mg HS with anxiety and insomnia. Last couple of weeks exceptionally organized and motivated.   Sleep is good but tendency to stay up late.  No racing thoughts at night.  No reckless impulsive behavior. No depression. Anxiety is better with oxcarb No sig SE. To [place] to visit lifelong friend next week. Plan no med changes  08/23/22 appt noted: No med changes: lamotrigine 150 mg daily, lithium 300 mg nightly, Trileptal 450 mg nightly, Ritalin 20 mg 3 times daily. Doing OK.  Overall fairly stable.  Holiday mode.  Indecisiveness and procrastinates at this time of year.   She has 25 people at her house at Christmas.   Sleep is good overall without further insomnia.  No mania.   Depression is under control.  Usually has it in November but not this year.   No sig SE. PCP referred her to Dr. Carles Collet at her request and they wouldn't accept her bc already seen another neuro.  Tremor has gotten worse with more trouble using the mouse.  She is aware of essential tremor weighted glove and gets the newsletter.  Functional.  11/22/22 appt noted: No med changes.   lamotrigine 150 mg daily, lithium 300 mg nightly, Trileptal 450 mg nightly, Ritalin 20 mg 3 times daily. Not as good since here.  Trouble organizing for her taxes ? ADD vs cognitive issues.  Loses things not new but more so.  Not getting lost driving.  Not leaving pots on stove.   Sometimes won't leave house for days at a time.  More than normal for her. ? Depression.  More trouble with motivation at home except periods of 3-4  days of mood swing into hypomania and will do things for a couple of days. Had FU neuro.    Holter monitor occ SVT and rare VT but not requiring treatment  H died 4 years ago  Youngest D relapse breast CA.  Stressful.  New great grand D [place].  Psych med history:  Abilify no response,  Mellaril,  Refuses  Seroquel bc F problems with it.  Nardil hyper and goofy, Prozac with side effects, try cyclic  antidepressants, Viibryd, Trintellix caused anxiety,  Wellbutrin which caused tremors and goofy which Lexapro toned down,  imipramine, Pristiq 100,  Vyvanse, Ritalin, history of Dexedrine during pregnancy which caused weight gain,  Cerefolin NAC,   lithium 600 tremor worse (started lithium 150 mg Nov 2018) We tried an increase lithium to 600 mg daily and check a blood level.   The increase in lithium did not help her mood and her made her tremor worse.  Lamotrigine 150 Trileptal 300 helped anxiety Propranolol 50 without help for tremor  Remote hosp for obs on dangerousness irrationally about water in a vase. and panic at [facility] and was on Nardil at the time. F took Seroquel for hallucinations with PD and associated dementia and SE  Review of Systems:  Review of Systems  Constitutional:  Negative for fatigue.  Cardiovascular:  Negative for palpitations.  Gastrointestinal:  Negative for abdominal pain and nausea.  Neurological:  Positive for tremors. Negative for dizziness.       Tremor worse at lithium 600 mg daily versus lithium 450 mg daily  Psychiatric/Behavioral:  Positive for sleep disturbance. Negative for agitation, behavioral problems, confusion, decreased concentration, dysphoric mood, hallucinations, self-injury and suicidal ideas. The patient is nervous/anxious. The patient is not hyperactive.     Medications: I have reviewed the patient's current medications.  Current Outpatient Medications  Medication Sig Dispense Refill   Cetirizine HCl (ZYRTEC ALLERGY) 10 MG CAPS 1 capsule     lamoTRIgine (LAMICTAL) 150 MG tablet Take 1 tablet (150 mg total) by mouth daily. 90 tablet 0   levothyroxine (SYNTHROID) 50 MCG tablet Take 50 mcg by mouth daily.     lithium carbonate 300 MG capsule TAKE ONE CAPSULE BY MOUTH DAILY 90 capsule 0   [START ON 12/20/2022] methylphenidate (RITALIN) 20 MG tablet Take 1 tablet (20 mg total) by mouth 3 (three) times daily with meals. 90 tablet  0   OXcarbazepine (TRILEPTAL) 150 MG tablet TAKE 3 TABLETS BY MOUTH EVERY NIGHT AT BEDTIME 90 tablet 1   methylphenidate (RITALIN) 20 MG tablet Take 1 tablet (20 mg total) by mouth 3 (three) times daily with meals. 90 tablet 0   No current facility-administered medications for this visit.    Medication Side Effects: tremor is affecting handwriting and computer worse with 600 mg lithium..  Numbness in toes.  Hands and feet always cold.  Allergies:  Allergies  Allergen Reactions   Codeine Other (See Comments)    Abdominal pain  Other reaction(s): stomach upset   Latex Hives    Other reaction(s): clammy, "feeling horrible"   Septra [Sulfamethoxazole-Trimethoprim] Rash   Sulfa Antibiotics Rash and Other (See Comments)    Other reaction(s): rash    Past Medical History:  Diagnosis Date   ADD (attention deficit disorder)    Basal cell carcinoma    Depression    Diverticulitis    Hypothyroid    d/t lithium   Osteopenia 11/2017   T score -1.1 FRAX 24% / 14%   Peripheral neuropathy    Tremor, essential    Vitamin D deficiency 12/2017    Family History  Problem Relation Age of Onset   Hypertension Mother    Thyroid disease Mother    Breast cancer Mother 35   Other Father        Brain tumor   Parkinson's disease Father    Dementia Father  Pneumonia Father    Leukemia Maternal Grandmother    Stomach cancer Paternal Grandmother    Heart disease Paternal Grandfather    Breast cancer Daughter 87   Thyroid disease Daughter    Cancer Daughter        Melanoma   Tremor Neg Hx     Social History   Socioeconomic History   Marital status: Widowed    Spouse name: Not on file   Number of children: 3   Years of education: Not on file   Highest education level: Bachelor's degree (e.g., BA, AB, BS)  Occupational History    Comment: retired  Tobacco Use   Smoking status: Former    Packs/day: 0.50    Years: 30.00    Additional pack years: 0.00    Total pack years: 15.00     Types: Cigarettes    Quit date: 2012    Years since quitting: 12.2   Smokeless tobacco: Never  Vaping Use   Vaping Use: Never used  Substance and Sexual Activity   Alcohol use: Not Currently    Alcohol/week: 10.0 standard drinks of alcohol    Types: 10 Glasses of wine per week   Drug use: Never   Sexual activity: Not Currently    Birth control/protection: Post-menopausal    Comment: 1st intercourse 82 yo-Fewer than 5 partners  Other Topics Concern   Not on file  Social History Narrative   Lives alone   Caffeine- 1 c coffee   Social Determinants of Health   Financial Resource Strain: Not on file  Food Insecurity: Not on file  Transportation Needs: Not on file  Physical Activity: Not on file  Stress: Not on file  Social Connections: Not on file  Intimate Partner Violence: Not on file    Past Medical History, Surgical history, Social history, and Family history were reviewed and updated as appropriate.   Please see review of systems for further details on the patient's review from today.   Objective:   Physical Exam:  There were no vitals taken for this visit.  Physical Exam Constitutional:      General: She is not in acute distress.    Appearance: She is well-developed and normal weight.  Musculoskeletal:        General: No deformity.  Neurological:     Mental Status: She is alert and oriented to person, place, and time.     Cranial Nerves: No dysarthria.     Motor: Tremor present.     Coordination: Coordination normal.  Psychiatric:        Attention and Perception: Perception normal. She is inattentive. She does not perceive auditory or visual hallucinations.        Mood and Affect: Mood is depressed. Mood is not anxious. Affect is not labile, blunt, angry or tearful.        Speech: Speech normal.        Behavior: Behavior normal. Behavior is cooperative.        Thought Content: Thought content is not paranoid or delusional. Thought content does not include  homicidal or suicidal ideation. Thought content does not include suicidal plan.        Cognition and Memory: Cognition and memory normal.        Judgment: Judgment normal.     Comments: Good insight Probably mildly depressed with some cog complaints. Lately anxiety and obs thoughts are better   objectively the patient does not show significant mouth movements but she displayed video at  prior visit in which typical mouth movements cheek movements associated with tardive dyskinesia was evident.  Her tremor is rhythmic and is not supportive of a tardive dyskinesia type tremor. Relatively mild facial movements at this time.  Lab Review:     Component Value Date/Time   NA 139 08/25/2020 1410   K 4.3 08/25/2020 1410   CL 103 08/25/2020 1410   CO2 22 08/25/2020 1410   GLUCOSE 85 08/25/2020 1410   BUN 11 08/25/2020 1410   CREATININE 0.93 08/25/2020 1410   CALCIUM 9.7 08/25/2020 1410   PROT 6.7 08/25/2020 1410   ALBUMIN 4.3 08/25/2020 1410   AST 22 08/25/2020 1410   ALT 16 08/25/2020 1410   ALKPHOS 69 08/25/2020 1410   BILITOT 0.5 08/25/2020 1410   GFRNONAA 59 (L) 08/25/2020 1410   GFRAA 69 08/25/2020 1410    No results found for: "WBC", "RBC", "HGB", "HCT", "PLT", "MCV", "MCH", "MCHC", "RDW", "LYMPHSABS", "MONOABS", "EOSABS", "BASOSABS"  No results found for: "POCLITH", "LITHIUM"   No results found for: "PHENYTOIN", "PHENOBARB", "VALPROATE", "CBMZ"   She says lithium level was 0.6 at PCP on 450 mg daily.   07/2020 lithium low therapeutic by her report [facility]  Specifically her lithium level May 17, 2019 on this dosage of lithium was 0.3.  Her urinalysis was normal she had a normal CBC, comprehensive metabolic panel was normal including creatinine 0.83 calcium 9.9 and TSH was normal at 2.83.  Lithium level in April 2019 was 0.2 at this dosage.  We will not increase the lithium because she is currently having tremor at the low dose.  .res Assessment: Plan:    Depression, major,  recurrent, in partial remission ([place]) - Plan: methylphenidate (RITALIN) 20 MG tablet  Attention deficit hyperactivity disorder (ADHD), predominantly inattentive type - Plan: methylphenidate (RITALIN) 20 MG tablet, methylphenidate (RITALIN) 20 MG tablet  Generalized anxiety disorder  Tremor, essential  Obsessional thoughts of harming others  Tardive dyskinesia  Low serum vitamin D  Lithium use   .Greater than 50% of 30-minute face to face time with patient was spent on counseling and coordination of care. We discussed Kaelani has a long history over decades of cycling major depression and treatment resistance to meds noted above of nearly every antidepressant category.  Was more anxious and obsessive with mixed type sx until increase oxcarbazepine 450 mg HS.  Pt has had what she describes as mild hypomanic episodes in the past in response to antidepressants primarily.  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  Not been depressed lately with increase lamotrigine and less anxiety with Trileptal. Mood cycling worse with lithium reduction from 300 to 150 mg and better with 300 mg. While she does not identify herself is bipolar because her upswings seem only normal to her.  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 Usually November worse month of year.    Overall is much more stable with current med regimen than she has been in the past.  So no changes indicated.  Obsessions she made a mistake which will cause harm to others are better.  Lowest risk mood stabilizing med should be Trileptal.  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  Benefit Trileptal for anxiety and somewhat for  sleep even better with increase. Continue Trileptal to 450  mg HS for anxiety and insomnia.   Her best response so far has been from low-dose lithium with a stimulant but lamotrigine has seemed to help for 6 mos in 2020 until relapse in Summer.  The depression resolved again with the increase in lamotrigine from 100 to 150 mg daily.  They do not follow any pattern and do not appear to have any precipitant.   Cognitive complaints but not substantially impairing at this time.  She doesn't want cognitive enhancing meds.  Cerefolin NAC for a month with unclear effects. Rec trial Disc the off-label use of N-Acetylcysteine at 600 mg daily to help with mild cognitive problems.  It can be combined with a B-complex vitamin as the B-12 and folate have been shown to sometimes enhance the effect.  Gave her samples in the past. Option Namenda.  She'd rather not add more meds.  We tried an increase lithium to 600 mg daily and check a blood level.   The increase in lithium did not help her mood and her made her tremor worse. Counseled patient regarding potential benefits, risks, and side effects of lithium to include potential risk of lithium affecting thyroid and renal function.  Discussed need for periodic lab monitoring to determine drug level and to assess for potential adverse effects.  Counseled patient regarding signs and symptoms of lithium toxicity and advised that they notify office immediately or seek urgent medical attention if experiencing these signs and symptoms.  Patient advised to contact office with any questions or concerns.    The tremor predates lithium and predates stimulant use but appears to have been made worse by the increase in lamotrigine.  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  She saw a neurologist. Does not really want to add meds for tremor.  the increase lamotrigine to 150 mg daily resolved the depression.  it was helpful for several months at 100. Consider reducing  lamotrigine to see if obs thoughts get better.  Consider increasing for mood.  Consider primidone for tremor.  On lithium 300 mg nightly.  It appears she cannot tolerate a higher dose.  Consider reduction to reduce tremor possibly given improvement from lamotrigine. She wanted to try lower dose lithium 150 mg daily and this did not help tremor and mood cycling is worse, so increase lithium back to 300 mg daily.  Discussed potential benefits, risks, and side effects of stimulants with patient to include increased heart rate, palpitations, insomnia, increased anxiety, increased irritability, or decreased appetite.  Instructed patient to contact office if experiencing any significant tolerability issues. Pleased she got the [place] generic Ritalin bc easier to cut. Continue Ritalin as prescribed 3/4 tablet QID.  Prefers [place] generic. She got different brand [place] of methylphenidate and it shatters when tries to quarter it.  Continue  Vitamin D. Disc reasons for doing so for mental and physical health in detail.  She has stopped bc she hates taking pills.  Supportive therapy dealing with GS's mental health problems.  She is changing pharmacies and insurance with January  No med changes: lamotrigine 150 mg daily, lithium 300 mg nightly, Trileptal 450 mg nightly, Ritalin 20 mg 3 times daily.  FU  3 mos  Lynder Parents, MD, DFAPA '   Please see After Visit Summary for patient specific instructions.  No future appointments.    No orders of the defined types were placed in  this encounter.      -------------------------------

## 2022-12-03 ENCOUNTER — Other Ambulatory Visit: Payer: Self-pay | Admitting: Psychiatry

## 2022-12-03 DIAGNOSIS — F332 Major depressive disorder, recurrent severe without psychotic features: Secondary | ICD-10-CM

## 2022-12-19 DIAGNOSIS — E039 Hypothyroidism, unspecified: Secondary | ICD-10-CM | POA: Diagnosis not present

## 2022-12-19 DIAGNOSIS — Z79899 Other long term (current) drug therapy: Secondary | ICD-10-CM | POA: Diagnosis not present

## 2022-12-19 DIAGNOSIS — E559 Vitamin D deficiency, unspecified: Secondary | ICD-10-CM | POA: Diagnosis not present

## 2023-01-22 ENCOUNTER — Other Ambulatory Visit: Payer: Self-pay | Admitting: Psychiatry

## 2023-01-22 DIAGNOSIS — F331 Major depressive disorder, recurrent, moderate: Secondary | ICD-10-CM

## 2023-02-01 ENCOUNTER — Telehealth: Payer: Self-pay | Admitting: Psychiatry

## 2023-02-01 ENCOUNTER — Other Ambulatory Visit: Payer: Self-pay | Admitting: Psychiatry

## 2023-02-01 DIAGNOSIS — F900 Attention-deficit hyperactivity disorder, predominantly inattentive type: Secondary | ICD-10-CM

## 2023-02-01 DIAGNOSIS — F3341 Major depressive disorder, recurrent, in partial remission: Secondary | ICD-10-CM

## 2023-02-01 MED ORDER — METHYLPHENIDATE HCL 20 MG PO TABS: 20.0000 mg | ORAL_TABLET | Freq: Three times a day (TID) | ORAL | 0 refills | Status: DC

## 2023-02-01 NOTE — Telephone Encounter (Signed)
Pt lvm that she needs a refill on her ritalin 20 mg. Pharmacy is [facility] on [address]. Next appt 6/17

## 2023-02-25 ENCOUNTER — Encounter: Payer: Self-pay | Admitting: Psychiatry

## 2023-02-25 ENCOUNTER — Ambulatory Visit (INDEPENDENT_AMBULATORY_CARE_PROVIDER_SITE_OTHER): Payer: Medicare Other | Admitting: Psychiatry

## 2023-02-25 DIAGNOSIS — Z79899 Other long term (current) drug therapy: Secondary | ICD-10-CM | POA: Diagnosis not present

## 2023-02-25 DIAGNOSIS — R7989 Other specified abnormal findings of blood chemistry: Secondary | ICD-10-CM

## 2023-02-25 DIAGNOSIS — F332 Major depressive disorder, recurrent severe without psychotic features: Secondary | ICD-10-CM

## 2023-02-25 DIAGNOSIS — F331 Major depressive disorder, recurrent, moderate: Secondary | ICD-10-CM

## 2023-02-25 DIAGNOSIS — F3341 Major depressive disorder, recurrent, in partial remission: Secondary | ICD-10-CM

## 2023-02-25 DIAGNOSIS — F900 Attention-deficit hyperactivity disorder, predominantly inattentive type: Secondary | ICD-10-CM

## 2023-02-25 DIAGNOSIS — F411 Generalized anxiety disorder: Secondary | ICD-10-CM

## 2023-02-25 DIAGNOSIS — G250 Essential tremor: Secondary | ICD-10-CM | POA: Diagnosis not present

## 2023-02-25 MED ORDER — LITHIUM CARBONATE 300 MG PO CAPS: 300.0000 mg | ORAL_CAPSULE | Freq: Every day | ORAL | 1 refills | Status: DC

## 2023-02-25 MED ORDER — METHYLPHENIDATE HCL 20 MG PO TABS: 20.0000 mg | ORAL_TABLET | Freq: Three times a day (TID) | ORAL | 0 refills | Status: DC

## 2023-02-25 MED ORDER — LAMOTRIGINE 150 MG PO TABS: 150.0000 mg | ORAL_TABLET | Freq: Every day | ORAL | 1 refills | Status: DC

## 2023-02-25 MED ORDER — OXCARBAZEPINE 150 MG PO TABS: 450.0000 mg | ORAL_TABLET | Freq: Every day | ORAL | 1 refills | Status: DC

## 2023-02-25 NOTE — Progress Notes (Signed)
Katrina Gamble [id number] 05-30-1941 82 y.o.     Subjective:   Patient ID:  Katrina Gamble is a 82 y.o. (DOB [birth year]) female.  Chief Complaint:  Chief Complaint  Patient presents with   Follow-up   Depression   Anxiety    Depression        Associated symptoms include no decreased concentration, no fatigue and no suicidal ideas.  Past medical history includes anxiety.   Anxiety Symptoms include nervous/anxious behavior. Patient reports no confusion, decreased concentration, dizziness, nausea, palpitations or suicidal ideas.    Medication Refill Pertinent negatives include no fatigue or nausea.   Katrina Gamble presents today (82 y.o.) for follow-up of ADD and mood.  When t seen May 15, 2019.  She continued to have cycles of depression without clear precipitant.  It was suggested that she increase lithium to 450 mg daily check a lithium level.  seen July 15, 2019.  She was having more depression.  The following changes were made. Rec increase lithium to 600 mg daily for a week and check blood level. If this fails to help or is intolerable then recommend lamotrigine trial as noted. She continued Ritalin 20 mg 3 times daily per usual  No lithium level nor BMP were received.  seen August 20 2019.  She was still dealing with depression and had not seen any improvement in depression at 600 mg versus 450 mg daily.  She did not get a blood level. Reduce lithium to 450 mg daily DT NR at 600 daily  recommend lamotrigine trial as noted.  She called back December 30 stating she had been on lamotrigine for 2 weeks and was noticing a tremor and wondered if it was related.  BC of tremor she went down again from 450 mg in lithium and had reduced it to 300 mg instead of 450 mg.  seen October 15, 2019.  The following was noted: CO  Worsening tremor intentional affecting mouse use about December 30.  Wonders about essential tremor vs TD.   Thyroid checked a couple months  ago and dose was not changed.  Tremor in head interfered with dental procedures some.  Alcohol does not help tremor and  Never has.   Essential tremor dx decades ago. Mood is better with lamotrigine 100 mg daily.  Mood more level.   Plan: No meds were changed.  She continued the following: Continue lamotrigine 100 mg daily as it was helpful. Continue Ritalin as prescribed. Continue lithium 300 mg nightly.  So far her mood is been stable with a lower dose Not using much propranolol.  01/12/2020 appointment, the following is noted: Questions about the propranolol.  Still has sig tremor that varies in intensity from mild to severe.  Not always bothered by it unless doing things with fine motor control like writing.  Has taken up to 20 mg propranolol and not more bc ? GI SE.   Mood still good.  Dep controlled.  Anxiety hard to tell bc D with recurrent breast CA. Plan no meds changed.  04/13/2020 appointment with the following noted: Propranolol with limited effect on tremor even up to 50 mg daily.  Hard time writing. Worse with guilty, anxious depressed thoughts.  Neg self talk.  Not sure if anxiety triggered neg thinking.  No change stress or meds.  Sleep is disjointed.  Plan: Increase lamotrigine to 150 mg daily as it was helpful for several months at 100. Continue Ritalin as prescribed. Continue lithium 300 mg nightly.  05/25/2020 appointment with the following noted: After increased lamotrigine to mood overall has been very good with mood.  Bad part is tremor has gotten much worse with intention.  Wonders about neurologist. No med changes  08/24/2020 appointment with the following noted:  Disc lithium and kidney function and questions about CKD 3. Missed neuro appt bc got sick right before it.  RS for FEB.  But opening for tomorrow.  Tremor is increasing . Insomnia is worse.  Trouble going to sleep for a couple of hours.  3 nights per week. Racing thoughts all over the place.  Some are  regretful thoughts from the past but random things.  Don't have the thoughts in the day. No naps. Cut caffeine max 2 cups coffee as late as 11 AM. Ritalin as late as 630 pm and usually that won't bother sleep.  Kids think her ADD meds aren't working well.  Scattered and hard to stay on task.   Not hyper spending or otherwise hyper.  Always tendency to talk if someone to listen. Can internally feel tense.  Not irritable. Doesn't want more meds. Pretty good with depression. Plan: The increase lamotrigine to 150 mg daily resolved the depression.  it was helpful for several months at 100. Continue Ritalin as prescribed 3/4 tablet QID.  Prefers Mal[employer]roft generic. She got different brand Accord of methylphenidate and it shatters when tries to quarter it. Continue lithium 300 mg nightly.  Agree with her plans to see neurology.  11/22/2020 appointment with following noted: Neurology diagnosed essential tremor with possible exacerbation by lithium and anxiety and suggested at the moment to withhold treatment for fear of worsening her balance and because the tremor was rather mild on exam.  MRI showed age-appropriate moderate chronic white matter disease. Pt doesn't think her tremor is mild as did neuro.  Worse with writing.  Disc neuro consult.  Disc propranolol dosing and risk of depression at higher dose.  Tremor noticeable in AM before coffee and affects writing and typing. Depression is under control.  Anxiety a little worse and experienced physically.  If get anxious then it builds on itself. Denies appetite disturbance.  Patient reports that energy and motivation have been good.  Patient denies any difficulty with concentration. Patient denies any suicidal ideation. Plan: Retry propranolol per her request 40 mg twice daily for a few days and if no benefit then increase to 50 mg twice daily for a few days. Can increase every few days by 10 mg to a max of 80 mg twice daily as long as there are no side  effects and you monitor blood pressure and pulse without significant changes or problems. The increase lamotrigine to 150 mg daily resolved the depression.  it was helpful for several months at 100. Continue Ritalin as prescribed 3/4 tablet QID.  Prefers Mal[employer]roft generic. She got different brand Accord of methylphenidate and it shatters when tries to quarter it. Continue lithium 300 mg nightly.   01/25/2021 appointment with the following noted: Difficult time adjusting to and fearful of changing meds.  Had number of stressors.   Since here # stressors.  GS rehab alcohol and bipolar disorder.   Recognizes pattern of avoidance in her life.  Plans to still try the propranolol bc the tremor is better. Patient reports stable mood and denies depressed or irritable moods.   Patient denies difficulty with sleep initiation or maintenance. Denies appetite disturbance.  Patient reports that energy and motivation have been good.  Patient denies any difficulty  with concentration.  Patient denies any suicidal ideation.  Consistent with meds. Pllan: Retry propranolol when she feel ready per her request 40 mg twice daily for a few days and if no benefit then increase to 50 mg twice daily for a few days. Can increase every few days by 10 mg to a max of 80 mg twice daily as long as there are no side effects and you monitor blood pressure and pulse without significant changes or problems. It can help both tremor and anxiety. The increase lamotrigine to 150 mg daily resolved the depression.  it was helpful for several months at 100. Continue Ritalin as prescribed 3/4 tablet QID.  Prefers Malincroft generic. She got different brand Accord of methylphenidate and it shatters when tries to quarter it. Continue lithium 300 mg nightly.   05/11/2021 appointment with the following noted: Continues lamotrigine 150 mg daily, lithium 300 mg daily, Ritalin 20 mg 3 times daily.  Has not been taking propranolol which was  prescribed last visit for tremor and anxiety.  She never tried it.  She didn't feel it was bad enough to warrant treatment.  Tremor affects use of mouse.  Affects writing.   Has recurrence of obsessive thinking and trouble sleeping for a couple of mos.  Obs thoughts on things she's done or not done and that she's responsible for something bad that might happen..  Obs interfere with sleep and are worse.  Last night after 330 before asleep and awake 730.  Never nap and not really tired in the daytime.   Periods of several days in a row of Energizer bunny and then resolves. Plan: Start Trileptal 150 mg HS for 5 days then 300 mg HS.  If not better in a couple of weeks call.  07/10/2021 appointment with the following noted: Disc BP this am.  Not checking it at home.  Is having some back and leg pain today which might increase BP. Really helped anxiety with Trileptal 300 mg nightly.  Choose to go to sleep late.   Avg 4-7 hours sleep. Patient reports stable mood and denies depressed or irritable moods.  Patient denies any recent difficulty with anxiety.  Patient denies difficulty with sleep initiation or maintenance. Denies appetite disturbance.  Patient reports that energy and motivation have been good.  Patient denies any difficulty with concentration.  Patient denies any suicidal ideation. SE always kind of clutsy but may be a little more.  No balance issues in the day. Plan: The increase lamotrigine to 150 mg daily resolved the depression.  it was helpful for several months at 100. Continue Trileptal 300 mg nightly Continue lithium 300 mg nightly Continue Ritalin 20 mg tablets 3/4 to 1 tablet 3 times daily  10/09/2021 appointment with the following noted: 3 issues.  Main concerns are cognitive and tremor. 1 is cognitive seems atypical like with numbers like figuring years.  Computation.   More trouble folding fitted sheets.  Thinks it is worse over 3-6 mos.   No sig trouble paying bills or doing  checking account.  Pays online Has to reread recipes over longer period of time. Not as motivated for self care and getting out of the house.  Not markedly depressed otherwise. Anxiety is still better with Trileptal, but will tend to worry over somatic concerns and think the worst. Sleep is pretty good and sleeping later than usual. Trmor is worse.  Neuro was somewhat dismissive of tremor.  Remote dx of essential tremor Lithium is successful for mood.  Plan; no med changes except reduced lithium to 150 mg daily to see if tremor would be better.  Continue lamotrigine 150, Ritalin 20 mg tablets 3/4 tablets 3 times daily, oxcarbazepine 300 mg nightly,   12/07/21 appt noted: Tremor no better with less lithium. More mood cycling from depression to highs with energizer bunny to anxiety with sense of doom.   Periods of flashing or intrusive thoughts she had done something wrong to new great grand child. Still prefers Mal[employer]ro[employer] generic Ritalin. Plan: She wanted to try lower dose lithium 150 mg daily and this did not help tremor and mood cycling is worse, so increase lithium bak to300 mg daily.  03/05/2022 appointment with the following noted: Continues lamotrigine 150 mg daily, lithium 300 mg daily, oxcarbazepine 300 mg nightly, Ritalin 20 mg 3 times daily Last still having obsessive intrusive thoughts without reason.  She thinks it's related  to meds but reviewed med sequencing of changes do not provide an obvious med pcpt.   Obsessions primarily about doing something wrong that may harm others.  Can cause sleeplessness and panic feelings. Having insomnia initially with anxious thoughts.    No depression periods.  No hyperactive periods. Tremor seems worse affecting  typing. No PCP now.   Plan: increase oxcarb 450 HS  05/24/2022 appointment noted: Continues lamotrigine 150 mg daily, lithium 300 mg nightly, Trileptal 450 mg nightly, Ritalin 20 mg 3 times daily. Better with increase oxcarbazepine 450  mg HS with anxiety and insomnia. Last couple of weeks exceptionally organized and motivated.   Sleep is good but tendency to stay up late.  No racing thoughts at night.  No reckless impulsive behavior. No depression. Anxiety is better with oxcarb No sig SE. To [place] to visit lifelong friend next week. Plan no med changes  08/23/22 appt noted: No med changes: lamotrigine 150 mg daily, lithium 300 mg nightly, Trileptal 450 mg nightly, Ritalin 20 mg 3 times daily. Doing OK.  Overall fairly stable.  Holiday mode.  Indecisiveness and procrastinates at this time of year.   She has 25 people at her house at Christmas.   Sleep is good overall without further insomnia.  No mania.   Depression is under control.  Usually has it in November but not this year.   No sig SE. PCP referred her to Dr. Arbutus Leas at her request and they wouldn't accept her bc already seen another neuro.  Tremor has gotten worse with more trouble using the mouse.  She is aware of essential tremor weighted glove and gets the newsletter.  Functional.  11/22/22 appt noted: No med changes.   lamotrigine 150 mg daily, lithium 300 mg nightly, Trileptal 450 mg nightly, Ritalin 20 mg 3 times daily. Not as good since here.  Trouble organizing for her taxes ? ADD vs cognitive issues.  Loses things not new but more so.  Not getting lost driving.  Not leaving pots on stove.   Sometimes won't leave house for days at a time.  More than normal for her. ? Depression.  More trouble with motivation at home except periods of 3-4  days of mood swing into hypomania and will do things for a couple of days. Had FU neuro.    /02/25/23 note:  No med changes Problems with hourse leak.  Asbestos.  Ongoing repair.  Not great at decision making.   Dep remains manageable.  No problems with meds.   Normal labs with PCP and expected subtherapeutic lithium as expected .  Lithium is  helping at low dose. Still benefit Ritalin.  Notice absence more than when takes  it.  Holter monitor occ SVT and rare VT but not requiring treatment  H died 4 years ago  Youngest D relapse breast CA.  Stressful.  New great grand D [place].  Psych med history:  Abilify no response,  Mellaril,  Refuses Seroquel bc F problems with it.  Nardil hyper and goofy, Prozac with side effects, try cyclic antidepressants, Viibryd, Trintellix caused anxiety,  Wellbutrin which caused tremors and goofy which Lexapro toned down,  imipramine, Pristiq 100,  Vyvanse, Ritalin, history of Dexedrine during pregnancy which caused weight gain,  Cerefolin NAC,   lithium 600 tremor worse (started lithium 150 mg Nov 2018) We tried an increase lithium to 600 mg daily and check a blood level.   The increase in lithium did not help her mood and her made her tremor worse.  Lamotrigine 150 Trileptal 300 helped anxiety Propranolol 50 without help for tremor  Remote hosp for obs on dangerousness irrationally about water in a vase. and panic at [facility] and was on Nardil at the time. F took Seroquel for hallucinations with PD and associated dementia and SE  Review of Systems:  Review of Systems  Constitutional:  Negative for fatigue.  Cardiovascular:  Negative for palpitations.  Gastrointestinal:  Negative for nausea.  Neurological:  Positive for tremors. Negative for dizziness.       Tremor worse at lithium 600 mg daily versus lithium 450 mg daily  Psychiatric/Behavioral:  Positive for sleep disturbance. Negative for agitation, behavioral problems, confusion, decreased concentration, dysphoric mood, hallucinations, self-injury and suicidal ideas. The patient is nervous/anxious. The patient is not hyperactive.     Medications: I have reviewed the patient's current medications.  Current Outpatient Medications  Medication Sig Dispense Refill   Cetirizine HCl (ZYRTEC ALLERGY) 10 MG CAPS 1 capsule     levothyroxine (SYNTHROID) 50 MCG tablet Take 50 mcg by mouth daily.     lamoTRIgine  (LAMICTAL) 150 MG tablet Take 1 tablet (150 mg total) by mouth daily. 90 tablet 1   lithium carbonate 300 MG capsule Take 1 capsule (300 mg total) by mouth daily. 90 capsule 1   [START ON 03/01/2023] methylphenidate (RITALIN) 20 MG tablet Take 1 tablet (20 mg total) by mouth 3 (three) times daily with meals. 90 tablet 0   [START ON 04/22/2023] methylphenidate (RITALIN) 20 MG tablet Take 1 tablet (20 mg total) by mouth 3 (three) times daily with meals. 90 tablet 0   OXcarbazepine (TRILEPTAL) 150 MG tablet Take 3 tablets (450 mg total) by mouth at bedtime. 270 tablet 1   No current facility-administered medications for this visit.    Medication Side Effects: tremor is affecting handwriting and computer worse with 600 mg lithium..  Numbness in toes.  Hands and feet always cold.  Allergies:  Allergies  Allergen Reactions   Codeine Other (See Comments)    Abdominal pain  Other reaction(s): stomach upset   Latex Hives    Other reaction(s): clammy, "feeling horrible"   Septra [Sulfamethoxazole-Trimethoprim] Rash   Sulfa Antibiotics Rash and Other (See Comments)    Other reaction(s): rash    Past Medical History:  Diagnosis Date   ADD (attention deficit disorder)    Basal cell carcinoma    Depression    Diverticulitis    Hypothyroid    d/t lithium   Osteopenia 11/2017   T score -1.1 FRAX 24% / 14%   Peripheral neuropathy  Tremor, essential    Vitamin D deficiency 12/2017    Family History  Problem Relation Age of Onset   Hypertension Mother    Thyroid disease Mother    Breast cancer Mother 25   Other Father        Brain tumor   Parkinson's disease Father    Dementia Father    Pneumonia Father    Leukemia Maternal Grandmother    Stomach cancer Paternal Grandmother    Heart disease Paternal Grandfather    Breast cancer Daughter 56   Thyroid disease Daughter    Cancer Daughter        Melanoma   Tremor Neg Hx     Social History   Socioeconomic History   Marital  status: Widowed    Spouse name: Not on file   Number of children: 3   Years of education: Not on file   Highest education level: Bachelor's degree (e.g., BA, AB, BS)  Occupational History    Comment: retired  Tobacco Use   Smoking status: Former    Packs/day: 0.50    Years: 30.00    Additional pack years: 0.00    Total pack years: 15.00    Types: Cigarettes    Quit date: 2012    Years since quitting: 12.4   Smokeless tobacco: Never  Vaping Use   Vaping Use: Never used  Substance and Sexual Activity   Alcohol use: Not Currently    Alcohol/week: 10.0 standard drinks of alcohol    Types: 10 Glasses of wine per week   Drug use: Never   Sexual activity: Not Currently    Birth control/protection: Post-menopausal    Comment: 1st intercourse 82 yo-Fewer than 5 partners  Other Topics Concern   Not on file  Social History Narrative   Lives alone   Caffeine- 1 c coffee   Social Determinants of Health   Financial Resource Strain: Not on file  Food Insecurity: Not on file  Transportation Needs: Not on file  Physical Activity: Not on file  Stress: Not on file  Social Connections: Not on file  Intimate Partner Violence: Not on file    Past Medical History, Surgical history, Social history, and Family history were reviewed and updated as appropriate.   Please see review of systems for further details on the patient's review from today.   Objective:   Physical Exam:  There were no vitals taken for this visit.  Physical Exam Constitutional:      General: She is not in acute distress.    Appearance: She is well-developed and normal weight.  Musculoskeletal:        General: No deformity.  Neurological:     Mental Status: She is alert and oriented to person, place, and time.     Cranial Nerves: No dysarthria.     Motor: Tremor present.     Coordination: Coordination normal.  Psychiatric:        Attention and Perception: Perception normal. She is inattentive. She does not  perceive auditory or visual hallucinations.        Mood and Affect: Mood is not anxious or depressed. Affect is not labile, blunt, angry or tearful.        Speech: Speech normal.        Behavior: Behavior normal. Behavior is cooperative.        Thought Content: Thought content is not paranoid or delusional. Thought content does not include homicidal or suicidal ideation. Thought content does not include suicidal  plan.        Cognition and Memory: Cognition and memory normal.        Judgment: Judgment normal.     Comments: Good insight Dep seems managed Lately anxiety and obs thoughts are better   objectively the patient does not show significant mouth movements but she displayed video at prior visit in which typical mouth movements cheek movements associated with tardive dyskinesia was evident.  Her tremor is rhythmic and is not supportive of a tardive dyskinesia type tremor. Relatively mild facial movements at this time.  Lab Review:     Component Value Date/Time   NA 139 08/25/2020 1410   K 4.3 08/25/2020 1410   CL 103 08/25/2020 1410   CO2 22 08/25/2020 1410   GLUCOSE 85 08/25/2020 1410   BUN 11 08/25/2020 1410   CREATININE 0.93 08/25/2020 1410   CALCIUM 9.7 08/25/2020 1410   PROT 6.7 08/25/2020 1410   ALBUMIN 4.3 08/25/2020 1410   AST 22 08/25/2020 1410   ALT 16 08/25/2020 1410   ALKPHOS 69 08/25/2020 1410   BILITOT 0.5 08/25/2020 1410   GFRNONAA 59 (L) 08/25/2020 1410   GFRAA 69 08/25/2020 1410    No results found for: "WBC", "RBC", "HGB", "HCT", "PLT", "MCV", "MCH", "MCHC", "RDW", "LYMPHSABS", "MONOABS", "EOSABS", "BASOSABS"  No results found for: "POCLITH", "LITHIUM"   No results found for: "PHENYTOIN", "PHENOBARB", "VALPROATE", "CBMZ"   She says lithium level was 0.6 at PCP on 450 mg daily.   07/2020 lithium low therapeutic by her report [facility]  Specifically her lithium level May 17, 2019 on this dosage of lithium was 0.3.  Her urinalysis was normal she had a  normal CBC, comprehensive metabolic panel was normal including creatinine 0.83 calcium 9.9 and TSH was normal at 2.83.  Lithium level in April 2019 was 0.2 at this dosage.  We will not increase the lithium because she is currently having tremor at the low dose.  .res Assessment: Plan:    Depression, major, recurrent, in partial remission (HCC) - Plan: methylphenidate (RITALIN) 20 MG tablet, OXcarbazepine (TRILEPTAL) 150 MG tablet  Generalized anxiety disorder - Plan: OXcarbazepine (TRILEPTAL) 150 MG tablet  Attention deficit hyperactivity disorder (ADHD), predominantly inattentive type - Plan: methylphenidate (RITALIN) 20 MG tablet, methylphenidate (RITALIN) 20 MG tablet  Tremor, essential  Low serum vitamin D  Lithium use  Severe episode of recurrent major depressive disorder, without psychotic features (HCC) - Plan: lamoTRIgine (LAMICTAL) 150 MG tablet  Major depressive disorder, recurrent episode, moderate (HCC) - Plan: lithium carbonate 300 MG capsule   30-minute face to face time with patient was spent on counseling and coordination of care. We discussed Alixandria has a long history over decades of cycling major depression and treatment resistance to meds noted above of nearly every antidepressant category.  Was more anxious and obsessive with mixed type sx until increase oxcarbazepine 450 mg HS.  Pt has had what she describes as mild hypomanic episodes in the past in response to antidepressants primarily.  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  Not been depressed lately with increase lamotrigine and less anxiety with Trileptal. Mood cycling worse with lithium reduction from 300 to 150 mg and better with 300 mg. While she does not identify herself is bipolar because her upswings seem only normal to her.  The cycling nature of her depression is more consistent with a bipolar type depression than it is with major  depression  which does not typically occur with intermittent periods of several days of feeling euthymic and then back into depression again which is her pattern. Usually November worse month of year.    Overall is much more stable with current med regimen than she has been in the past.  So no changes indicated.  Obsessions she made a mistake which will cause harm to others are better.  Lowest risk mood stabilizing med should be Trileptal.  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   Benefit Trileptal for anxiety and somewhat for sleep even better with increase. Continue Trileptal to 450  mg HS for anxiety and insomnia.   Her best response so far has been from low-dose lithium with a stimulant but lamotrigine has seemed to help for 6 mos in 2020 until relapse in Summer.  The depression resolved again with the increase in lamotrigine from 100 to 150 mg daily.  They do not follow any pattern and do not appear to have any precipitant.   Cognitive complaints but not substantially impairing at this time.  She doesn't want cognitive enhancing meds.  Cerefolin NAC for a month with unclear effects. Rec trial Disc the off-label use of N-Acetylcysteine at 600 mg daily to help with mild cognitive problems.  It can be combined with a B-complex vitamin as the B-12 and folate have been shown to sometimes enhance the effect.  Gave her samples in the past. Option Namenda.  She'd rather not add more meds.  We tried an increase lithium to 600 mg daily and check a blood level.   The increase in lithium did not help her mood and her made her tremor worse. Counseled patient regarding potential benefits, risks, and side effects of lithium to include potential risk of lithium affecting thyroid and renal function.  Discussed need for periodic lab monitoring to determine drug level and to assess for potential adverse effects.  Counseled patient regarding signs and symptoms of lithium toxicity and  advised that they notify office immediately or seek urgent medical attention if experiencing these signs and symptoms.  Patient advised to contact office with any questions or concerns.    The tremor predates lithium and predates stimulant use but appears to have been made worse by the increase in lamotrigine.  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  She saw a neurologist. Does not really want to add meds for tremor.  the increase lamotrigine to 150 mg daily resolved the depression.  it was helpful for several months at 100. Consider reducing lamotrigine to see if obs thoughts get better.  Consider increasing for mood.  Consider primidone for tremor.  On lithium 300 mg nightly.  It appears she cannot tolerate a higher dose.  Consider reduction to reduce tremor possibly given improvement from lamotrigine. She wanted to try lower dose lithium 150 mg daily and this did not help tremor and mood cycling is worse, so increase lithium back to 300 mg daily.  Discussed potential benefits, risks, and side effects of stimulants with patient to include increased heart rate, palpitations, insomnia, increased anxiety, increased irritability, or decreased appetite.  Instructed patient to contact office if experiencing any significant tolerability issues. Pleased she got the [place] generic Ritalin bc easier to cut. Continue Ritalin as prescribed 3/4 tablet QID.  Prefers [place] generic. She got different brand [place] of methylphenidate and it shatters when tries to quarter it.  Continue  Vitamin D. Disc reasons for doing so for mental and  physical health in detail.  She has stopped bc she hates taking pills.  Supportive therapy dealing with GS's mental health problems.  Labs per PCP [facility].  No problems  No med changes: lamotrigine 150 mg daily, lithium 300 mg nightly, Trileptal 450 mg nightly, Ritalin 20 mg 3 times daily.  FU  3 mos  Meredith Staggers, MD, DFAPA '   Please see After  Visit Summary for patient specific instructions.  No future appointments.    No orders of the defined types were placed in this encounter.      -------------------------------

## 2023-03-12 DIAGNOSIS — H26493 Other secondary cataract, bilateral: Secondary | ICD-10-CM | POA: Diagnosis not present

## 2023-04-03 DIAGNOSIS — Z6822 Body mass index (BMI) 22.0-22.9, adult: Secondary | ICD-10-CM | POA: Diagnosis not present

## 2023-04-03 DIAGNOSIS — M7632 Iliotibial band syndrome, left leg: Secondary | ICD-10-CM | POA: Diagnosis not present

## 2023-04-16 DIAGNOSIS — H18413 Arcus senilis, bilateral: Secondary | ICD-10-CM | POA: Diagnosis not present

## 2023-04-16 DIAGNOSIS — H26491 Other secondary cataract, right eye: Secondary | ICD-10-CM | POA: Diagnosis not present

## 2023-04-16 DIAGNOSIS — H18593 Other hereditary corneal dystrophies, bilateral: Secondary | ICD-10-CM | POA: Diagnosis not present

## 2023-04-16 DIAGNOSIS — H26493 Other secondary cataract, bilateral: Secondary | ICD-10-CM | POA: Diagnosis not present

## 2023-04-16 DIAGNOSIS — Z961 Presence of intraocular lens: Secondary | ICD-10-CM | POA: Diagnosis not present

## 2023-04-18 DIAGNOSIS — M25562 Pain in left knee: Secondary | ICD-10-CM | POA: Diagnosis not present

## 2023-04-22 DIAGNOSIS — M25562 Pain in left knee: Secondary | ICD-10-CM | POA: Diagnosis not present

## 2023-04-24 DIAGNOSIS — M25562 Pain in left knee: Secondary | ICD-10-CM | POA: Diagnosis not present

## 2023-04-30 DIAGNOSIS — E559 Vitamin D deficiency, unspecified: Secondary | ICD-10-CM | POA: Diagnosis not present

## 2023-04-30 DIAGNOSIS — E039 Hypothyroidism, unspecified: Secondary | ICD-10-CM | POA: Diagnosis not present

## 2023-04-30 DIAGNOSIS — M25562 Pain in left knee: Secondary | ICD-10-CM | POA: Diagnosis not present

## 2023-04-30 DIAGNOSIS — Z79899 Other long term (current) drug therapy: Secondary | ICD-10-CM | POA: Diagnosis not present

## 2023-05-01 ENCOUNTER — Other Ambulatory Visit: Payer: Self-pay | Admitting: Family Medicine

## 2023-05-01 ENCOUNTER — Ambulatory Visit: Admission: RE | Admit: 2023-05-01 | Payer: Medicare Other | Source: Ambulatory Visit

## 2023-05-01 DIAGNOSIS — M25562 Pain in left knee: Secondary | ICD-10-CM

## 2023-05-03 DIAGNOSIS — M25562 Pain in left knee: Secondary | ICD-10-CM | POA: Diagnosis not present

## 2023-05-07 DIAGNOSIS — M25562 Pain in left knee: Secondary | ICD-10-CM | POA: Diagnosis not present

## 2023-05-07 NOTE — Progress Notes (Signed)
Lithium level 0.4 on 300 HS on 300 mg HS with benefit.  No changes

## 2023-05-08 ENCOUNTER — Other Ambulatory Visit: Payer: Self-pay | Admitting: Family Medicine

## 2023-05-08 DIAGNOSIS — M8560 Other cyst of bone, unspecified site: Secondary | ICD-10-CM

## 2023-05-14 DIAGNOSIS — M25562 Pain in left knee: Secondary | ICD-10-CM | POA: Diagnosis not present

## 2023-05-16 DIAGNOSIS — M25562 Pain in left knee: Secondary | ICD-10-CM | POA: Diagnosis not present

## 2023-05-20 DIAGNOSIS — M25562 Pain in left knee: Secondary | ICD-10-CM | POA: Diagnosis not present

## 2023-05-23 DIAGNOSIS — M25562 Pain in left knee: Secondary | ICD-10-CM | POA: Diagnosis not present

## 2023-05-30 DIAGNOSIS — M25562 Pain in left knee: Secondary | ICD-10-CM | POA: Diagnosis not present

## 2023-06-03 ENCOUNTER — Other Ambulatory Visit: Payer: Self-pay

## 2023-06-03 ENCOUNTER — Telehealth: Payer: Self-pay | Admitting: Psychiatry

## 2023-06-03 DIAGNOSIS — F3341 Major depressive disorder, recurrent, in partial remission: Secondary | ICD-10-CM

## 2023-06-03 DIAGNOSIS — F900 Attention-deficit hyperactivity disorder, predominantly inattentive type: Secondary | ICD-10-CM

## 2023-06-03 DIAGNOSIS — M25562 Pain in left knee: Secondary | ICD-10-CM | POA: Diagnosis not present

## 2023-06-03 MED ORDER — METHYLPHENIDATE HCL 20 MG PO TABS: 20.0000 mg | ORAL_TABLET | Freq: Three times a day (TID) | ORAL | 0 refills | Status: DC

## 2023-06-03 NOTE — Telephone Encounter (Signed)
Katrina Gamble called as LM at 10:30 to request refill of her Ritalin 20mg .  Appt 10/17 Send to [id number] - [place], [place] - [address]

## 2023-06-03 NOTE — Telephone Encounter (Signed)
Pended.

## 2023-06-06 ENCOUNTER — Ambulatory Visit
Admission: RE | Admit: 2023-06-06 | Discharge: 2023-06-06 | Disposition: A | Discharge status: Home / Self Care | Payer: Medicare Other | Source: Ambulatory Visit | Attending: Family Medicine | Admitting: Family Medicine

## 2023-06-06 DIAGNOSIS — M25462 Effusion, left knee: Secondary | ICD-10-CM | POA: Diagnosis not present

## 2023-06-06 DIAGNOSIS — M23362 Other meniscus derangements, other lateral meniscus, left knee: Secondary | ICD-10-CM | POA: Diagnosis not present

## 2023-06-06 DIAGNOSIS — M8560 Other cyst of bone, unspecified site: Secondary | ICD-10-CM

## 2023-06-06 DIAGNOSIS — M23332 Other meniscus derangements, other medial meniscus, left knee: Secondary | ICD-10-CM | POA: Diagnosis not present

## 2023-06-06 DIAGNOSIS — M25562 Pain in left knee: Secondary | ICD-10-CM | POA: Diagnosis not present

## 2023-06-10 DIAGNOSIS — M25562 Pain in left knee: Secondary | ICD-10-CM | POA: Diagnosis not present

## 2023-06-13 DIAGNOSIS — M25562 Pain in left knee: Secondary | ICD-10-CM | POA: Diagnosis not present

## 2023-06-17 DIAGNOSIS — M25562 Pain in left knee: Secondary | ICD-10-CM | POA: Diagnosis not present

## 2023-06-19 DIAGNOSIS — M25562 Pain in left knee: Secondary | ICD-10-CM | POA: Diagnosis not present

## 2023-06-24 DIAGNOSIS — M25562 Pain in left knee: Secondary | ICD-10-CM | POA: Diagnosis not present

## 2023-06-27 ENCOUNTER — Ambulatory Visit: Payer: Medicare Other | Admitting: Psychiatry

## 2023-06-27 ENCOUNTER — Encounter: Payer: Self-pay | Admitting: Psychiatry

## 2023-06-27 DIAGNOSIS — F3341 Major depressive disorder, recurrent, in partial remission: Secondary | ICD-10-CM

## 2023-06-27 DIAGNOSIS — G250 Essential tremor: Secondary | ICD-10-CM | POA: Diagnosis not present

## 2023-06-27 DIAGNOSIS — F411 Generalized anxiety disorder: Secondary | ICD-10-CM | POA: Diagnosis not present

## 2023-06-27 DIAGNOSIS — Z79899 Other long term (current) drug therapy: Secondary | ICD-10-CM

## 2023-06-27 DIAGNOSIS — F332 Major depressive disorder, recurrent severe without psychotic features: Secondary | ICD-10-CM | POA: Diagnosis not present

## 2023-06-27 DIAGNOSIS — F331 Major depressive disorder, recurrent, moderate: Secondary | ICD-10-CM | POA: Diagnosis not present

## 2023-06-27 DIAGNOSIS — F900 Attention-deficit hyperactivity disorder, predominantly inattentive type: Secondary | ICD-10-CM | POA: Diagnosis not present

## 2023-06-27 DIAGNOSIS — R7989 Other specified abnormal findings of blood chemistry: Secondary | ICD-10-CM | POA: Diagnosis not present

## 2023-06-27 DIAGNOSIS — M25562 Pain in left knee: Secondary | ICD-10-CM | POA: Diagnosis not present

## 2023-06-27 MED ORDER — METHYLPHENIDATE HCL 20 MG PO TABS: 20.0000 mg | ORAL_TABLET | Freq: Three times a day (TID) | ORAL | 0 refills | Status: DC

## 2023-06-27 MED ORDER — OXCARBAZEPINE 150 MG PO TABS: 450.0000 mg | ORAL_TABLET | Freq: Every day | ORAL | 1 refills | Status: DC

## 2023-06-27 MED ORDER — LAMOTRIGINE 150 MG PO TABS: 150.0000 mg | ORAL_TABLET | Freq: Every day | ORAL | 1 refills | Status: DC

## 2023-06-27 MED ORDER — LITHIUM CARBONATE 300 MG PO CAPS: 300.0000 mg | ORAL_CAPSULE | Freq: Every day | ORAL | 1 refills | Status: DC

## 2023-06-27 NOTE — Progress Notes (Signed)
Katrina Gamble [id number] Oct 07, 1940 82 y.o.     Subjective:   Patient ID:  Katrina Gamble is a 82 y.o. (DOB 1941-06-01) female.  Chief Complaint:  Chief Complaint  Patient presents with   Follow-up   Depression   Anxiety   ADD   Stress    Depression        Associated symptoms include no decreased concentration, no fatigue and no suicidal ideas.  Past medical history includes anxiety.   Anxiety Symptoms include nervous/anxious behavior. Patient reports no chest pain, confusion, decreased concentration, dizziness, nausea, palpitations or suicidal ideas.    Medication Refill Associated symptoms include arthralgias. Pertinent negatives include no chest pain, fatigue or nausea.   Katrina Gamble presents today for follow-up of ADD and mood.  When t seen May 15, 2019.  She continued to have cycles of depression without clear precipitant.  It was suggested that she increase lithium to 450 mg daily check a lithium level.  seen July 15, 2019.  She was having more depression.  The following changes were made. Rec increase lithium to 600 mg daily for a week and check blood level. If this fails to help or is intolerable then recommend lamotrigine trial as noted. She continued Ritalin 20 mg 3 times daily per usual  No lithium level nor BMP were received.  seen August 20 2019.  She was still dealing with depression and had not seen any improvement in depression at 600 mg versus 450 mg daily.  She did not get a blood level. Reduce lithium to 450 mg daily DT NR at 600 daily  recommend lamotrigine trial as noted.  She called back December 30 stating she had been on lamotrigine for 2 weeks and was noticing a tremor and wondered if it was related.  BC of tremor she went down again from 450 mg in lithium and had reduced it to 300 mg instead of 450 mg.  seen October 15, 2019.  The following was noted: CO  Worsening tremor intentional affecting mouse use about  December 30.  Wonders about essential tremor vs TD.   Thyroid checked a couple months ago and dose was not changed.  Tremor in head interfered with dental procedures some.  Alcohol does not help tremor and  Never has.   Essential tremor dx decades ago. Mood is better with lamotrigine 100 mg daily.  Mood more level.   Plan: No meds were changed.  She continued the following: Continue lamotrigine 100 mg daily as it was helpful. Continue Ritalin as prescribed. Continue lithium 300 mg nightly.  So far her mood is been stable with a lower dose Not using much propranolol.  01/12/2020 appointment, the following is noted: Questions about the propranolol.  Still has sig tremor that varies in intensity from mild to severe.  Not always bothered by it unless doing things with fine motor control like writing.  Has taken up to 20 mg propranolol and not more bc ? GI SE.   Mood still good.  Dep controlled.  Anxiety hard to tell bc D with recurrent breast CA. Plan no meds changed.  04/13/2020 appointment with the following noted: Propranolol with limited effect on tremor even up to 50 mg daily.  Hard time writing. Worse with guilty, anxious depressed thoughts.  Neg self talk.  Not sure if anxiety triggered neg thinking.  No change stress or meds.  Sleep is disjointed.  Plan: Increase lamotrigine to 150 mg daily as it was helpful for several  months at 100. Continue Ritalin as prescribed. Continue lithium 300 mg nightly.   05/25/2020 appointment with the following noted: After increased lamotrigine to mood overall has been very good with mood.  Bad part is tremor has gotten much worse with intention.  Wonders about neurologist. No med changes  08/24/2020 appointment with the following noted:  Disc lithium and kidney function and questions about CKD 3. Missed neuro appt bc got sick right before it.  RS for FEB.  But opening for tomorrow.  Tremor is increasing . Insomnia is worse.  Trouble going to sleep for a  couple of hours.  3 nights per week. Racing thoughts all over the place.  Some are regretful thoughts from the past but random things.  Don't have the thoughts in the day. No naps. Cut caffeine max 2 cups coffee as late as 11 AM. Ritalin as late as 630 pm and usually that won't bother sleep.  Kids think her ADD meds aren't working well.  Scattered and hard to stay on task.   Not hyper spending or otherwise hyper.  Always tendency to talk if someone to listen. Can internally feel tense.  Not irritable. Doesn't want more meds. Pretty good with depression. Plan: The increase lamotrigine to 150 mg daily resolved the depression.  it was helpful for several months at 100. Continue Ritalin as prescribed 3/4 tablet QID.  Prefers Mal[employer]roft generic. She got different brand Accord of methylphenidate and it shatters when tries to quarter it. Continue lithium 300 mg nightly.  Agree with her plans to see neurology.  11/22/2020 appointment with following noted: Neurology diagnosed essential tremor with possible exacerbation by lithium and anxiety and suggested at the moment to withhold treatment for fear of worsening her balance and because the tremor was rather mild on exam.  MRI showed age-appropriate moderate chronic white matter disease. Pt doesn't think her tremor is mild as did neuro.  Worse with writing.  Disc neuro consult.  Disc propranolol dosing and risk of depression at higher dose.  Tremor noticeable in AM before coffee and affects writing and typing. Depression is under control.  Anxiety a little worse and experienced physically.  If get anxious then it builds on itself. Denies appetite disturbance.  Patient reports that energy and motivation have been good.  Patient denies any difficulty with concentration. Patient denies any suicidal ideation. Plan: Retry propranolol per her request 40 mg twice daily for a few days and if no benefit then increase to 50 mg twice daily for a few days. Can  increase every few days by 10 mg to a max of 80 mg twice daily as long as there are no side effects and you monitor blood pressure and pulse without significant changes or problems. The increase lamotrigine to 150 mg daily resolved the depression.  it was helpful for several months at 100. Continue Ritalin as prescribed 3/4 tablet QID.  Prefers Malincroft generic. She got different brand Accord of methylphenidate and it shatters when tries to quarter it. Continue lithium 300 mg nightly.   01/25/2021 appointment with the following noted: Difficult time adjusting to and fearful of changing meds.  Had number of stressors.   Since here # stressors.  GS rehab alcohol and bipolar disorder.   Recognizes pattern of avoidance in her life.  Plans to still try the propranolol bc the tremor is better. Patient reports stable mood and denies depressed or irritable moods.   Patient denies difficulty with sleep initiation or maintenance. Denies appetite disturbance.  Patient reports that energy and motivation have been good.  Patient denies any difficulty with concentration.  Patient denies any suicidal ideation.  Consistent with meds. Pllan: Retry propranolol when she feel ready per her request 40 mg twice daily for a few days and if no benefit then increase to 50 mg twice daily for a few days. Can increase every few days by 10 mg to a max of 80 mg twice daily as long as there are no side effects and you monitor blood pressure and pulse without significant changes or problems. It can help both tremor and anxiety. The increase lamotrigine to 150 mg daily resolved the depression.  it was helpful for several months at 100. Continue Ritalin as prescribed 3/4 tablet QID.  Prefers Malincroft generic. She got different brand Accord of methylphenidate and it shatters when tries to quarter it. Continue lithium 300 mg nightly.   05/11/2021 appointment with the following noted: Continues lamotrigine 150 mg daily, lithium  300 mg daily, Ritalin 20 mg 3 times daily.  Has not been taking propranolol which was prescribed last visit for tremor and anxiety.  She never tried it.  She didn't feel it was bad enough to warrant treatment.  Tremor affects use of mouse.  Affects writing.   Has recurrence of obsessive thinking and trouble sleeping for a couple of mos.  Obs thoughts on things she's done or not done and that she's responsible for something bad that might happen..  Obs interfere with sleep and are worse.  Last night after 330 before asleep and awake 730.  Never nap and not really tired in the daytime.   Periods of several days in a row of Energizer bunny and then resolves. Plan: Start Trileptal 150 mg HS for 5 days then 300 mg HS.  If not better in a couple of weeks call.  07/10/2021 appointment with the following noted: Disc BP this am.  Not checking it at home.  Is having some back and leg pain today which might increase BP. Really helped anxiety with Trileptal 300 mg nightly.  Choose to go to sleep late.   Avg 4-7 hours sleep. Patient reports stable mood and denies depressed or irritable moods.  Patient denies any recent difficulty with anxiety.  Patient denies difficulty with sleep initiation or maintenance. Denies appetite disturbance.  Patient reports that energy and motivation have been good.  Patient denies any difficulty with concentration.  Patient denies any suicidal ideation. SE always kind of clutsy but may be a little more.  No balance issues in the day. Plan: The increase lamotrigine to 150 mg daily resolved the depression.  it was helpful for several months at 100. Continue Trileptal 300 mg nightly Continue lithium 300 mg nightly Continue Ritalin 20 mg tablets 3/4 to 1 tablet 3 times daily  10/09/2021 appointment with the following noted: 3 issues.  Main concerns are cognitive and tremor. 1 is cognitive seems atypical like with numbers like figuring years.  Computation.   More trouble folding fitted  sheets.  Thinks it is worse over 3-6 mos.   No sig trouble paying bills or doing checking account.  Pays online Has to reread recipes over longer period of time. Not as motivated for self care and getting out of the house.  Not markedly depressed otherwise. Anxiety is still better with Trileptal, but will tend to worry over somatic concerns and think the worst. Sleep is pretty good and sleeping later than usual. Trmor is worse.  Neuro was somewhat  dismissive of tremor.  Remote dx of essential tremor Lithium is successful for mood. Plan; no med changes except reduced lithium to 150 mg daily to see if tremor would be better.  Continue lamotrigine 150, Ritalin 20 mg tablets 3/4 tablets 3 times daily, oxcarbazepine 300 mg nightly,   12/07/21 appt noted: Tremor no better with less lithium. More mood cycling from depression to highs with energizer bunny to anxiety with sense of doom.   Periods of flashing or intrusive thoughts she had done something wrong to new great grand child. Still prefers Mal[employer]roft generic Ritalin. Plan: She wanted to try lower dose lithium 150 mg daily and this did not help tremor and mood cycling is worse, so increase lithium bak to300 mg daily.  03/05/2022 appointment with the following noted: Continues lamotrigine 150 mg daily, lithium 300 mg daily, oxcarbazepine 300 mg nightly, Ritalin 20 mg 3 times daily Last still having obsessive intrusive thoughts without reason.  She thinks it's related  to meds but reviewed med sequencing of changes do not provide an obvious med pcpt.   Obsessions primarily about doing something wrong that may harm others.  Can cause sleeplessness and panic feelings. Having insomnia initially with anxious thoughts.    No depression periods.  No hyperactive periods. Tremor seems worse affecting  typing. No PCP now.   Plan: increase oxcarb 450 HS  05/24/2022 appointment noted: Continues lamotrigine 150 mg daily, lithium 300 mg nightly, Trileptal 450  mg nightly, Ritalin 20 mg 3 times daily. Better with increase oxcarbazepine 450 mg HS with anxiety and insomnia. Last couple of weeks exceptionally organized and motivated.   Sleep is good but tendency to stay up late.  No racing thoughts at night.  No reckless impulsive behavior. No depression. Anxiety is better with oxcarb No sig SE. To [place] to visit lifelong friend next week. Plan no med changes  08/23/22 appt noted: No med changes: lamotrigine 150 mg daily, lithium 300 mg nightly, Trileptal 450 mg nightly, Ritalin 20 mg 3 times daily. Doing OK.  Overall fairly stable.  Holiday mode.  Indecisiveness and procrastinates at this time of year.   She has 25 people at her house at Christmas.   Sleep is good overall without further insomnia.  No mania.   Depression is under control.  Usually has it in November but not this year.   No sig SE. PCP referred her to Dr. Arbutus Leas at her request and they wouldn't accept her bc already seen another neuro.  Tremor has gotten worse with more trouble using the mouse.  She is aware of essential tremor weighted glove and gets the newsletter.  Functional.  11/22/22 appt noted: No med changes.   lamotrigine 150 mg daily, lithium 300 mg nightly, Trileptal 450 mg nightly, Ritalin 20 mg 3 times daily. Not as good since here.  Trouble organizing for her taxes ? ADD vs cognitive issues.  Loses things not new but more so.  Not getting lost driving.  Not leaving pots on stove.   Sometimes won't leave house for days at a time.  More than normal for her. ? Depression.  More trouble with motivation at home except periods of 3-4  days of mood swing into hypomania and will do things for a couple of days. Had FU neuro.    02/25/23 note:  No med changes Problems with hourse leak.  Asbestos.  Ongoing repair.  Not great at decision making.   Dep remains manageable.  No problems with meds.  Normal labs with PCP and expected subtherapeutic lithium as expected .  Lithium is  helping at low dose. Still benefit Ritalin.  Notice absence more than when takes it. Plan: No med changes: lamotrigine 150 mg daily, lithium 300 mg nightly, Trileptal 450 mg nightly, Ritalin 20 mg 3 times daily.  06/27/23 appt noted: State of constant turmoil with problems in the house.  Leaks caused damage.  Needed plumbing.   Still on meds.  No sig SE.  Meds seem ok and no sig change.   If misses Ritalin can't move or think. Stress of getting house fixed has caused some anxiety but as expected.    Holter monitor occ SVT and rare VT but not requiring treatment  H died 4 years ago  Youngest D relapse breast CA.  Stressful.  New great grand D [place].  Psych med history:  Abilify no response,  Mellaril,  Refuses Seroquel bc F problems with it.  Nardil hyper and goofy, Prozac with side effects, try cyclic antidepressants, Viibryd, Trintellix caused anxiety,  Wellbutrin which caused tremors and goofy which Lexapro toned down,  imipramine, Pristiq 100,  Vyvanse, Ritalin, history of Dexedrine during pregnancy which caused weight gain,  Cerefolin NAC,   lithium 600 tremor worse (started lithium 150 mg Nov 2018) We tried an increase lithium to 600 mg daily and check a blood level.   The increase in lithium did not help her mood and her made her tremor worse.  Lamotrigine 150 Trileptal 300 helped anxiety Propranolol 50 without help for tremor  Remote hosp for obs on dangerousness irrationally about water in a vase. and panic at [facility] and was on Nardil at the time. F took Seroquel for hallucinations with PD and associated dementia and SE  Review of Systems:  Review of Systems  Constitutional:  Negative for fatigue.  Cardiovascular:  Negative for chest pain and palpitations.  Gastrointestinal:  Negative for nausea.  Musculoskeletal:  Positive for arthralgias.       Knee pain  Neurological:  Positive for tremors. Negative for dizziness.       Tremor worse at lithium 600 mg  daily versus lithium 450 mg daily  Psychiatric/Behavioral:  Positive for sleep disturbance. Negative for agitation, behavioral problems, confusion, decreased concentration, dysphoric mood, hallucinations, self-injury and suicidal ideas. The patient is nervous/anxious. The patient is not hyperactive.     Medications: I have reviewed the patient's current medications.  Current Outpatient Medications  Medication Sig Dispense Refill   Cetirizine HCl (ZYRTEC ALLERGY) 10 MG CAPS 1 capsule     levothyroxine (SYNTHROID) 50 MCG tablet Take 50 mcg by mouth daily.     [START ON 08/22/2023] methylphenidate (RITALIN) 20 MG tablet Take 1 tablet (20 mg total) by mouth 3 (three) times daily with meals. 90 tablet 0   lamoTRIgine (LAMICTAL) 150 MG tablet Take 1 tablet (150 mg total) by mouth daily. 90 tablet 1   lithium carbonate 300 MG capsule Take 1 capsule (300 mg total) by mouth daily. 90 capsule 1   [START ON 07/25/2023] methylphenidate (RITALIN) 20 MG tablet Take 1 tablet (20 mg total) by mouth 3 (three) times daily with meals. 90 tablet 0   methylphenidate (RITALIN) 20 MG tablet Take 1 tablet (20 mg total) by mouth 3 (three) times daily with meals. 90 tablet 0   OXcarbazepine (TRILEPTAL) 150 MG tablet Take 3 tablets (450 mg total) by mouth at bedtime. 270 tablet 1   No current facility-administered medications for this visit.  Medication Side Effects: tremor is affecting handwriting and computer worse with 600 mg lithium..  Numbness in toes.  Hands and feet always cold.  Allergies:  Allergies  Allergen Reactions   Codeine Other (See Comments)    Abdominal pain  Other reaction(s): stomach upset   Latex Hives    Other reaction(s): clammy, "feeling horrible"   Septra [Sulfamethoxazole-Trimethoprim] Rash   Sulfa Antibiotics Rash and Other (See Comments)    Other reaction(s): rash    Past Medical History:  Diagnosis Date   ADD (attention deficit disorder)    Basal cell carcinoma     Depression    Diverticulitis    Hypothyroid    d/t lithium   Osteopenia 11/2017   T score -1.1 FRAX 24% / 14%   Peripheral neuropathy    Tremor, essential    Vitamin D deficiency 12/2017    Family History  Problem Relation Age of Onset   Hypertension Mother    Thyroid disease Mother    Breast cancer Mother 45   Other Father        Brain tumor   Parkinson's disease Father    Dementia Father    Pneumonia Father    Leukemia Maternal Grandmother    Stomach cancer Paternal Grandmother    Heart disease Paternal Grandfather    Breast cancer Daughter 68   Thyroid disease Daughter    Cancer Daughter        Melanoma   Tremor Neg Hx     Social History   Socioeconomic History   Marital status: Widowed    Spouse name: Not on file   Number of children: 3   Years of education: Not on file   Highest education level: Bachelor's degree (e.g., BA, AB, BS)  Occupational History    Comment: retired  Tobacco Use   Smoking status: Former    Current packs/day: 0.00    Average packs/day: 0.5 packs/day for 30.0 years (15.0 ttl pk-yrs)    Types: Cigarettes    Start date: 35    Quit date: 2012    Years since quitting: 12.8   Smokeless tobacco: Never  Vaping Use   Vaping status: Never Used  Substance and Sexual Activity   Alcohol use: Not Currently    Alcohol/week: 10.0 standard drinks of alcohol    Types: 10 Glasses of wine per week   Drug use: Never   Sexual activity: Not Currently    Birth control/protection: Post-menopausal    Comment: 1st intercourse 82 yo-Fewer than 5 partners  Other Topics Concern   Not on file  Social History Narrative   Lives alone   Caffeine- 1 c coffee   Social Determinants of Health   Financial Resource Strain: Not on file  Food Insecurity: Not on file  Transportation Needs: Not on file  Physical Activity: Not on file  Stress: Not on file  Social Connections: Unknown (01/23/2022)   [facility], [facility]   Social Network     Social Network: Not on file  Intimate Partner Violence: Unknown (12/15/2021)   Received from [facility], [facility]   HITS    Physically Hurt: Not on file    Insult or Talk Down To: Not on file    Threaten Physical Harm: Not on file    Scream or Curse: Not on file    Past Medical History, Surgical history, Social history, and Family history were reviewed and updated as appropriate.   Please see review of systems for further  details on the patient's review from today.   Objective:   Physical Exam:  There were no vitals taken for this visit.  Physical Exam Constitutional:      General: She is not in acute distress.    Appearance: She is well-developed and normal weight.  Musculoskeletal:        General: No deformity.  Neurological:     Mental Status: She is alert and oriented to person, place, and time.     Cranial Nerves: No dysarthria.     Motor: Tremor present.     Coordination: Coordination normal.  Psychiatric:        Attention and Perception: Perception normal. She is inattentive. She does not perceive auditory or visual hallucinations.        Mood and Affect: Mood is not anxious or depressed. Affect is not labile, blunt, angry or tearful.        Speech: Speech normal. Speech is not rapid and pressured.        Behavior: Behavior normal. Behavior is cooperative.        Thought Content: Thought content is not paranoid or delusional. Thought content does not include homicidal or suicidal ideation. Thought content does not include suicidal plan.        Cognition and Memory: Cognition and memory normal.        Judgment: Judgment normal.     Comments: Good insight Dep seems managed Lately anxiety and obs thoughts are better   objectively the patient does not show significant mouth movements but she displayed video at prior visit in which typical mouth movements cheek movements associated with tardive dyskinesia was evident.  Her tremor is rhythmic and is not supportive  of a tardive dyskinesia type tremor. Relatively mild facial movements at this time.  Lab Review:     Component Value Date/Time   NA 139 08/25/2020 1410   K 4.3 08/25/2020 1410   CL 103 08/25/2020 1410   CO2 22 08/25/2020 1410   GLUCOSE 85 08/25/2020 1410   BUN 11 08/25/2020 1410   CREATININE 0.93 08/25/2020 1410   CALCIUM 9.7 08/25/2020 1410   PROT 6.7 08/25/2020 1410   ALBUMIN 4.3 08/25/2020 1410   AST 22 08/25/2020 1410   ALT 16 08/25/2020 1410   ALKPHOS 69 08/25/2020 1410   BILITOT 0.5 08/25/2020 1410   GFRNONAA 59 (L) 08/25/2020 1410   GFRAA 69 08/25/2020 1410    No results found for: "WBC", "RBC", "HGB", "HCT", "PLT", "MCV", "MCH", "MCHC", "RDW", "LYMPHSABS", "MONOABS", "EOSABS", "BASOSABS"  No results found for: "POCLITH", "LITHIUM"   No results found for: "PHENYTOIN", "PHENOBARB", "VALPROATE", "CBMZ"   She says lithium level was 0.6 at PCP on 450 mg daily.   07/2020 lithium low therapeutic by her report [facility]  Specifically her lithium level May 17, 2019 on this dosage of lithium was 0.3.  Her urinalysis was normal she had a normal CBC, comprehensive metabolic panel was normal including creatinine 0.83 calcium 9.9 and TSH was normal at 2.83.  Lithium level in April 2019 was 0.2 at this dosage.  We will not increase the lithium because she is currently having tremor at the low dose.  .res Assessment: Plan:    Depression, major, recurrent, in partial remission (HCC) - Plan: methylphenidate (RITALIN) 20 MG tablet, OXcarbazepine (TRILEPTAL) 150 MG tablet  Generalized anxiety disorder - Plan: OXcarbazepine (TRILEPTAL) 150 MG tablet  Attention deficit hyperactivity disorder (ADHD), predominantly inattentive type - Plan: methylphenidate (RITALIN) 20 MG tablet, methylphenidate (RITALIN) 20 MG  tablet, methylphenidate (RITALIN) 20 MG tablet  Tremor, essential  Low serum vitamin D  Lithium use  Severe episode of recurrent major depressive disorder, without psychotic  features (HCC) - Plan: lamoTRIgine (LAMICTAL) 150 MG tablet  Major depressive disorder, recurrent episode, moderate (HCC) - Plan: lithium carbonate 300 MG capsule   30-minute face to face time with patient was spent on counseling and coordination of care. We discussed Kimorah has a long history over decades of cycling major depression and treatment resistance to meds noted above of nearly every antidepressant category.  Was more anxious and obsessive with mixed type sx until increase oxcarbazepine 450 mg HS.  Pt has had what she describes as mild hypomanic episodes in the past in response to antidepressants primarily.  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  Not been depressed lately with increase lamotrigine and less anxiety with Trileptal. Mood cycling worse with lithium reduction from 300 to 150 mg and better with 300 mg. While she does not identify herself is bipolar because her upswings seem only normal to her.  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 Usually November worse month of year.    Overall is much more stable with current med regimen than she has been in the past.  So no changes indicated.  Obsessions she made a mistake which will cause harm to others are better.  Lowest risk mood stabilizing med should be Trileptal.  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   Benefit Trileptal for anxiety and somewhat for sleep even better with increase. Continue Trileptal to 450  mg HS for anxiety and insomnia.   Her best response so far has been from low-dose lithium with a stimulant but lamotrigine has seemed to help for 6 mos in 2020 until relapse in Summer.  The depression  resolved again with the increase in lamotrigine from 100 to 150 mg daily.  They do not follow any pattern and do not appear to have any precipitant.   Cognitive complaints but not substantially impairing at this time.  She doesn't want cognitive enhancing meds.  Cerefolin NAC for a month with unclear effects. Rec trial Disc the off-label use of N-Acetylcysteine at 600 mg daily to help with mild cognitive problems.  It can be combined with a B-complex vitamin as the B-12 and folate have been shown to sometimes enhance the effect.  Gave her samples in the past. Option Namenda.  She'd rather not add more meds.  We tried an increase lithium to 600 mg daily and check a blood level.   The increase in lithium did not help her mood and her made her tremor worse. Counseled patient regarding potential benefits, risks, and side effects of lithium to include potential risk of lithium affecting thyroid and renal function.  Discussed need for periodic lab monitoring to determine drug level and to assess for potential adverse effects.  Counseled patient regarding signs and symptoms of lithium toxicity and advised that they notify office immediately or seek urgent medical attention if experiencing these signs and symptoms.  Patient advised to contact office with any questions or concerns.    The tremor predates lithium and predates stimulant use but appears to  have been made worse by the increase in lamotrigine.  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  She saw a neurologist. Does not really want to add meds for tremor.  the increase lamotrigine to 150 mg daily resolved the depression.  it was helpful for several months at 100. Consider reducing lamotrigine to see if obs thoughts get better.  Consider increasing for mood.  On lithium 300 mg nightly.  It appears she cannot tolerate a higher dose.  Consider reduction to reduce tremor possibly given improvement from lamotrigine. She wanted to try lower  dose lithium 150 mg daily and this did not help tremor and mood cycling is worse, so increase lithium back to 300 mg daily.  Discussed potential benefits, risks, and side effects of stimulants with patient to include increased heart rate, palpitations, insomnia, increased anxiety, increased irritability, or decreased appetite.  Instructed patient to contact office if experiencing any significant tolerability issues. Pleased she got the [place] generic Ritalin bc easier to cut. Continue Ritalin as prescribed 3/4 tablet QID.  Prefers [place] generic. She got different brand [employer] of methylphenidate and it shatters when tries to quarter it.  Continue  Vitamin D. Disc reasons for doing so for mental and physical health in detail.  She has stopped bc she hates taking pills.  Supportive therapy dealing with GS's mental health problems.  Labs per PCP [facility].  No problems.  05/2023 level below normal range  No med changes: lamotrigine 150 mg daily, lithium 300 mg nightly, Trileptal 450 mg nightly, Ritalin 20 mg 3 times daily.  FU  3-4 mos  Meredith Staggers, MD, DFAPA '   Please see After Visit Summary for patient specific instructions.  No future appointments.    No orders of the defined types were placed in this encounter.      -------------------------------

## 2023-07-02 DIAGNOSIS — M25562 Pain in left knee: Secondary | ICD-10-CM | POA: Diagnosis not present

## 2023-07-03 DIAGNOSIS — M25562 Pain in left knee: Secondary | ICD-10-CM | POA: Diagnosis not present

## 2023-07-26 DIAGNOSIS — Z23 Encounter for immunization: Secondary | ICD-10-CM | POA: Diagnosis not present

## 2023-07-30 ENCOUNTER — Other Ambulatory Visit: Payer: Self-pay | Admitting: Family Medicine

## 2023-07-30 DIAGNOSIS — Z1231 Encounter for screening mammogram for malignant neoplasm of breast: Secondary | ICD-10-CM

## 2023-08-20 ENCOUNTER — Other Ambulatory Visit: Payer: Self-pay | Admitting: Family Medicine

## 2023-08-20 DIAGNOSIS — E2839 Other primary ovarian failure: Secondary | ICD-10-CM

## 2023-08-20 DIAGNOSIS — S83207D Unspecified tear of unspecified meniscus, current injury, left knee, subsequent encounter: Secondary | ICD-10-CM | POA: Diagnosis not present

## 2023-08-20 DIAGNOSIS — Z0000 Encounter for general adult medical examination without abnormal findings: Secondary | ICD-10-CM | POA: Diagnosis not present

## 2023-08-20 DIAGNOSIS — E559 Vitamin D deficiency, unspecified: Secondary | ICD-10-CM | POA: Diagnosis not present

## 2023-08-30 ENCOUNTER — Ambulatory Visit
Admission: RE | Admit: 2023-08-30 | Discharge: 2023-08-30 | Disposition: A | Discharge status: Home / Self Care | Payer: Medicare Other | Source: Ambulatory Visit | Attending: Family Medicine | Admitting: Family Medicine

## 2023-08-30 DIAGNOSIS — Z1231 Encounter for screening mammogram for malignant neoplasm of breast: Secondary | ICD-10-CM

## 2023-09-24 DIAGNOSIS — D229 Melanocytic nevi, unspecified: Secondary | ICD-10-CM | POA: Diagnosis not present

## 2023-09-24 DIAGNOSIS — L821 Other seborrheic keratosis: Secondary | ICD-10-CM | POA: Diagnosis not present

## 2023-09-24 DIAGNOSIS — D1801 Hemangioma of skin and subcutaneous tissue: Secondary | ICD-10-CM | POA: Diagnosis not present

## 2023-09-24 DIAGNOSIS — B078 Other viral warts: Secondary | ICD-10-CM | POA: Diagnosis not present

## 2023-09-24 DIAGNOSIS — L814 Other melanin hyperpigmentation: Secondary | ICD-10-CM | POA: Diagnosis not present

## 2023-09-24 DIAGNOSIS — L570 Actinic keratosis: Secondary | ICD-10-CM | POA: Diagnosis not present

## 2023-09-24 DIAGNOSIS — L578 Other skin changes due to chronic exposure to nonionizing radiation: Secondary | ICD-10-CM | POA: Diagnosis not present

## 2023-10-02 ENCOUNTER — Encounter: Payer: Self-pay | Admitting: Psychiatry

## 2023-10-02 ENCOUNTER — Ambulatory Visit: Payer: Medicare Other | Admitting: Psychiatry

## 2023-10-02 DIAGNOSIS — R7989 Other specified abnormal findings of blood chemistry: Secondary | ICD-10-CM

## 2023-10-02 DIAGNOSIS — G2401 Drug induced subacute dyskinesia: Secondary | ICD-10-CM | POA: Diagnosis not present

## 2023-10-02 DIAGNOSIS — Z79899 Other long term (current) drug therapy: Secondary | ICD-10-CM | POA: Diagnosis not present

## 2023-10-02 DIAGNOSIS — F900 Attention-deficit hyperactivity disorder, predominantly inattentive type: Secondary | ICD-10-CM | POA: Diagnosis not present

## 2023-10-02 DIAGNOSIS — F3341 Major depressive disorder, recurrent, in partial remission: Secondary | ICD-10-CM | POA: Diagnosis not present

## 2023-10-02 DIAGNOSIS — F411 Generalized anxiety disorder: Secondary | ICD-10-CM

## 2023-10-02 DIAGNOSIS — G250 Essential tremor: Secondary | ICD-10-CM

## 2023-10-02 MED ORDER — METHYLPHENIDATE HCL ER (OSM) 54 MG PO TBCR: 54.0000 mg | EXTENDED_RELEASE_TABLET | ORAL | 0 refills | Status: DC

## 2023-10-02 NOTE — Progress Notes (Signed)
Katrina Gamble [id number] 1940-09-25 83 y.o.     Subjective:   Patient ID:  Katrina Gamble is a 83 y.o. (DOB 1940-12-27) female.  Chief Complaint:  Chief Complaint  Patient presents with   Follow-up   Depression   ADD    Depression        Associated symptoms include no decreased concentration, no fatigue and no suicidal ideas.  Past medical history includes anxiety.   Anxiety Symptoms include nervous/anxious behavior. Patient reports no chest pain, confusion, decreased concentration, dizziness, nausea, palpitations or suicidal ideas.    Medication Refill Associated symptoms include arthralgias. Pertinent negatives include no chest pain, fatigue or nausea.   Katrina Gamble presents today for follow-up of ADD and mood.  When t seen May 15, 2019.  She continued to have cycles of depression without clear precipitant.  It was suggested that she increase lithium to 450 mg daily check a lithium level.  seen July 15, 2019.  She was having more depression.  The following changes were made. Rec increase lithium to 600 mg daily for a week and check blood level. If this fails to help or is intolerable then recommend lamotrigine trial as noted. She continued Ritalin 20 mg 3 times daily per usual  No lithium level nor BMP were received.  seen August 20 2019.  She was still dealing with depression and had not seen any improvement in depression at 600 mg versus 450 mg daily.  She did not get a blood level. Reduce lithium to 450 mg daily DT NR at 600 daily  recommend lamotrigine trial as noted.  She called back December 30 stating she had been on lamotrigine for 2 weeks and was noticing a tremor and wondered if it was related.  BC of tremor she went down again from 450 mg in lithium and had reduced it to 300 mg instead of 450 mg.  seen October 15, 2019.  The following was noted: CO  Worsening tremor intentional affecting mouse use about December 30.  Wonders about  essential tremor vs TD.   Thyroid checked a couple months ago and dose was not changed.  Tremor in head interfered with dental procedures some.  Alcohol does not help tremor and  Never has.   Essential tremor dx decades ago. Mood is better with lamotrigine 100 mg daily.  Mood more level.   Plan: No meds were changed.  She continued the following: Continue lamotrigine 100 mg daily as it was helpful. Continue Ritalin as prescribed. Continue lithium 300 mg nightly.  So far her mood is been stable with a lower dose Not using much propranolol.  01/12/2020 appointment, the following is noted: Questions about the propranolol.  Still has sig tremor that varies in intensity from mild to severe.  Not always bothered by it unless doing things with fine motor control like writing.  Has taken up to 20 mg propranolol and not more bc ? GI SE.   Mood still good.  Dep controlled.  Anxiety hard to tell bc D with recurrent breast CA. Plan no meds changed.  04/13/2020 appointment with the following noted: Propranolol with limited effect on tremor even up to 50 mg daily.  Hard time writing. Worse with guilty, anxious depressed thoughts.  Neg self talk.  Not sure if anxiety triggered neg thinking.  No change stress or meds.  Sleep is disjointed.  Plan: Increase lamotrigine to 150 mg daily as it was helpful for several months at 100. Continue Ritalin as  prescribed. Continue lithium 300 mg nightly.   05/25/2020 appointment with the following noted: After increased lamotrigine to mood overall has been very good with mood.  Bad part is tremor has gotten much worse with intention.  Wonders about neurologist. No med changes  08/24/2020 appointment with the following noted:  Disc lithium and kidney function and questions about CKD 3. Missed neuro appt bc got sick right before it.  RS for FEB.  But opening for tomorrow.  Tremor is increasing . Insomnia is worse.  Trouble going to sleep for a couple of hours.  3 nights  per week. Racing thoughts all over the place.  Some are regretful thoughts from the past but random things.  Don't have the thoughts in the day. No naps. Cut caffeine max 2 cups coffee as late as 11 AM. Ritalin as late as 630 pm and usually that won't bother sleep.  Kids think her ADD meds aren't working well.  Scattered and hard to stay on task.   Not hyper spending or otherwise hyper.  Always tendency to talk if someone to listen. Can internally feel tense.  Not irritable. Doesn't want more meds. Pretty good with depression. Plan: The increase lamotrigine to 150 mg daily resolved the depression.  it was helpful for several months at 100. Continue Ritalin as prescribed 3/4 tablet QID.  Prefers Malincroft generic. She got different brand Accord of methylphenidate and it shatters when tries to quarter it. Continue lithium 300 mg nightly.  Agree with her plans to see neurology.  11/22/2020 appointment with following noted: Neurology diagnosed essential tremor with possible exacerbation by lithium and anxiety and suggested at the moment to withhold treatment for fear of worsening her balance and because the tremor was rather mild on exam.  MRI showed age-appropriate moderate chronic white matter disease. Pt doesn't think her tremor is mild as did neuro.  Worse with writing.  Disc neuro consult.  Disc propranolol dosing and risk of depression at higher dose.  Tremor noticeable in AM before coffee and affects writing and typing. Depression is under control.  Anxiety a little worse and experienced physically.  If get anxious then it builds on itself. Denies appetite disturbance.  Patient reports that energy and motivation have been good.  Patient denies any difficulty with concentration. Patient denies any suicidal ideation. Plan: Retry propranolol per her request 40 mg twice daily for a few days and if no benefit then increase to 50 mg twice daily for a few days. Can increase every few days by 10 mg  to a max of 80 mg twice daily as long as there are no side effects and you monitor blood pressure and pulse without significant changes or problems. The increase lamotrigine to 150 mg daily resolved the depression.  it was helpful for several months at 100. Continue Ritalin as prescribed 3/4 tablet QID.  Prefers Malincroft generic. She got different brand Accord of methylphenidate and it shatters when tries to quarter it. Continue lithium 300 mg nightly.   01/25/2021 appointment with the following noted: Difficult time adjusting to and fearful of changing meds.  Had number of stressors.   Since here # stressors.  GS rehab alcohol and bipolar disorder.   Recognizes pattern of avoidance in her life.  Plans to still try the propranolol bc the tremor is better. Patient reports stable mood and denies depressed or irritable moods.   Patient denies difficulty with sleep initiation or maintenance. Denies appetite disturbance.  Patient reports that energy and motivation  have been good.  Patient denies any difficulty with concentration.  Patient denies any suicidal ideation.  Consistent with meds. Pllan: Retry propranolol when she feel ready per her request 40 mg twice daily for a few days and if no benefit then increase to 50 mg twice daily for a few days. Can increase every few days by 10 mg to a max of 80 mg twice daily as long as there are no side effects and you monitor blood pressure and pulse without significant changes or problems. It can help both tremor and anxiety. The increase lamotrigine to 150 mg daily resolved the depression.  it was helpful for several months at 100. Continue Ritalin as prescribed 3/4 tablet QID.  Prefers Malincroft generic. She got different brand Accord of methylphenidate and it shatters when tries to quarter it. Continue lithium 300 mg nightly.   05/11/2021 appointment with the following noted: Continues lamotrigine 150 mg daily, lithium 300 mg daily, Ritalin 20 mg 3 times  daily.  Has not been taking propranolol which was prescribed last visit for tremor and anxiety.  She never tried it.  She didn't feel it was bad enough to warrant treatment.  Tremor affects use of mouse.  Affects writing.   Has recurrence of obsessive thinking and trouble sleeping for a couple of mos.  Obs thoughts on things she's done or not done and that she's responsible for something bad that might happen..  Obs interfere with sleep and are worse.  Last night after 330 before asleep and awake 730.  Never nap and not really tired in the daytime.   Periods of several days in a row of Energizer bunny and then resolves. Plan: Start Trileptal 150 mg HS for 5 days then 300 mg HS.  If not better in a couple of weeks call.  07/10/2021 appointment with the following noted: Disc BP this am.  Not checking it at home.  Is having some back and leg pain today which might increase BP. Really helped anxiety with Trileptal 300 mg nightly.  Choose to go to sleep late.   Avg 4-7 hours sleep. Patient reports stable mood and denies depressed or irritable moods.  Patient denies any recent difficulty with anxiety.  Patient denies difficulty with sleep initiation or maintenance. Denies appetite disturbance.  Patient reports that energy and motivation have been good.  Patient denies any difficulty with concentration.  Patient denies any suicidal ideation. SE always kind of clutsy but may be a little more.  No balance issues in the day. Plan: The increase lamotrigine to 150 mg daily resolved the depression.  it was helpful for several months at 100. Continue Trileptal 300 mg nightly Continue lithium 300 mg nightly Continue Ritalin 20 mg tablets 3/4 to 1 tablet 3 times daily  10/09/2021 appointment with the following noted: 3 issues.  Main concerns are cognitive and tremor. 1 is cognitive seems atypical like with numbers like figuring years.  Computation.   More trouble folding fitted sheets.  Thinks it is worse over 3-6  mos.   No sig trouble paying bills or doing checking account.  Pays online Has to reread recipes over longer period of time. Not as motivated for self care and getting out of the house.  Not markedly depressed otherwise. Anxiety is still better with Trileptal, but will tend to worry over somatic concerns and think the worst. Sleep is pretty good and sleeping later than usual. Trmor is worse.  Neuro was somewhat dismissive of tremor.  Remote dx  of essential tremor Lithium is successful for mood. Plan; no med changes except reduced lithium to 150 mg daily to see if tremor would be better.  Continue lamotrigine 150, Ritalin 20 mg tablets 3/4 tablets 3 times daily, oxcarbazepine 300 mg nightly,   12/07/21 appt noted: Tremor no better with less lithium. More mood cycling from depression to highs with energizer bunny to anxiety with sense of doom.   Periods of flashing or intrusive thoughts she had done something wrong to new great grand child. Still prefers Malincroft generic Ritalin. Plan: She wanted to try lower dose lithium 150 mg daily and this did not help tremor and mood cycling is worse, so increase lithium bak to300 mg daily.  03/05/2022 appointment with the following noted: Continues lamotrigine 150 mg daily, lithium 300 mg daily, oxcarbazepine 300 mg nightly, Ritalin 20 mg 3 times daily Last still having obsessive intrusive thoughts without reason.  She thinks it's related  to meds but reviewed med sequencing of changes do not provide an obvious med pcpt.   Obsessions primarily about doing something wrong that may harm others.  Can cause sleeplessness and panic feelings. Having insomnia initially with anxious thoughts.    No depression periods.  No hyperactive periods. Tremor seems worse affecting  typing. No PCP now.   Plan: increase oxcarb 450 HS  05/24/2022 appointment noted: Continues lamotrigine 150 mg daily, lithium 300 mg nightly, Trileptal 450 mg nightly, Ritalin 20 mg 3 times  daily. Better with increase oxcarbazepine 450 mg HS with anxiety and insomnia. Last couple of weeks exceptionally organized and motivated.   Sleep is good but tendency to stay up late.  No racing thoughts at night.  No reckless impulsive behavior. No depression. Anxiety is better with oxcarb No sig SE. To [place] to visit lifelong friend next week. Plan no med changes  08/23/22 appt noted: No med changes: lamotrigine 150 mg daily, lithium 300 mg nightly, Trileptal 450 mg nightly, Ritalin 20 mg 3 times daily. Doing OK.  Overall fairly stable.  Holiday mode.  Indecisiveness and procrastinates at this time of year.   She has 25 people at her house at Christmas.   Sleep is good overall without further insomnia.  No mania.   Depression is under control.  Usually has it in November but not this year.   No sig SE. PCP referred her to Dr. Arbutus Leas at her request and they wouldn't accept her bc already seen another neuro.  Tremor has gotten worse with more trouble using the mouse.  She is aware of essential tremor weighted glove and gets the newsletter.  Functional.  11/22/22 appt noted: No med changes.   lamotrigine 150 mg daily, lithium 300 mg nightly, Trileptal 450 mg nightly, Ritalin 20 mg 3 times daily. Not as good since here.  Trouble organizing for her taxes ? ADD vs cognitive issues.  Loses things not new but more so.  Not getting lost driving.  Not leaving pots on stove.   Sometimes won't leave house for days at a time.  More than normal for her. ? Depression.  More trouble with motivation at home except periods of 3-4  days of mood swing into hypomania and will do things for a couple of days. Had FU neuro.    02/25/23 note:  No med changes Problems with hourse leak.  Asbestos.  Ongoing repair.  Not great at decision making.   Dep remains manageable.  No problems with meds.   Normal labs with PCP and expected  subtherapeutic lithium as expected .  Lithium is helping at low dose. Still benefit  Ritalin.  Notice absence more than when takes it. Plan: No med changes: lamotrigine 150 mg daily, lithium 300 mg nightly, Trileptal 450 mg nightly, Ritalin 20 mg 3 times daily.  06/27/23 appt noted: State of constant turmoil with problems in the house.  Leaks caused damage.  Needed plumbing.   Still on meds.  No sig SE.  Meds seem ok and no sig change.   If misses Ritalin can't move or think. Stress of getting house fixed has caused some anxiety but as expected.   Plan no changes  10/02/23 appt noted: Meds as above Big plumbing issues at home ongoing and  living in 1 room at a time for 6 mos.  Also found asbestos in the home.  Kind of getting back to normal.   Makes me sad at times.  Normal reaction to what she's dealing with.  Pretty even keel otherwise.   No SE.   Consistent with meds.   Patient reports stable mood and denies depressed or irritable moods.  Patient denies any recent difficulty with anxiety.  Patient denies difficulty with sleep initiation or maintenance. Denies appetite disturbance.  Patient reports that energy and motivation have been good.  Patient denies any difficulty with concentration.  Patient denies any suicidal ideation. No major cognitive concerns but some frustrations with mild forgetfulness.   Some frustrations with short acting aspects of Ritalin.  Holter monitor occ SVT and rare VT but not requiring treatment   H died 4 years ago  Youngest D relapse breast CA.  Stressful.  New great grand D [place].  Psych med history:  Abilify no response,  Mellaril,  Refuses Seroquel bc F problems with it.  Nardil hyper and goofy, Prozac with side effects, try cyclic antidepressants, Viibryd, Trintellix caused anxiety,  Wellbutrin which caused tremors and goofy which Lexapro toned down,  imipramine, Pristiq 100,  Vyvanse, Ritalin, history of Dexedrine during pregnancy which caused weight gain,  Cerefolin NAC,   lithium 600 tremor worse (started lithium 150 mg Nov  2018) We tried an increase lithium to 600 mg daily and check a blood level.   The increase in lithium did not help her mood and her made her tremor worse.  Lamotrigine 150 Trileptal 300 helped anxiety Propranolol 50 without help for tremor  Remote hosp for obs on dangerousness irrationally about water in a vase. and panic at [facility] and was on Nardil at the time. F took Seroquel for hallucinations with PD and associated dementia and SE  Review of Systems:  Review of Systems  Constitutional:  Negative for fatigue.  Cardiovascular:  Negative for chest pain and palpitations.  Gastrointestinal:  Negative for nausea.  Musculoskeletal:  Positive for arthralgias.       Knee pain ongoing  Neurological:  Positive for tremors. Negative for dizziness.       Tremor worse at lithium 600 mg daily versus lithium 450 mg daily  Psychiatric/Behavioral:  Positive for sleep disturbance. Negative for agitation, behavioral problems, confusion, decreased concentration, dysphoric mood, hallucinations, self-injury and suicidal ideas. The patient is nervous/anxious. The patient is not hyperactive.     Medications: I have reviewed the patient's current medications.  Current Outpatient Medications  Medication Sig Dispense Refill   Cetirizine HCl (ZYRTEC ALLERGY) 10 MG CAPS 1 capsule     lamoTRIgine (LAMICTAL) 150 MG tablet Take 1 tablet (150 mg total) by mouth daily. 90 tablet 1  levothyroxine (SYNTHROID) 50 MCG tablet Take 50 mcg by mouth daily.     lithium carbonate 300 MG capsule Take 1 capsule (300 mg total) by mouth daily. 90 capsule 1   methylphenidate (RITALIN) 20 MG tablet Take 1 tablet (20 mg total) by mouth 3 (three) times daily with meals. 90 tablet 0   methylphenidate (RITALIN) 20 MG tablet Take 1 tablet (20 mg total) by mouth 3 (three) times daily with meals. 90 tablet 0   methylphenidate (RITALIN) 20 MG tablet Take 1 tablet (20 mg total) by mouth 3 (three) times daily with meals. 90  tablet 0   OXcarbazepine (TRILEPTAL) 150 MG tablet Take 3 tablets (450 mg total) by mouth at bedtime. 270 tablet 1   VITAMIN D PO Take 5,000 Units by mouth daily.     No current facility-administered medications for this visit.    Medication Side Effects: tremor is affecting handwriting and computer worse with 600 mg lithium..  Numbness in toes.  Hands and feet always cold.  Allergies:  Allergies  Allergen Reactions   Codeine Other (See Comments)    Abdominal pain  Other reaction(s): stomach upset   Latex Hives    Other reaction(s): clammy, "feeling horrible"   Septra [Sulfamethoxazole-Trimethoprim] Rash   Sulfa Antibiotics Rash and Other (See Comments)    Other reaction(s): rash    Past Medical History:  Diagnosis Date   ADD (attention deficit disorder)    Basal cell carcinoma    Depression    Diverticulitis    Hypothyroid    d/t lithium   Osteopenia 11/2017   T score -1.1 FRAX 24% / 14%   Peripheral neuropathy    Tremor, essential    Vitamin D deficiency 12/2017    Family History  Problem Relation Age of Onset   Hypertension Mother    Thyroid disease Mother    Breast cancer Mother 1   Other Father        Brain tumor   Parkinson's disease Father    Dementia Father    Pneumonia Father    Breast cancer Daughter 40       recur x 3 (4 times - total)   Thyroid disease Daughter    Cancer Daughter        Melanoma   Leukemia Maternal Grandmother    Stomach cancer Paternal Grandmother    Heart disease Paternal Grandfather    Tremor Neg Hx     Social History   Socioeconomic History   Marital status: Widowed    Spouse name: Not on file   Number of children: 3   Years of education: Not on file   Highest education level: Bachelor's degree (e.g., BA, AB, BS)  Occupational History    Comment: retired  Tobacco Use   Smoking status: Former    Current packs/day: 0.00    Average packs/day: 0.5 packs/day for 30.0 years (15.0 ttl pk-yrs)    Types: Cigarettes     Start date: 57    Quit date: 2012    Years since quitting: 13.0   Smokeless tobacco: Never  Vaping Use   Vaping status: Never Used  Substance and Sexual Activity   Alcohol use: Not Currently    Alcohol/week: 10.0 standard drinks of alcohol    Types: 10 Glasses of wine per week   Drug use: Never   Sexual activity: Not Currently    Birth control/protection: Post-menopausal    Comment: 1st intercourse 83 yo-Fewer than 5 partners  Other Topics Concern  Not on file  Social History Narrative   Lives alone   Caffeine- 1 c coffee   Social Drivers of Corporate investment banker Strain: Not on file  Food Insecurity: Not on file  Transportation Needs: Not on file  Physical Activity: Not on file  Stress: Not on file  Social Connections: Unknown (01/23/2022)   Received from [facility], [facility]   Social Network    Social Network: Not on file  Intimate Partner Violence: Unknown (12/15/2021)   Received from [facility], [facility]   HITS    Physically Hurt: Not on file    Insult or Talk Down To: Not on file    Threaten Physical Harm: Not on file    Scream or Curse: Not on file    Past Medical History, Surgical history, Social history, and Family history were reviewed and updated as appropriate.   Please see review of systems for further details on the patient's review from today.   Objective:   Physical Exam:  There were no vitals taken for this visit.  Physical Exam Constitutional:      General: She is not in acute distress.    Appearance: She is well-developed and normal weight.  Musculoskeletal:        General: No deformity.  Neurological:     Mental Status: She is alert and oriented to person, place, and time.     Cranial Nerves: No dysarthria.     Motor: Tremor present.     Coordination: Coordination normal.  Psychiatric:        Attention and Perception: Perception normal. She does not perceive auditory or visual hallucinations.        Mood and Affect:  Mood is not anxious or depressed. Affect is not labile, blunt, angry or tearful.        Speech: Speech normal. Speech is not rapid and pressured.        Behavior: Behavior normal. Behavior is cooperative.        Thought Content: Thought content is not paranoid or delusional. Thought content does not include homicidal or suicidal ideation. Thought content does not include suicidal plan.        Cognition and Memory: Cognition and memory normal.        Judgment: Judgment normal.     Comments: Good insight Dep managed Lately anxiety and obs thoughts are better   objectively the patient does not show significant mouth movements but she displayed video at prior visit in which typical mouth movements cheek movements associated with tardive dyskinesia was evident.  Her tremor is rhythmic and is not supportive of a tardive dyskinesia type tremor. Relatively mild facial movements at this time.  Lab Review:     Component Value Date/Time   NA 139 08/25/2020 1410   K 4.3 08/25/2020 1410   CL 103 08/25/2020 1410   CO2 22 08/25/2020 1410   GLUCOSE 85 08/25/2020 1410   BUN 11 08/25/2020 1410   CREATININE 0.93 08/25/2020 1410   CALCIUM 9.7 08/25/2020 1410   PROT 6.7 08/25/2020 1410   ALBUMIN 4.3 08/25/2020 1410   AST 22 08/25/2020 1410   ALT 16 08/25/2020 1410   ALKPHOS 69 08/25/2020 1410   BILITOT 0.5 08/25/2020 1410   GFRNONAA 59 (L) 08/25/2020 1410   GFRAA 69 08/25/2020 1410    No results found for: "WBC", "RBC", "HGB", "HCT", "PLT", "MCV", "MCH", "MCHC", "RDW", "LYMPHSABS", "MONOABS", "EOSABS", "BASOSABS"  No results found for: "POCLITH", "LITHIUM"   No  results found for: "PHENYTOIN", "PHENOBARB", "VALPROATE", "CBMZ"   She says lithium level was 0.6 at PCP on 450 mg daily.   07/2020 lithium low therapeutic by her report [facility]  Specifically her lithium level May 17, 2019 on this dosage of lithium was 0.3.  Her urinalysis was normal she had a normal CBC, comprehensive metabolic  panel was normal including creatinine 0.83 calcium 9.9 and TSH was normal at 2.83.  Lithium level in April 2019 was 0.2 at this dosage.  We will not increase the lithium because she is currently having tremor at the low dose.  .res Assessment: Plan:    Depression, major, recurrent, in partial remission (HCC)  Generalized anxiety disorder  Attention deficit hyperactivity disorder (ADHD), predominantly inattentive type  Tremor, essential  Low serum vitamin D  Lithium use  Tardive dyskinesia   30-minute face to face time with patient was spent on counseling and coordination of care. We discussed [place] has a long history over decades of cycling major depression and treatment resistance to meds noted above of nearly every antidepressant category.  Was more anxious and obsessive with mixed type sx until increase oxcarbazepine 450 mg HS.  Pt has had what she describes as mild hypomanic episodes in the past in response to antidepressants primarily.  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  Not been depressed lately with increase lamotrigine and less anxiety with Trileptal. Mood cycling worse with lithium reduction from 300 to 150 mg and better with 300 mg. While she does not identify herself is bipolar because her upswings seem only normal to her.  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 Usually November worse month of year.    Overall is much more stable with current med regimen than she has been in the past.  So no changes indicated.  Obsessions she made a mistake which will cause harm to others are better.  Lowest risk mood stabilizing med should be Trileptal.  Want to try to avoid risk of  triggering TD again if possible.   Benefit Trileptal for anxiety and somewhat for sleep even better with increase. Continue Trileptal to 450  mg HS for anxiety and insomnia.   Her best response so far has been from low-dose lithium with a stimulant but lamotrigine has seemed to help for 6 mos in 2020 until relapse in Summer.  The depression resolved again with the increase in lamotrigine from 100 to 150 mg daily.  They do not follow any pattern and do not appear to have any precipitant.   Cognitive complaints but not substantially impairing at this time.  Cognition is appropriate for age with no major concerns. Cerefolin NAC for a month with unclear effects. Rec trial Disc the off-label use of N-Acetylcysteine at 600 mg daily to help with mild cognitive problems.  It can be combined with a B-complex vitamin as the B-12 and folate have been shown to sometimes enhance the effect.  Gave her samples in the past. Option Namenda.  She'd rather not add more meds.  We tried an increase lithium to 600 mg daily and check a blood level.   The increase in lithium did not help her mood and her made her tremor worse. Counseled  patient regarding potential benefits, risks, and side effects of lithium to include potential risk of lithium affecting thyroid and renal function.  Discussed need for periodic lab monitoring to determine drug level and to assess for potential adverse effects.  Counseled patient regarding signs and symptoms of lithium toxicity and advised that they notify office immediately or seek urgent medical attention if experiencing these signs and symptoms.  Patient advised to contact office with any questions or concerns.    The tremor predates lithium and predates stimulant use but appears to have been made worse by the increase in lamotrigine.  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  She saw a neurologist. Does not really want to add meds for tremor.  the increase lamotrigine to 150  mg daily resolved the depression.  it was helpful for several months at 100. Consider reducing lamotrigine to see if obs thoughts get better.  Consider increasing for mood.  On lithium 300 mg nightly.  It appears she cannot tolerate a higher dose.  Consider reduction to reduce tremor possibly given improvement from lamotrigine. She wanted to try lower dose lithium 150 mg daily and this did not help tremor and mood cycling is worse, so increase lithium back to 300 mg daily.  Discussed potential benefits, risks, and side effects of stimulants with patient to include increased heart rate, palpitations, insomnia, increased anxiety, increased irritability, or decreased appetite.  Instructed patient to contact office if experiencing any significant tolerability issues. Pleased she got the [place] generic Ritalin bc easier to cut.  Ritalin  Prefers [place] generic. She got different brand [place] of methylphenidate and it shatters when tries to quarter it. She would like to try Concerta 54 mg Am  Continue  Vitamin D. Disc reasons for doing so for mental and physical health in detail.  She has stopped bc she hates taking pills.  Disc risk low D and dementia risk.  Supportive therapy dealing with GS's mental health problems.  Labs per PCP [facility].  No problems.  05/2023 level below normal range.  They are checking vitamin D levels. Low Vit D   lamotrigine 150 mg daily, lithium 300 mg nightly, Trileptal 450 mg nightly,   Switch Ritalin 20 mg 3 times daily to Concerta 54 to improve consistency  FU 4 mos  Meredith Staggers, MD, DFAPA '   Please see After Visit Summary for patient specific instructions.  Future Appointments  Date Time Provider Department Center  04/14/2024 10:00 AM [facility] DEXA 1 [facility]      No orders of the defined types were placed in this encounter.      -------------------------------

## 2023-10-08 DIAGNOSIS — Z23 Encounter for immunization: Secondary | ICD-10-CM | POA: Diagnosis not present

## 2023-10-25 ENCOUNTER — Telehealth: Payer: Self-pay | Admitting: Psychiatry

## 2023-10-25 NOTE — Telephone Encounter (Signed)
Patient lvm stating Dr Jennelle Human was to prescribe a new medication. A PA is needed maybe. She wasn't sure. She was checking on the status,stating the insurance has not received notification.

## 2023-10-25 NOTE — Telephone Encounter (Signed)
Looks like its concerta 54 mg. Phamracy said she picked it up 1/30 with a discount card but was unable to tell if it needed a pa.  LV 1/22  Switch Ritalin 20 mg 3 times daily to Concerta 54 to improve consistency

## 2023-10-28 NOTE — Telephone Encounter (Signed)
LF  Concerta 1/22, due 2/19

## 2023-10-28 NOTE — Telephone Encounter (Signed)
Delanie called back this morning 10/28/23. She hasn't gotten Concerta due to PA. She was thinking about it and doesn't want to take Concerta. Having some side effects. Doesn't feel it is working out and so she would like to switch back to Ritalin 20mg   3 x a day please. Send to the [facility] on [address]

## 2023-10-28 NOTE — Telephone Encounter (Signed)
Patient reporting GI pain, constipation, and upper body shaking since starting Concerta 54 mg. She has taken OTC for constipation without relief. 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 She wants to switch back to Ritalin.

## 2023-10-30 ENCOUNTER — Other Ambulatory Visit: Payer: Self-pay

## 2023-10-30 DIAGNOSIS — F3341 Major depressive disorder, recurrent, in partial remission: Secondary | ICD-10-CM

## 2023-10-30 DIAGNOSIS — F900 Attention-deficit hyperactivity disorder, predominantly inattentive type: Secondary | ICD-10-CM

## 2023-10-30 MED ORDER — METHYLPHENIDATE HCL 20 MG PO TABS: 20.0000 mg | ORAL_TABLET | Freq: Three times a day (TID) | ORAL | 0 refills | Status: DC

## 2023-10-30 NOTE — Telephone Encounter (Signed)
Addendum to previous message. Needs refill for Ritalin 20 mg 3/day sent to:  [facility] [id number] - [place], [place] - [address]   Phone: [phone]  Fax: [phone]

## 2023-10-30 NOTE — Telephone Encounter (Signed)
Her next appt is may, so pend 3 RX.  Like we discussed yesterday

## 2023-10-30 NOTE — Telephone Encounter (Signed)
 Pended.

## 2023-10-30 NOTE — Telephone Encounter (Signed)
Pended ritalin 20 mg to [facility].

## 2023-10-30 NOTE — Telephone Encounter (Signed)
Ritalin dose 20 mg TID

## 2023-11-24 ENCOUNTER — Other Ambulatory Visit: Payer: Self-pay | Admitting: Psychiatry

## 2023-11-24 DIAGNOSIS — F411 Generalized anxiety disorder: Secondary | ICD-10-CM

## 2023-11-24 DIAGNOSIS — F3341 Major depressive disorder, recurrent, in partial remission: Secondary | ICD-10-CM

## 2023-12-17 ENCOUNTER — Ambulatory Visit (INDEPENDENT_AMBULATORY_CARE_PROVIDER_SITE_OTHER): Payer: Medicare Other | Admitting: Radiology

## 2023-12-17 ENCOUNTER — Encounter: Payer: Self-pay | Admitting: Radiology

## 2023-12-17 VITALS — BP 132/76 | HR 70 | Ht 63.5 in | Wt 129.0 lb

## 2023-12-17 DIAGNOSIS — Z9189 Other specified personal risk factors, not elsewhere classified: Secondary | ICD-10-CM

## 2023-12-17 DIAGNOSIS — Z7251 High risk heterosexual behavior: Secondary | ICD-10-CM | POA: Diagnosis not present

## 2023-12-17 DIAGNOSIS — Z01419 Encounter for gynecological examination (general) (routine) without abnormal findings: Secondary | ICD-10-CM

## 2023-12-17 DIAGNOSIS — N952 Postmenopausal atrophic vaginitis: Secondary | ICD-10-CM

## 2023-12-17 NOTE — Progress Notes (Signed)
   Katrina Gamble Jan 27, 1941 [id number]   History: Postmenopausal 83 y.o. presents for annual exam. No gyn concerns.   Risk Factors for Medicare Patients >/= 5 sexual partners in a lifetime: No First intercourse <72 years of age: Yes H/O STD at any age: No Abnormal pap smear, < 3 negative paps within the last 7 years: No DES exposure (women born between [phone]): No Patient is on post breast cancer medication like Femara or, if medication like this is not needed, 5 years post breast cancer diagnosis: No   Gynecologic History Postmenopausal Last Pap: 2010. Results were: normal Last mammogram: 2024. Results were: normal Last colonoscopy: 2010 DEXA:2021- managed by PCP, has scheduled HRT use: no  Obstetric History OB History  Gravida Para Term Preterm AB Living  3 3 3   3   SAB IAB Ectopic Multiple Live Births      3    # Outcome Date GA Lbr Len/2nd Weight Sex Type Anes PTL Lv  3 Term           2 Term           1 Term                12/17/2023   10:31 AM  Depression screen PHQ 2/9  Decreased Interest 0  Down, Depressed, Hopeless 0  PHQ - 2 Score 0     The following portions of the patient's history were reviewed and updated as appropriate: allergies, current medications, past family history, past medical history, past social history, past surgical history, and problem list.  Review of Systems Pertinent items noted in HPI and remainder of comprehensive ROS otherwise negative.  Past medical history, past surgical history, family history and social history were all reviewed and documented in the EPIC chart.  Exam:  Vitals:   12/17/23 1028  BP: 132/76  Pulse: 70  SpO2: 98%  Weight: 129 lb (58.5 kg)  Height: 5' 3.5" (1.613 m)   Body mass index is 22.49 kg/m.  General appearance:  Normal Thyroid:  Symmetrical, normal in size, without palpable masses or nodularity. Respiratory  Auscultation:  Clear without wheezing or rhonchi Cardiovascular  Auscultation:   Regular rate, without rubs, murmurs or gallops  Edema/varicosities:  Not grossly evident Abdominal  Soft,nontender, without masses, guarding or rebound.  Liver/spleen:  No organomegaly noted  Hernia:  None appreciated  Skin  Inspection:  Grossly normal Breasts: Examined lying and sitting.   Right: Without masses, retractions, nipple discharge or axillary adenopathy.   Left: Without masses, retractions, nipple discharge or axillary adenopathy. Genitourinary   Inguinal/mons:  Normal without inguinal adenopathy  External genitalia:  Normal appearing vulva with no masses, tenderness, or lesions  BUS/Urethra/Skene's glands:  Normal  Vagina:  Normal appearing with normal color and discharge, no lesions. Atrophy: moderate   Cervix:  Normal appearing without discharge or lesions  Uterus:  Normal in size, shape and contour.  Midline and mobile, nontender  Adnexa/parametria:     Rt: Normal in size, without masses or tenderness.   Lt: Normal in size, without masses or tenderness.  Anus and perineum: Normal    Raynelle Fanning, CMA present for exam  Assessment/Plan:   1. Encounter for breast and pelvic examination (Primary)   Discussed SBE, colonoscopy and DEXA screening as directed. Recommend of exercise weekly, including weight bearing exercise. Encouraged the use of seatbelts and sunscreen.  Return in 1 year for annual or sooner prn.  Tanda Rockers WHNP-BC, 10:36 AM 12/17/2023

## 2023-12-20 ENCOUNTER — Other Ambulatory Visit: Payer: Self-pay | Admitting: Psychiatry

## 2023-12-20 DIAGNOSIS — F331 Major depressive disorder, recurrent, moderate: Secondary | ICD-10-CM

## 2024-01-30 ENCOUNTER — Ambulatory Visit (INDEPENDENT_AMBULATORY_CARE_PROVIDER_SITE_OTHER): Payer: Medicare Other | Admitting: Psychiatry

## 2024-01-30 ENCOUNTER — Encounter: Payer: Self-pay | Admitting: Psychiatry

## 2024-01-30 DIAGNOSIS — F331 Major depressive disorder, recurrent, moderate: Secondary | ICD-10-CM | POA: Diagnosis not present

## 2024-01-30 DIAGNOSIS — F900 Attention-deficit hyperactivity disorder, predominantly inattentive type: Secondary | ICD-10-CM

## 2024-01-30 DIAGNOSIS — F411 Generalized anxiety disorder: Secondary | ICD-10-CM

## 2024-01-30 DIAGNOSIS — F3341 Major depressive disorder, recurrent, in partial remission: Secondary | ICD-10-CM

## 2024-01-30 MED ORDER — METHYLPHENIDATE HCL 20 MG PO TABS: 20.0000 mg | ORAL_TABLET | Freq: Three times a day (TID) | ORAL | 0 refills | Status: DC

## 2024-01-30 MED ORDER — OXCARBAZEPINE 150 MG PO TABS: 450.0000 mg | ORAL_TABLET | Freq: Every day | ORAL | 1 refills | Status: DC

## 2024-01-30 MED ORDER — LAMOTRIGINE 150 MG PO TABS: 150.0000 mg | ORAL_TABLET | Freq: Every day | ORAL | 1 refills | Status: DC

## 2024-01-30 MED ORDER — LITHIUM CARBONATE 300 MG PO CAPS: 300.0000 mg | ORAL_CAPSULE | Freq: Every day | ORAL | 1 refills | Status: DC

## 2024-01-30 NOTE — Progress Notes (Signed)
 Katrina Gamble [id number] 01-02-1941 83 y.o.     Subjective:   Patient ID:  Katrina Gamble is a 83 y.o. (DOB 09/22/1940) female.  Chief Complaint:  Chief Complaint  Patient presents with   Follow-up   Depression   Manic Behavior   ADD    Katrina Gamble presents today for follow-up of ADD and mood.  When t seen May 15, 2019.  She continued to have cycles of depression without clear precipitant.  It was suggested that she increase lithium  to 450 mg daily check a lithium  level.  seen July 15, 2019.  She was having more depression.  The following changes were made. Rec increase lithium  to 600 mg daily for a week and check blood level. If this fails to help or is intolerable then recommend lamotrigine  trial as noted. She continued Ritalin  20 mg 3 times daily per usual  No lithium  level nor BMP were received.  seen August 20 2019.  She was still dealing with depression and had not seen any improvement in depression at 600 mg versus 450 mg daily.  She did not get a blood level. Reduce lithium  to 450 mg daily DT NR at 600 daily  recommend lamotrigine  trial as noted.  She called back December 30 stating she had been on lamotrigine  for 2 weeks and was noticing a tremor and wondered if it was related.  BC of tremor she went down again from 450 mg in lithium  and had reduced it to 300 mg instead of 450 mg.  seen October 15, 2019.  The following was noted: CO  Worsening tremor intentional affecting mouse use about December 30.  Wonders about essential tremor vs TD.   Thyroid  checked a couple months ago and dose was not changed.  Tremor in head interfered with dental procedures some.  Alcohol does not help tremor and  Never has.   Essential tremor dx decades ago. Mood is better with lamotrigine  100 mg daily.  Mood more level.   Plan: No meds were changed.  She continued the following: Continue lamotrigine  100 mg daily as it was helpful. Continue Ritalin  as  prescribed. Continue lithium  300 mg nightly.  So far her mood is been stable with a lower dose Not using much propranolol .  01/12/2020 appointment, the following is noted: Questions about the propranolol .  Still has sig tremor that varies in intensity from mild to severe.  Not always bothered by it unless doing things with fine motor control like writing.  Has taken up to 20 mg propranolol  and not more bc ? GI SE.   Mood still good.  Dep controlled.  Anxiety hard to tell bc D with recurrent breast CA. Plan no meds changed.  04/13/2020 appointment with the following noted: Propranolol  with limited effect on tremor even up to 50 mg daily.  Hard time writing. Worse with guilty, anxious depressed thoughts.  Neg self talk.  Not sure if anxiety triggered neg thinking.  No change stress or meds.  Sleep is disjointed.  Plan: Increase lamotrigine  to 150 mg daily as it was helpful for several months at 100. Continue Ritalin  as prescribed. Continue lithium  300 mg nightly.   05/25/2020 appointment with the following noted: After increased lamotrigine  to mood overall has been very good with mood.  Bad part is tremor has gotten much worse with intention.  Wonders about neurologist. No med changes  08/24/2020 appointment with the following noted:  Disc lithium  and kidney function and questions about CKD 3. Missed  neuro appt bc got sick right before it.  RS for FEB.  But opening for tomorrow.  Tremor is increasing . Insomnia is worse.  Trouble going to sleep for a couple of hours.  3 nights per week. Racing thoughts all over the place.  Some are regretful thoughts from the past but random things.  Don't have the thoughts in the day. No naps. Cut caffeine max 2 cups coffee as late as 11 AM. Ritalin  as late as 630 pm and usually that won't bother sleep.  Kids think her ADD meds aren't working well.  Scattered and hard to stay on task.   Not hyper spending or otherwise hyper.  Always tendency to talk if someone  to listen. Can internally feel tense.  Not irritable. Doesn't want more meds. Pretty good with depression. Plan: The increase lamotrigine  to 150 mg daily resolved the depression.  it was helpful for several months at 100. Continue Ritalin  as prescribed 3/4 tablet QID.  Prefers Malincroft generic. She got different brand Accord of methylphenidate  and it shatters when tries to quarter it. Continue lithium  300 mg nightly.  Agree with her plans to see neurology.  11/22/2020 appointment with following noted: Neurology diagnosed essential tremor with possible exacerbation by lithium  and anxiety and suggested at the moment to withhold treatment for fear of worsening her balance and because the tremor was rather mild on exam.  MRI showed age-appropriate moderate chronic white matter disease. Pt doesn't think her tremor is mild as did neuro.  Worse with writing.  Disc neuro consult.  Disc propranolol  dosing and risk of depression at higher dose.  Tremor noticeable in AM before coffee and affects writing and typing. Depression is under control.  Anxiety a little worse and experienced physically.  If get anxious then it builds on itself. Denies appetite disturbance.  Patient reports that energy and motivation have been good.  Patient denies any difficulty with concentration. Patient denies any suicidal ideation. Plan: Retry propranolol  per her request 40 mg twice daily for a few days and if no benefit then increase to 50 mg twice daily for a few days. Can increase every few days by 10 mg to a max of 80 mg twice daily as long as there are no side effects and you monitor blood pressure and pulse without significant changes or problems. The increase lamotrigine  to 150 mg daily resolved the depression.  it was helpful for several months at 100. Continue Ritalin  as prescribed 3/4 tablet QID.  Prefers Malincroft generic. She got different brand Accord of methylphenidate  and it shatters when tries to quarter  it. Continue lithium  300 mg nightly.   01/25/2021 appointment with the following noted: Difficult time adjusting to and fearful of changing meds.  Had number of stressors.   Since here # stressors.  GS rehab alcohol and bipolar disorder.   Recognizes pattern of avoidance in her life.  Plans to still try the propranolol  bc the tremor is better. Patient reports stable mood and denies depressed or irritable moods.   Patient denies difficulty with sleep initiation or maintenance. Denies appetite disturbance.  Patient reports that energy and motivation have been good.  Patient denies any difficulty with concentration.  Patient denies any suicidal ideation.  Consistent with meds. Pllan: Retry propranolol  when she feel ready per her request 40 mg twice daily for a few days and if no benefit then increase to 50 mg twice daily for a few days. Can increase every few days by 10 mg to a  max of 80 mg twice daily as long as there are no side effects and you monitor blood pressure and pulse without significant changes or problems. It can help both tremor and anxiety. The increase lamotrigine  to 150 mg daily resolved the depression.  it was helpful for several months at 100. Continue Ritalin  as prescribed 3/4 tablet QID.  Prefers Malincroft generic. She got different brand Accord of methylphenidate  and it Engineer, water when tries to quarter it. Continue lithium  300 mg nightly.   05/11/2021 appointment with the following noted: Continues lamotrigine  150 mg daily, lithium  300 mg daily, Ritalin  20 mg 3 times daily.  Has not been taking propranolol  which was prescribed last visit for tremor and anxiety.  She never tried it.  She didn't feel it was bad enough to warrant treatment.  Tremor affects use of mouse.  Affects writing.   Has recurrence of obsessive thinking and trouble sleeping for a couple of mos.  Obs thoughts on things she's done or not done and that she's responsible for something bad that might happen..  Obs  interfere with sleep and are worse.  Last night after 330 before asleep and awake 730.  Never nap and not really tired in the daytime.   Periods of several days in a row of Energizer bunny and then resolves. Plan: Start Trileptal  150 mg HS for 5 days then 300 mg HS.  If not better in a couple of weeks call.  07/10/2021 appointment with the following noted: Disc BP this am.  Not checking it at home.  Is having some back and leg pain today which might increase BP. Really helped anxiety with Trileptal  300 mg nightly.  Choose to go to sleep late.   Avg 4-7 hours sleep. Patient reports stable mood and denies depressed or irritable moods.  Patient denies any recent difficulty with anxiety.  Patient denies difficulty with sleep initiation or maintenance. Denies appetite disturbance.  Patient reports that energy and motivation have been good.  Patient denies any difficulty with concentration.  Patient denies any suicidal ideation. SE always kind of clutsy but may be a little more.  No balance issues in the day. Plan: The increase lamotrigine  to 150 mg daily resolved the depression.  it was helpful for several months at 100. Continue Trileptal  300 mg nightly Continue lithium  300 mg nightly Continue Ritalin  20 mg tablets 3/4 to 1 tablet 3 times daily  10/09/2021 appointment with the following noted: 3 issues.  Main concerns are cognitive and tremor. 1 is cognitive seems atypical like with numbers like figuring years.  Computation.   More trouble folding fitted sheets.  Thinks it is worse over 3-6 mos.   No sig trouble paying bills or doing checking account.  Pays online Has to reread recipes over longer period of time. Not as motivated for self care and getting out of the house.  Not markedly depressed otherwise. Anxiety is still better with Trileptal , but will tend to worry over somatic concerns and think the worst. Sleep is pretty good and sleeping later than usual. Trmor is worse.  Neuro was somewhat  dismissive of tremor.  Remote dx of essential tremor Lithium  is successful for mood. Plan; no med changes except reduced lithium  to 150 mg daily to see if tremor would be better.  Continue lamotrigine  150, Ritalin  20 mg tablets 3/4 tablets 3 times daily, oxcarbazepine  300 mg nightly,   12/07/21 appt noted: Tremor no better with less lithium . More mood cycling from depression to highs with energizer  bunny to anxiety with sense of doom.   Periods of flashing or intrusive thoughts she had done something wrong to new great grand child. Still prefers Malincroft generic Ritalin . Plan: She wanted to try lower dose lithium  150 mg daily and this did not help tremor and mood cycling is worse, so increase lithium  bak to300 mg daily.  03/05/2022 appointment with the following noted: Continues lamotrigine  150 mg daily, lithium  300 mg daily, oxcarbazepine  300 mg nightly, Ritalin  20 mg 3 times daily Last still having obsessive intrusive thoughts without reason.  She thinks it's related  to meds but reviewed med sequencing of changes do not provide an obvious med pcpt.   Obsessions primarily about doing something wrong that may harm others.  Can cause sleeplessness and panic feelings. Having insomnia initially with anxious thoughts.    No depression periods.  No hyperactive periods. Tremor seems worse affecting  typing. No PCP now.   Plan: increase oxcarb 450 HS  05/24/2022 appointment noted: Continues lamotrigine  150 mg daily, lithium  300 mg nightly, Trileptal  450 mg nightly, Ritalin  20 mg 3 times daily. Better with increase oxcarbazepine  450 mg HS with anxiety and insomnia. Last couple of weeks exceptionally organized and motivated.   Sleep is good but tendency to stay up late.  No racing thoughts at night.  No reckless impulsive behavior. No depression. Anxiety is better with oxcarb No sig SE. To [place] to visit lifelong friend next week. Plan no med changes  08/23/22 appt noted: No med changes:  lamotrigine  150 mg daily, lithium  300 mg nightly, Trileptal  450 mg nightly, Ritalin  20 mg 3 times daily. Doing OK.  Overall fairly stable.  Holiday mode.  Indecisiveness and procrastinates at this time of year.   She has 25 people at her house at Christmas.   Sleep is good overall without further insomnia.  No mania.   Depression is under control.  Usually has it in November but not this year.   No sig SE. PCP referred her to Dr. Winferd Hatter at her request and they wouldn't accept her bc already seen another neuro.  Tremor has gotten worse with more trouble using the mouse.  She is aware of essential tremor weighted glove and gets the newsletter.  Functional.  11/22/22 appt noted: No med changes.   lamotrigine  150 mg daily, lithium  300 mg nightly, Trileptal  450 mg nightly, Ritalin  20 mg 3 times daily. Not as good since here.  Trouble organizing for her taxes ? ADD vs cognitive issues.  Loses things not new but more so.  Not getting lost driving.  Not leaving pots on stove.   Sometimes won't leave house for days at a time.  More than normal for her. ? Depression.  More trouble with motivation at home except periods of 3-4  days of mood swing into hypomania and will do things for a couple of days. Had FU neuro.    02/25/23 note:  No med changes Problems with hourse leak.  Asbestos.  Ongoing repair.  Not great at decision making.   Dep remains manageable.  No problems with meds.   Normal labs with PCP and expected subtherapeutic lithium  as expected .  Lithium  is helping at low dose. Still benefit Ritalin .  Notice absence more than when takes it. Plan: No med changes: lamotrigine  150 mg daily, lithium  300 mg nightly, Trileptal  450 mg nightly, Ritalin  20 mg 3 times daily.  06/27/23 appt noted: State of constant turmoil with problems in the house.  Leaks caused damage.  Needed plumbing.   Still on meds.  No sig SE.  Meds seem ok and no sig change.   If misses Ritalin  can't move or think. Stress of getting  house fixed has caused some anxiety but as expected.   Plan no changes  10/02/23 appt noted: Meds as above Big plumbing issues at home ongoing and  living in 1 room at a time for 6 mos.  Also found asbestos in the home.  Kind of getting back to normal.   Makes me sad at times.  Normal reaction to what she's dealing with.  Pretty even keel otherwise.   No SE.   Consistent with meds.   Patient reports stable mood and denies depressed or irritable moods.  Patient denies any recent difficulty with anxiety.  Patient denies difficulty with sleep initiation or maintenance. Denies appetite disturbance.  Patient reports that energy and motivation have been good.  Patient denies any difficulty with concentration.  Patient denies any suicidal ideation. No major cognitive concerns but some frustrations with mild forgetfulness.   Some frustrations with short acting aspects of Ritalin .  01/30/24 appt noted:  Med: Ritalin  20 TID, oxcarb 450 HS, lamotrigine  150, lithium  300 mg daily.  When through period 5 days hypomania without trigger then dropped into dep.  Hypomania more intense energetic, motivated, productive.  No excess spending.  Now coming out.  Doesn't like the cycle.  Sleep is good.   Consciously working on completing tasks with good feeling. No SE.  Holter monitor occ SVT and rare VT but not requiring treatment   H died 4 years ago  Youngest D relapse breast CA.  Stressful.  New great grand D [place].  Psych med history:  Abilify no response,  Mellaril,  Refuses Seroquel bc F problems with it.  Nardil hyper and goofy, Prozac with side effects, try cyclic antidepressants, Viibryd, Trintellix caused anxiety,  Wellbutrin which caused tremors and goofy which Lexapro toned down,  imipramine, Pristiq 100,  Vyvanse, Ritalin , history of Dexedrine during pregnancy which caused weight gain,  Cerefolin NAC,   lithium  600 tremor worse (started lithium  150 mg Nov 2018) We tried an increase lithium  to  600 mg daily and check a blood level.   The increase in lithium  did not help her mood and her made her tremor worse.  Lamotrigine  150 Trileptal  450 helped anxiety Propranolol  50 without help for tremor  Remote hosp for obs on dangerousness irrationally about water in a vase. and panic at [facility] and was on Nardil at the time. F took Seroquel for hallucinations with PD and associated dementia and SE  Review of Systems:  Review of Systems  Constitutional:  Negative for fatigue.  Cardiovascular:  Negative for chest pain and palpitations.  Gastrointestinal:  Negative for nausea.  Musculoskeletal:  Positive for arthralgias.       Knee pain ongoing  Neurological:  Positive for tremors. Negative for dizziness.       Tremor worse at lithium  600 mg daily versus lithium  450 mg daily  Psychiatric/Behavioral:  Positive for sleep disturbance. Negative for agitation, behavioral problems, confusion, decreased concentration, dysphoric mood, hallucinations, self-injury and suicidal ideas. The patient is nervous/anxious. The patient is not hyperactive.     Medications: I have reviewed the patient's current medications.  Current Outpatient Medications  Medication Sig Dispense Refill   Cetirizine HCl (ZYRTEC ALLERGY) 10 MG CAPS 1 capsule     levothyroxine (SYNTHROID) 50 MCG tablet Take 50 mcg by mouth daily.  VITAMIN D  PO Take 5,000 Units by mouth daily.     lamoTRIgine  (LAMICTAL ) 150 MG tablet Take 1 tablet (150 mg total) by mouth daily. 90 tablet 1   lithium  carbonate 300 MG capsule Take 1 capsule (300 mg total) by mouth daily. 90 capsule 1   methylphenidate  (RITALIN ) 20 MG tablet Take 1 tablet (20 mg total) by mouth 3 (three) times daily with meals. 90 tablet 0   [START ON 02/27/2024] methylphenidate  (RITALIN ) 20 MG tablet Take 1 tablet (20 mg total) by mouth 3 (three) times daily with meals. 90 tablet 0   [START ON 03/26/2024] methylphenidate  (RITALIN ) 20 MG tablet Take 1 tablet (20 mg  total) by mouth 3 (three) times daily with meals. 90 tablet 0   OXcarbazepine  (TRILEPTAL ) 150 MG tablet Take 3 tablets (450 mg total) by mouth at bedtime. 180 tablet 1   No current facility-administered medications for this visit.    Medication Side Effects: tremor is affecting handwriting and computer worse with 600 mg lithium ..  Numbness in toes.  Hands and feet always cold.  Allergies:  Allergies  Allergen Reactions   Codeine Other (See Comments)    Abdominal pain  Other reaction(s): stomach upset   Latex Hives    Other reaction(s): clammy, "feeling horrible"   Septra [Sulfamethoxazole-Trimethoprim] Rash   Sulfa Antibiotics Rash and Other (See Comments)    Other reaction(s): rash    Past Medical History:  Diagnosis Date   ADD (attention deficit disorder)    Basal cell carcinoma    Depression    Diverticulitis    Hypothyroid    d/t lithium    Osteopenia 11/2017   T score -1.1 FRAX 24% / 14%   Peripheral neuropathy    Tremor, essential    Vitamin D  deficiency 12/2017    Family History  Problem Relation Age of Onset   Hypertension Mother    Thyroid  disease Mother    Breast cancer Mother 72   Other Father        Brain tumor   Parkinson's disease Father    Dementia Father    Pneumonia Father    Breast cancer Daughter 40       recur x 3 (4 times - total)   Thyroid  disease Daughter    Cancer Daughter        Melanoma   Leukemia Maternal Grandmother    Stomach cancer Paternal Grandmother    Heart disease Paternal Grandfather    Tremor Neg Hx     Social History   Socioeconomic History   Marital status: Widowed    Spouse name: Not on file   Number of children: 3   Years of education: Not on file   Highest education level: Bachelor's degree (e.g., BA, AB, BS)  Occupational History    Comment: retired  Tobacco Use   Smoking status: Former    Current packs/day: 0.00    Average packs/day: 0.5 packs/day for 30.0 years (15.0 ttl pk-yrs)    Types: Cigarettes     Start date: 39    Quit date: 2012    Years since quitting: 13.3    Passive exposure: Never   Smokeless tobacco: Never  Vaping Use   Vaping status: Never Used  Substance and Sexual Activity   Alcohol use: Yes    Alcohol/week: 10.0 standard drinks of alcohol    Types: 10 Glasses of wine per week   Drug use: Never   Sexual activity: Not Currently    Partners: Male  Birth control/protection: Post-menopausal    Comment: 1st intercourse 83 yo-Fewer than 5 partners  Other Topics Concern   Not on file  Social History Narrative   Lives alone   Caffeine- 1 c coffee   Social Drivers of Corporate investment banker Strain: Not on file  Food Insecurity: Not on file  Transportation Needs: Not on file  Physical Activity: Not on file  Stress: Not on file  Social Connections: Unknown (01/23/2022)   [facility]   Social Network    Social Network: Not on file  Intimate Partner Violence: Unknown (12/15/2021)   Received from [facility], [facility]   HITS    Physically Hurt: Not on file    Insult or Talk Down To: Not on file    Threaten Physical Harm: Not on file    Scream or Curse: Not on file    Past Medical History, Surgical history, Social history, and Family history were reviewed and updated as appropriate.   Please see review of systems for further details on the patient's review from today.   Objective:   Physical Exam:  There were no vitals taken for this visit.  Physical Exam Constitutional:      General: She is not in acute distress.    Appearance: She is well-developed and normal weight.  Musculoskeletal:        General: No deformity.  Neurological:     Mental Status: She is alert and oriented to person, place, and time.     Cranial Nerves: No dysarthria.     Motor: Tremor present.     Coordination: Coordination normal.  Psychiatric:        Attention and Perception: Perception normal. She does not perceive auditory or visual  hallucinations.        Mood and Affect: Mood is not anxious or depressed. Affect is not labile, blunt, angry or tearful.        Speech: Speech normal. Speech is not rapid and pressured.        Behavior: Behavior normal. Behavior is cooperative.        Thought Content: Thought content is not paranoid or delusional. Thought content does not include homicidal or suicidal ideation. Thought content does not include suicidal plan.        Cognition and Memory: Cognition and memory normal.        Judgment: Judgment normal.     Comments: Good insight Dep managed.  Recent cycling. Lately anxiety and obs thoughts are better   objectively the patient does not show significant mouth movements but she displayed video at prior visit in which typical mouth movements cheek movements associated with tardive dyskinesia was evident.  Her tremor is rhythmic and is not supportive of a tardive dyskinesia type tremor. Relatively mild facial movements at this time.  Lab Review:     Component Value Date/Time   NA 139 08/25/2020 1410   K 4.3 08/25/2020 1410   CL 103 08/25/2020 1410   CO2 22 08/25/2020 1410   GLUCOSE 85 08/25/2020 1410   BUN 11 08/25/2020 1410   CREATININE 0.93 08/25/2020 1410   CALCIUM 9.7 08/25/2020 1410   PROT 6.7 08/25/2020 1410   ALBUMIN 4.3 08/25/2020 1410   AST 22 08/25/2020 1410   ALT 16 08/25/2020 1410   ALKPHOS 69 08/25/2020 1410   BILITOT 0.5 08/25/2020 1410   GFRNONAA 59 (L) 08/25/2020 1410   GFRAA 69 08/25/2020 1410    No results found for: "  WBC", "RBC", "HGB", "HCT", "PLT", "MCV", "MCH", "MCHC", "RDW", "LYMPHSABS", "MONOABS", "EOSABS", "BASOSABS"  No results found for: "POCLITH", "LITHIUM "   No results found for: "PHENYTOIN", "PHENOBARB", "VALPROATE", "CBMZ"   She says lithium  level was 0.6 at PCP on 450 mg daily.   07/2020 lithium  low therapeutic by her report [facility]  Specifically her lithium  level May 17, 2019 on this dosage of lithium  was 0.3.  Her urinalysis  was normal she had a normal CBC, comprehensive metabolic panel was normal including creatinine 0.83 calcium 9.9 and TSH was normal at 2.83.  Lithium  level in April 2019 was 0.2 at this dosage.  We will not increase the lithium  because she is currently having tremor at the low dose.  .res Assessment: Plan:    Depression, major, recurrent, in partial remission (HCC) - Plan: OXcarbazepine  (TRILEPTAL ) 150 MG tablet, methylphenidate  (RITALIN ) 20 MG tablet, lamoTRIgine  (LAMICTAL ) 150 MG tablet  Generalized anxiety disorder - Plan: OXcarbazepine  (TRILEPTAL ) 150 MG tablet  Attention deficit hyperactivity disorder (ADHD), predominantly inattentive type - Plan: methylphenidate  (RITALIN ) 20 MG tablet, methylphenidate  (RITALIN ) 20 MG tablet, methylphenidate  (RITALIN ) 20 MG tablet  Major depressive disorder, recurrent episode, moderate (HCC) - Plan: lithium  carbonate 300 MG capsule   30-minute face to face time with patient was spent on counseling and coordination of care. 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  Was more anxious and obsessive with mixed type sx until increase oxcarbazepine  450 mg HS.  Pt has had what she describes as mild hypomanic episodes in the past in response to antidepressants primarily.  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  Not been depressed lately with increase lamotrigine  and less anxiety with Trileptal . Mood cycling worse with lithium  reduction from 300 to 150 mg and better with 300 mg. While she does not identify herself is bipolar because her upswings seem only normal to her.  The cycling nature of her depression is more consistent with a bipolar type depression than it is with major depression which does not typically occur with intermittent periods of several days of feeling euthymic  and then back into depression again which is her pattern. Usually November worse month of year.    Overall is much more stable with current med regimen than she has been in the past.  So no changes indicated.  Obsessions she made a mistake which will cause harm to others are better.  Lowest risk mood stabilizing med should be Trileptal .  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   Benefit Trileptal  for anxiety and somewhat for sleep even better with increase.option increase for mild mood cycling that led into 3 weed dep.  She doesn't want to change. Continue Trileptal  to 450  mg HS for anxiety and insomnia.   Her best response so far has been from low-dose lithium  with a stimulant but lamotrigine  has seemed to help for 6 mos in 2020 until relapse in Summer.  The depression resolved again with the increase in lamotrigine  from 100 to 150 mg daily.  They do not follow any pattern and do not appear to have any precipitant.   Cognitive complaints but not substantially impairing at this time.  Cognition is appropriate for age with no major concerns. Cerefolin NAC for a month with unclear effects. Rec trial Disc the off-label use of  N-Acetylcysteine at 600 mg daily to help with mild cognitive problems.  It can be combined with a B-complex vitamin as the B-12 and folate have been shown to sometimes enhance the effect.  Gave her samples in the past. Option Namenda.  She'd rather not add more meds.  We tried an increase lithium  to 600 mg daily and check a blood level.   The increase in lithium  did not help her mood and her made her tremor worse. Counseled patient regarding potential benefits, risks, and side effects of lithium  to include potential risk of lithium  affecting thyroid  and renal function.  Discussed need for periodic lab monitoring to determine drug level and to assess for potential adverse effects.  Counseled patient regarding signs and symptoms of lithium  toxicity and  advised that they notify office immediately or seek urgent medical attention if experiencing these signs and symptoms.  Patient advised to contact office with any questions or concerns.    The tremor predates lithium  and predates stimulant use but appears to have been made worse by the increase in lamotrigine .  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  She saw a neurologist. Does not really want to add meds for tremor.  the increase lamotrigine  to 150 mg daily resolved the depression.  it was helpful for several months at 100. Consider reducing lamotrigine  to see if obs thoughts get better.  Consider increasing for mood.  On lithium  300 mg nightly.  It appears she cannot tolerate a higher dose.  Consider reduction to reduce tremor possibly given improvement from lamotrigine . She wanted to try lower dose lithium  150 mg daily and this did not help tremor and mood cycling is worse, so increase lithium  back to 300 mg daily.  Discussed potential benefits, risks, and side effects of stimulants with patient to include increased heart rate, palpitations, insomnia, increased anxiety, increased irritability, or decreased appetite.  Instructed patient to contact office if experiencing any significant tolerability issues. Pleased she got the [place] generic Ritalin  bc easier to cut.  Ritalin   Prefers [place] generic. She got different brand [facility] of methylphenidate  and it shatters when tries to quarter it. She would like to try Concerta  54 mg Am  Continue  Vitamin D . Disc reasons for doing so for mental and physical health in detail.  She has stopped bc she hates taking pills.  Disc risk low D and dementia risk.  Supportive therapy dealing with GS's mental health problems.  Labs per PCP [facility].  No problems.  05/2023 level below normal range.  They are checking vitamin D  levels. Low Vit D   lamotrigine  150 mg daily, lithium  300 mg nightly, Trileptal  450 mg nightly,   Switch Ritalin  20 mg  3 times daily to Concerta  54 to improve consistency  FU 4 mos  Nori Beat, MD, DFAPA '   Please see After Visit Summary for patient specific instructions.  Future Appointments  Date Time Provider Department Center  04/14/2024 10:00 AM [facility] DX DEXA 1 [facility]  06/01/2024  9:00 AM Cottle, Kennedy Peabody., MD [facility] None  12/17/2024  9:00 AM Chrzanowski, Jami B, NP [facility] None      No orders of the defined types were placed in this encounter.      -------------------------------

## 2024-03-04 DIAGNOSIS — E538 Deficiency of other specified B group vitamins: Secondary | ICD-10-CM | POA: Diagnosis not present

## 2024-03-04 DIAGNOSIS — F339 Major depressive disorder, recurrent, unspecified: Secondary | ICD-10-CM | POA: Diagnosis not present

## 2024-03-04 DIAGNOSIS — E559 Vitamin D deficiency, unspecified: Secondary | ICD-10-CM | POA: Diagnosis not present

## 2024-03-04 DIAGNOSIS — Z79899 Other long term (current) drug therapy: Secondary | ICD-10-CM | POA: Diagnosis not present

## 2024-03-04 DIAGNOSIS — E039 Hypothyroidism, unspecified: Secondary | ICD-10-CM | POA: Diagnosis not present

## 2024-03-05 ENCOUNTER — Encounter: Payer: Self-pay | Admitting: Psychiatry

## 2024-04-14 ENCOUNTER — Other Ambulatory Visit: Payer: Medicare Other

## 2024-04-22 DIAGNOSIS — H35373 Puckering of macula, bilateral: Secondary | ICD-10-CM | POA: Diagnosis not present

## 2024-04-28 ENCOUNTER — Telehealth: Payer: Self-pay | Admitting: Psychiatry

## 2024-04-28 ENCOUNTER — Other Ambulatory Visit: Payer: Self-pay

## 2024-04-28 DIAGNOSIS — F3341 Major depressive disorder, recurrent, in partial remission: Secondary | ICD-10-CM

## 2024-04-28 DIAGNOSIS — F900 Attention-deficit hyperactivity disorder, predominantly inattentive type: Secondary | ICD-10-CM

## 2024-04-28 MED ORDER — METHYLPHENIDATE HCL 20 MG PO TABS: 20.0000 mg | ORAL_TABLET | Freq: Three times a day (TID) | ORAL | 0 refills | Status: DC

## 2024-04-28 NOTE — Telephone Encounter (Signed)
 Pended

## 2024-04-28 NOTE — Telephone Encounter (Signed)
 Patient called in for refill on Methylphenidate  20mg . Ph: [phone] Appt 9/22 [facility] [address] [place]

## 2024-05-01 ENCOUNTER — Ambulatory Visit: Payer: Self-pay | Admitting: Psychiatry

## 2024-05-01 NOTE — Progress Notes (Signed)
 Lithium  level 0.5. Normal CBC, CMP. Vit d 85.  Suggest taking vitamin D  supplement

## 2024-05-22 DIAGNOSIS — H26492 Other secondary cataract, left eye: Secondary | ICD-10-CM | POA: Diagnosis not present

## 2024-05-22 DIAGNOSIS — H18593 Other hereditary corneal dystrophies, bilateral: Secondary | ICD-10-CM | POA: Diagnosis not present

## 2024-05-22 DIAGNOSIS — H18413 Arcus senilis, bilateral: Secondary | ICD-10-CM | POA: Diagnosis not present

## 2024-05-22 DIAGNOSIS — Z961 Presence of intraocular lens: Secondary | ICD-10-CM | POA: Diagnosis not present

## 2024-05-28 ENCOUNTER — Ambulatory Visit (HOSPITAL_BASED_OUTPATIENT_CLINIC_OR_DEPARTMENT_OTHER)
Admission: RE | Admit: 2024-05-28 | Discharge: 2024-05-28 | Disposition: A | Discharge status: Home / Self Care | Source: Ambulatory Visit | Attending: Family Medicine | Admitting: Family Medicine

## 2024-05-28 DIAGNOSIS — E2839 Other primary ovarian failure: Secondary | ICD-10-CM | POA: Insufficient documentation

## 2024-05-28 DIAGNOSIS — Z780 Asymptomatic menopausal state: Secondary | ICD-10-CM | POA: Diagnosis not present

## 2024-06-01 ENCOUNTER — Ambulatory Visit: Admitting: Psychiatry

## 2024-06-01 ENCOUNTER — Telehealth: Payer: Self-pay | Admitting: Psychiatry

## 2024-06-01 NOTE — Telephone Encounter (Signed)
 Had wrong time for appt today. Need rf of Trilaptal    Next appt 11/18   [facility] [address]

## 2024-06-01 NOTE — Progress Notes (Signed)
 No show

## 2024-06-01 NOTE — Telephone Encounter (Signed)
 LVM per DPR that she has RF available, confirmed with pharmacy.

## 2024-06-22 DIAGNOSIS — R399 Unspecified symptoms and signs involving the genitourinary system: Secondary | ICD-10-CM | POA: Diagnosis not present

## 2024-06-29 ENCOUNTER — Other Ambulatory Visit: Payer: Self-pay

## 2024-06-29 ENCOUNTER — Telehealth: Payer: Self-pay | Admitting: Psychiatry

## 2024-06-29 DIAGNOSIS — F900 Attention-deficit hyperactivity disorder, predominantly inattentive type: Secondary | ICD-10-CM

## 2024-06-29 MED ORDER — METHYLPHENIDATE HCL 20 MG PO TABS
20.0000 mg | ORAL_TABLET | Freq: Three times a day (TID) | ORAL | 0 refills | Status: DC
Start: 2024-06-29 — End: 2024-07-28

## 2024-06-29 NOTE — Telephone Encounter (Signed)
 Pended

## 2024-06-29 NOTE — Telephone Encounter (Signed)
 Pt called at 9:48a requesting refill of Methylphenidate  to   [id number] - [place], [place] - [address]  [place] [address] Phone: [phone]  Fax: [phone]    Next appt 11/18

## 2024-07-17 ENCOUNTER — Other Ambulatory Visit: Payer: Self-pay | Admitting: Family Medicine

## 2024-07-17 DIAGNOSIS — Z1231 Encounter for screening mammogram for malignant neoplasm of breast: Secondary | ICD-10-CM

## 2024-07-23 DIAGNOSIS — I700 Atherosclerosis of aorta: Secondary | ICD-10-CM | POA: Diagnosis not present

## 2024-07-23 DIAGNOSIS — M25511 Pain in right shoulder: Secondary | ICD-10-CM | POA: Diagnosis not present

## 2024-07-23 DIAGNOSIS — Z23 Encounter for immunization: Secondary | ICD-10-CM | POA: Diagnosis not present

## 2024-07-23 DIAGNOSIS — M19011 Primary osteoarthritis, right shoulder: Secondary | ICD-10-CM | POA: Diagnosis not present

## 2024-07-28 ENCOUNTER — Ambulatory Visit (INDEPENDENT_AMBULATORY_CARE_PROVIDER_SITE_OTHER): Admitting: Psychiatry

## 2024-07-28 ENCOUNTER — Encounter: Payer: Self-pay | Admitting: Psychiatry

## 2024-07-28 DIAGNOSIS — G250 Essential tremor: Secondary | ICD-10-CM | POA: Diagnosis not present

## 2024-07-28 DIAGNOSIS — G2401 Drug induced subacute dyskinesia: Secondary | ICD-10-CM

## 2024-07-28 DIAGNOSIS — F900 Attention-deficit hyperactivity disorder, predominantly inattentive type: Secondary | ICD-10-CM | POA: Diagnosis not present

## 2024-07-28 DIAGNOSIS — F422 Mixed obsessional thoughts and acts: Secondary | ICD-10-CM | POA: Diagnosis not present

## 2024-07-28 DIAGNOSIS — F3341 Major depressive disorder, recurrent, in partial remission: Secondary | ICD-10-CM | POA: Diagnosis not present

## 2024-07-28 DIAGNOSIS — R7989 Other specified abnormal findings of blood chemistry: Secondary | ICD-10-CM

## 2024-07-28 DIAGNOSIS — F411 Generalized anxiety disorder: Secondary | ICD-10-CM

## 2024-07-28 MED ORDER — METHYLPHENIDATE HCL 20 MG PO TABS: 20.0000 mg | ORAL_TABLET | Freq: Three times a day (TID) | ORAL | 0 refills | Status: AC

## 2024-07-28 NOTE — Progress Notes (Signed)
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 metabolic panel was normal including creatinine 0.83 calcium 9.9 and TSH was normal at 2.83.  Lithium  level in April 2019 was 0.2 at this dosage.  We will not increase the lithium  because she is currently having tremor at the low dose.  .res Assessment: Plan:    Mixed obsessional thoughts and acts  Depression, major, recurrent, in partial remission - Plan: methylphenidate  (RITALIN ) 20 MG tablet  Attention deficit hyperactivity disorder (ADHD), predominantly inattentive type - Plan: methylphenidate  (RITALIN ) 20 MG tablet, methylphenidate  (RITALIN ) 20 MG tablet, methylphenidate  (RITALIN ) 20 MG tablet  Generalized anxiety disorder  Tremor, essential  Tardive dyskinesia  Low serum vitamin D    30-minute face to face time with patient was spent on counseling and coordination of care. We discussed Zamiya has Katrina long history over decades of cycling major depression and treatment resistance to meds noted above of nearly every antidepressant category.  Was more anxious and obsessive with mixed type sx until increase oxcarbazepine  450 mg HS.  Pt has had what she describes as mild hypomanic episodes in the past in response to antidepressants primarily.  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  Not been depressed lately with increase lamotrigine  and less anxiety with Trileptal . Mood cycling worse with lithium  reduction from 300 to 150 mg and better with 300 mg. While she does not identify herself is bipolar because her upswings seem only normal to her.  The cycling nature of her depression is more consistent with Katrina bipolar type depression than it is with major depression which does not typically occur with intermittent periods of several days of feeling euthymic and then back into depression again which is her pattern. Usually November worse month of year.    Overall is much more stable with current med regimen than she has been in the past.  So no changes indicated.  Obsessions she made Katrina mistake which will cause harm to others are better.  Lowest risk mood stabilizing med should be Trileptal .  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   Benefit Trileptal  for anxiety and  somewhat for sleep even better with increase.option increase for mild mood cycling that led into 3 weed dep.  She doesn't want to change. Continue Trileptal  to 450  mg HS for anxiety and insomnia.   Her best response so far has been from low-dose lithium  with Katrina stimulant but lamotrigine  has seemed to help for 6 mos in 2020 until relapse in Summer.  The depression resolved again with the increase in lamotrigine  from 100 to 150 mg daily.  They do not follow any pattern and do not appear to have any precipitant.   Cognitive complaints but not substantially impairing at this time.  Cognition is appropriate for age with no major concerns. Cerefolin NAC for Katrina month with unclear effects. Rec trial Disc the off-label use of N-Acetylcysteine at 600 mg daily to help with mild cognitive problems.  It can be combined with Katrina B-complex vitamin as the B-12 and folate have been shown to sometimes enhance the effect.  Gave her samples in  the past. Option Namenda.  She'd rather not add more meds.  We tried an increase lithium  to 600 mg daily and check Katrina blood level.   The increase in lithium  did not help her mood and her made her tremor worse. Counseled patient regarding potential benefits, risks, and side effects of lithium  to include potential risk of lithium  affecting thyroid  and renal function.  Discussed need for periodic lab monitoring to determine drug level and to assess for potential adverse effects.  Counseled patient regarding signs and symptoms of lithium  toxicity and advised that they notify office immediately or seek urgent medical attention if experiencing these signs and symptoms.  Patient advised to contact office with any questions or concerns.    The tremor predates lithium  and predates stimulant use but appears to have been made worse by the increase in lamotrigine .  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  She saw Katrina neurologist. Does not really want to add meds for tremor.  the increase lamotrigine  to 150 mg daily resolved the depression.  it was helpful for several months at 100. Consider reducing lamotrigine  to see if obs thoughts get better.  Consider increasing for mood.  On lithium  300 mg nightly.  It appears she cannot tolerate Katrina higher dose.  Consider reduction to reduce tremor possibly given improvement from lamotrigine . She wanted to try lower dose lithium  150 mg daily and this did not help tremor and mood cycling is worse, so increase lithium  back to 300 mg daily.  Discussed potential benefits, risks, and side effects of stimulants with patient to include increased heart rate, palpitations, insomnia, increased anxiety, increased irritability, or decreased appetite.  Instructed patient to contact office if experiencing any significant tolerability issues. Pleased she got the [place] generic Ritalin  bc easier to cut.  Ritalin   Prefers [place] generic. She got different brand [facility] of  methylphenidate  and it shatters when tries to quarter it. She would like to try Concerta  54 mg Am  Continue  Vitamin D . Disc reasons for doing so for mental and physical health in detail.  She has stopped bc she hates taking pills.  Disc risk low D and dementia risk.  Supportive therapy dealing with GS's mental health problems.  Disc OCD pathology and severity levels and how it' is treated.  It's been worse latelyDisc pros cons of treating OCD with low dose sertraline bc of it's recent worsening. And she's med sensitive.   She wants defer for now  Labs per PCP [facility].  No problems.  05/2023 level below normal range.  They are checking vitamin D  levels. Low Vit D   lamotrigine  150 mg daily, lithium  300 mg nightly, Trileptal  450 mg nightly,   Switch Ritalin  20 mg 3 times daily to Concerta  54 to improve consistency  FU 4 mos  Lorene Macintosh, MD, DFAPA '   Please see After Visit Summary for patient specific instructions.  Future Appointments  Date Time Provider Department Center  08/31/2024 10:50 AM [facility]  10/28/2024  9:00 AM Cottle, Lorene KANDICE Raddle., MD [facility] None  12/17/2024  9:00 AM Chrzanowski, Jami B, NP [facility] None      No orders of the defined types were placed in this encounter.      -------------------------------

## 2024-08-13 ENCOUNTER — Other Ambulatory Visit: Payer: Self-pay | Admitting: Psychiatry

## 2024-08-13 DIAGNOSIS — F331 Major depressive disorder, recurrent, moderate: Secondary | ICD-10-CM

## 2024-08-27 DIAGNOSIS — E559 Vitamin D deficiency, unspecified: Secondary | ICD-10-CM | POA: Diagnosis not present

## 2024-08-27 DIAGNOSIS — E538 Deficiency of other specified B group vitamins: Secondary | ICD-10-CM | POA: Diagnosis not present

## 2024-08-27 DIAGNOSIS — Z79899 Other long term (current) drug therapy: Secondary | ICD-10-CM | POA: Diagnosis not present

## 2024-08-27 DIAGNOSIS — E039 Hypothyroidism, unspecified: Secondary | ICD-10-CM | POA: Diagnosis not present

## 2024-08-31 ENCOUNTER — Inpatient Hospital Stay: Admission: RE | Admit: 2024-08-31 | Discharge: 2024-08-31 | Attending: Family Medicine

## 2024-08-31 DIAGNOSIS — Z1231 Encounter for screening mammogram for malignant neoplasm of breast: Secondary | ICD-10-CM

## 2024-09-01 ENCOUNTER — Encounter: Payer: Self-pay | Admitting: Psychiatry

## 2024-09-07 ENCOUNTER — Other Ambulatory Visit: Payer: Self-pay | Admitting: Psychiatry

## 2024-09-07 ENCOUNTER — Other Ambulatory Visit: Payer: Self-pay | Admitting: Family Medicine

## 2024-09-07 DIAGNOSIS — F3341 Major depressive disorder, recurrent, in partial remission: Secondary | ICD-10-CM

## 2024-09-07 DIAGNOSIS — R928 Other abnormal and inconclusive findings on diagnostic imaging of breast: Secondary | ICD-10-CM

## 2024-09-16 ENCOUNTER — Ambulatory Visit
Admission: RE | Admit: 2024-09-16 | Discharge: 2024-09-16 | Disposition: A | Discharge status: Home / Self Care | Source: Ambulatory Visit | Attending: Family Medicine | Admitting: Family Medicine

## 2024-09-16 DIAGNOSIS — R928 Other abnormal and inconclusive findings on diagnostic imaging of breast: Secondary | ICD-10-CM

## 2024-09-17 ENCOUNTER — Other Ambulatory Visit: Payer: Self-pay | Admitting: Family Medicine

## 2024-09-17 DIAGNOSIS — R921 Mammographic calcification found on diagnostic imaging of breast: Secondary | ICD-10-CM

## 2024-09-24 ENCOUNTER — Ambulatory Visit
Admission: RE | Admit: 2024-09-24 | Discharge: 2024-09-24 | Disposition: A | Source: Ambulatory Visit | Attending: Family Medicine

## 2024-09-24 ENCOUNTER — Inpatient Hospital Stay: Admission: RE | Admit: 2024-09-24 | Discharge: 2024-09-24 | Attending: Family Medicine | Admitting: Family Medicine

## 2024-09-24 DIAGNOSIS — R921 Mammographic calcification found on diagnostic imaging of breast: Secondary | ICD-10-CM

## 2024-09-24 HISTORY — PX: BREAST BIOPSY: SHX20

## 2024-09-25 LAB — SURGICAL PATHOLOGY

## 2024-09-26 ENCOUNTER — Other Ambulatory Visit: Payer: Self-pay | Admitting: Psychiatry

## 2024-09-26 DIAGNOSIS — F411 Generalized anxiety disorder: Secondary | ICD-10-CM

## 2024-09-26 DIAGNOSIS — F3341 Major depressive disorder, recurrent, in partial remission: Secondary | ICD-10-CM

## 2024-10-28 ENCOUNTER — Ambulatory Visit: Admitting: Psychiatry

## 2024-12-17 ENCOUNTER — Encounter: Admitting: Radiology
